# Patient Record
Sex: Male | Born: 1965
Health system: Southern US, Community
[De-identification: ages and names within clinical notes are randomized; demographics above are authoritative.]

## PROBLEM LIST (undated history)

## (undated) DIAGNOSIS — T1590XA Foreign body on external eye, part unspecified, unspecified eye, initial encounter: Secondary | ICD-10-CM

## (undated) DIAGNOSIS — J189 Pneumonia, unspecified organism: Secondary | ICD-10-CM

## (undated) DIAGNOSIS — N182 Chronic kidney disease, stage 2 (mild): Secondary | ICD-10-CM

## (undated) DIAGNOSIS — I10 Essential (primary) hypertension: Secondary | ICD-10-CM

## (undated) DIAGNOSIS — I5022 Chronic systolic (congestive) heart failure: Secondary | ICD-10-CM

---

## 2005-04-24 ENCOUNTER — Emergency Department (HOSPITAL_COMMUNITY): Admission: EM | Admit: 2005-04-24 | Discharge: 2005-04-24 | Payer: Self-pay | Admitting: Emergency Medicine

## 2007-09-19 DIAGNOSIS — T1590XA Foreign body on external eye, part unspecified, unspecified eye, initial encounter: Secondary | ICD-10-CM

## 2007-09-19 HISTORY — PX: EYE SURGERY: SHX253

## 2007-09-19 HISTORY — DX: Foreign body on external eye, part unspecified, unspecified eye, initial encounter: T15.90XA

## 2007-12-03 ENCOUNTER — Emergency Department (HOSPITAL_COMMUNITY): Admission: EM | Admit: 2007-12-03 | Discharge: 2007-12-03 | Payer: Self-pay | Admitting: Family Medicine

## 2007-12-24 ENCOUNTER — Ambulatory Visit: Payer: Self-pay | Admitting: Internal Medicine

## 2007-12-25 ENCOUNTER — Ambulatory Visit: Payer: Self-pay | Admitting: *Deleted

## 2008-01-16 ENCOUNTER — Ambulatory Visit: Payer: Self-pay | Admitting: Internal Medicine

## 2008-01-16 LAB — CONVERTED CEMR LAB
HDL: 41 mg/dL (ref >39)
Triglycerides: 70 mg/dL (ref <150)
VLDL: 14 mg/dL (ref 0–40)

## 2008-01-24 ENCOUNTER — Ambulatory Visit: Payer: Self-pay | Admitting: Internal Medicine

## 2008-02-21 ENCOUNTER — Ambulatory Visit: Payer: Self-pay | Admitting: Internal Medicine

## 2008-03-05 ENCOUNTER — Encounter: Payer: Self-pay | Admitting: Family Medicine

## 2008-03-05 ENCOUNTER — Ambulatory Visit: Payer: Self-pay | Admitting: Internal Medicine

## 2008-03-05 LAB — CONVERTED CEMR LAB
ALT: 13 units/L (ref 0–53)
AST: 15 units/L (ref 0–37)
Albumin: 4.2 g/dL (ref 3.5–5.2)
BUN: 13 mg/dL (ref 6–23)
Basophils Absolute: 0 10*3/uL (ref 0.0–0.1)
Basophils Relative: 0 % (ref 0–1)
Chloride: 98 meq/L (ref 96–112)
Creatinine, Ser: 1.18 mg/dL (ref 0.40–1.50)
Eosinophils Absolute: 0.1 10*3/uL (ref 0.0–0.7)
Glucose, Bld: 148 mg/dL — ABNORMAL HIGH (ref 70–99)
Hemoglobin: 13.4 g/dL (ref 13.0–17.0)
MCHC: 33.7 g/dL (ref 30.0–36.0)
MCV: 75.1 fL — ABNORMAL LOW (ref 78.0–100.0)
Monocytes Absolute: 0.3 10*3/uL (ref 0.1–1.0)
Monocytes Relative: 6 % (ref 3–12)
Neutro Abs: 2.6 10*3/uL (ref 1.7–7.7)
RDW: 14.6 % (ref 11.5–15.5)

## 2008-03-13 ENCOUNTER — Ambulatory Visit: Payer: Self-pay | Admitting: Internal Medicine

## 2008-07-27 ENCOUNTER — Ambulatory Visit: Payer: Self-pay | Admitting: Internal Medicine

## 2008-09-07 ENCOUNTER — Ambulatory Visit: Payer: Self-pay | Admitting: Internal Medicine

## 2008-11-16 ENCOUNTER — Ambulatory Visit: Payer: Self-pay | Admitting: Internal Medicine

## 2008-11-17 ENCOUNTER — Encounter: Payer: Self-pay | Admitting: Family Medicine

## 2008-11-17 ENCOUNTER — Ambulatory Visit: Payer: Self-pay | Admitting: Internal Medicine

## 2008-11-17 LAB — CONVERTED CEMR LAB
ALT: 20 units/L (ref 0–53)
AST: 18 units/L (ref 0–37)
Albumin: 4.6 g/dL (ref 3.5–5.2)
Calcium: 9.3 mg/dL (ref 8.4–10.5)
Chloride: 95 meq/L — ABNORMAL LOW (ref 96–112)
Creatinine, Ser: 1.13 mg/dL (ref 0.40–1.50)
Potassium: 3.9 meq/L (ref 3.5–5.3)

## 2008-11-25 ENCOUNTER — Ambulatory Visit: Payer: Self-pay | Admitting: Internal Medicine

## 2010-04-05 ENCOUNTER — Ambulatory Visit: Payer: Self-pay | Admitting: Family Medicine

## 2010-04-19 ENCOUNTER — Ambulatory Visit: Payer: Self-pay | Admitting: Internal Medicine

## 2010-04-19 LAB — CONVERTED CEMR LAB
Glucose, Bld: 117 mg/dL — ABNORMAL HIGH (ref 70–99)
Sodium: 134 meq/L — ABNORMAL LOW (ref 135–145)

## 2010-05-03 ENCOUNTER — Encounter (INDEPENDENT_AMBULATORY_CARE_PROVIDER_SITE_OTHER): Payer: Self-pay | Admitting: Internal Medicine

## 2010-05-03 ENCOUNTER — Ambulatory Visit: Payer: Self-pay | Admitting: Internal Medicine

## 2010-05-06 ENCOUNTER — Ambulatory Visit: Payer: Self-pay | Admitting: Internal Medicine

## 2010-05-06 LAB — CONVERTED CEMR LAB
ALT: 25 units/L (ref 0–53)
Albumin: 4.4 g/dL (ref 3.5–5.2)
Basophils Absolute: 0 10*3/uL (ref 0.0–0.1)
Calcium: 9.1 mg/dL (ref 8.4–10.5)
Creatinine, Ser: 1.16 mg/dL (ref 0.40–1.50)
Eosinophils Absolute: 0.1 10*3/uL (ref 0.0–0.7)
Eosinophils Relative: 1 % (ref 0–5)
HCT: 41.4 % (ref 39.0–52.0)
Hemoglobin: 14.7 g/dL (ref 13.0–17.0)
Lymphocytes Relative: 36 % (ref 12–46)
Lymphs Abs: 1.9 10*3/uL (ref 0.7–4.0)
MCHC: 35.5 g/dL (ref 30.0–36.0)
Monocytes Absolute: 0.3 10*3/uL (ref 0.1–1.0)
Monocytes Relative: 5 % (ref 3–12)
Neutro Abs: 3 10*3/uL (ref 1.7–7.7)
Neutrophils Relative %: 58 % (ref 43–77)
Potassium: 3.6 meq/L (ref 3.5–5.3)
Sodium: 133 meq/L — ABNORMAL LOW (ref 135–145)
TSH: 2.065 microintl units/mL (ref 0.350–4.500)
Total Protein: 7.2 g/dL (ref 6.0–8.3)
WBC: 5.2 10*3/uL (ref 4.0–10.5)

## 2012-09-08 ENCOUNTER — Other Ambulatory Visit: Payer: Self-pay | Admitting: *Deleted

## 2012-09-08 MED ORDER — LISINOPRIL-HYDROCHLOROTHIAZIDE 20-12.5 MG PO TABS: 2.0000 | ORAL_TABLET | Freq: Every day | ORAL | Status: DC

## 2012-09-17 ENCOUNTER — Encounter (HOSPITAL_COMMUNITY): Payer: Self-pay | Admitting: *Deleted

## 2012-09-17 ENCOUNTER — Emergency Department (HOSPITAL_COMMUNITY)
Admission: EM | Admit: 2012-09-17 | Discharge: 2012-09-18 | Disposition: A | Discharge status: Home / Self Care | Payer: No Typology Code available for payment source | Attending: Emergency Medicine | Admitting: Emergency Medicine

## 2012-09-17 DIAGNOSIS — I10 Essential (primary) hypertension: Secondary | ICD-10-CM

## 2012-09-17 DIAGNOSIS — Z9119 Patient's noncompliance with other medical treatment and regimen: Secondary | ICD-10-CM | POA: Insufficient documentation

## 2012-09-17 DIAGNOSIS — M25512 Pain in left shoulder: Secondary | ICD-10-CM

## 2012-09-17 DIAGNOSIS — Z91148 Patient's other noncompliance with medication regimen for other reason: Secondary | ICD-10-CM

## 2012-09-17 DIAGNOSIS — S46909A Unspecified injury of unspecified muscle, fascia and tendon at shoulder and upper arm level, unspecified arm, initial encounter: Secondary | ICD-10-CM | POA: Insufficient documentation

## 2012-09-17 DIAGNOSIS — M545 Low back pain, unspecified: Secondary | ICD-10-CM

## 2012-09-17 DIAGNOSIS — Z9114 Patient's other noncompliance with medication regimen: Secondary | ICD-10-CM

## 2012-09-17 DIAGNOSIS — S4980XA Other specified injuries of shoulder and upper arm, unspecified arm, initial encounter: Secondary | ICD-10-CM | POA: Insufficient documentation

## 2012-09-17 DIAGNOSIS — IMO0002 Reserved for concepts with insufficient information to code with codable children: Secondary | ICD-10-CM | POA: Insufficient documentation

## 2012-09-17 DIAGNOSIS — Z91199 Patient's noncompliance with other medical treatment and regimen due to unspecified reason: Secondary | ICD-10-CM | POA: Insufficient documentation

## 2012-09-17 DIAGNOSIS — Y92410 Unspecified street and highway as the place of occurrence of the external cause: Secondary | ICD-10-CM | POA: Insufficient documentation

## 2012-09-17 DIAGNOSIS — R000 Tachycardia, unspecified: Secondary | ICD-10-CM | POA: Insufficient documentation

## 2012-09-17 DIAGNOSIS — Y9389 Activity, other specified: Secondary | ICD-10-CM | POA: Insufficient documentation

## 2012-09-17 HISTORY — DX: Essential (primary) hypertension: I10

## 2012-09-17 MED ORDER — LISINOPRIL 10 MG PO TABS
10.0000 mg | ORAL_TABLET | ORAL | Status: AC
  Administered 2012-09-18: 10 mg via ORAL
  Filled 2012-09-17: qty 1

## 2012-09-17 MED ORDER — HYDROCHLOROTHIAZIDE 12.5 MG PO CAPS
12.5000 mg | ORAL_CAPSULE | ORAL | Status: AC
  Administered 2012-09-18: 12.5 mg via ORAL
  Filled 2012-09-17: qty 1

## 2012-09-17 MED ORDER — ACETAMINOPHEN 325 MG PO TABS
650.0000 mg | ORAL_TABLET | ORAL | Status: AC
  Administered 2012-09-18: 650 mg via ORAL
  Filled 2012-09-17: qty 2
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
## 2012-09-18 ENCOUNTER — Emergency Department (HOSPITAL_COMMUNITY): Payer: No Typology Code available for payment source

## 2012-09-18 MED ORDER — HYDROCHLOROTHIAZIDE 12.5 MG PO CAPS: 12.5000 mg | ORAL_CAPSULE | Freq: Two times a day (BID) | ORAL | Status: DC

## 2012-09-18 MED ORDER — LISINOPRIL 10 MG PO TABS: 10.0000 mg | ORAL_TABLET | Freq: Every day | ORAL | Status: DC

## 2012-09-18 NOTE — ED Notes (Signed)
C Collar and short back board removed by Dr. Norlene Campbell
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
## 2013-01-07 ENCOUNTER — Emergency Department (HOSPITAL_COMMUNITY)
Admission: EM | Admit: 2013-01-07 | Discharge: 2013-01-07 | Disposition: A | Discharge status: Home / Self Care | Payer: Self-pay | Source: Home / Self Care

## 2013-05-06 ENCOUNTER — Encounter (HOSPITAL_COMMUNITY): Payer: Self-pay | Admitting: *Deleted

## 2013-05-06 ENCOUNTER — Emergency Department (HOSPITAL_COMMUNITY)
Admission: EM | Admit: 2013-05-06 | Discharge: 2013-05-07 | Disposition: A | Discharge status: Home / Self Care | Payer: Self-pay | Attending: Emergency Medicine | Admitting: Emergency Medicine

## 2013-05-06 ENCOUNTER — Emergency Department (INDEPENDENT_AMBULATORY_CARE_PROVIDER_SITE_OTHER)
Admission: EM | Admit: 2013-05-06 | Discharge: 2013-05-06 | Disposition: A | Discharge status: Nursing Home (Includes ICF) | Payer: Self-pay | Source: Home / Self Care | Attending: Emergency Medicine | Admitting: Emergency Medicine

## 2013-05-06 DIAGNOSIS — I10 Essential (primary) hypertension: Secondary | ICD-10-CM | POA: Insufficient documentation

## 2013-05-06 DIAGNOSIS — E875 Hyperkalemia: Secondary | ICD-10-CM

## 2013-05-06 DIAGNOSIS — E876 Hypokalemia: Secondary | ICD-10-CM | POA: Insufficient documentation

## 2013-05-06 LAB — POCT I-STAT, CHEM 8
Calcium, Ion: 1.18 mmol/L (ref 1.12–1.23)
Hemoglobin: 16.7 g/dL (ref 13.0–17.0)
Sodium: 135 mEq/L (ref 135–145)
TCO2: 29 mmol/L (ref 0–100)

## 2013-05-06 MED ORDER — CLONIDINE HCL 0.1 MG PO TABS
ORAL_TABLET | ORAL | Status: AC
  Filled 2013-05-06: qty 2

## 2013-05-06 MED ORDER — CLONIDINE HCL 0.1 MG PO TABS: 0.3000 mg | ORAL_TABLET | Freq: Once | ORAL | Status: DC

## 2013-05-06 MED ORDER — POTASSIUM CHLORIDE CRYS ER 20 MEQ PO TBCR
40.0000 meq | EXTENDED_RELEASE_TABLET | Freq: Once | ORAL | Status: AC
  Administered 2013-05-07: 40 meq via ORAL
  Filled 2013-05-06: qty 2

## 2013-05-06 MED ORDER — CLONIDINE HCL 0.1 MG PO TABS
0.2000 mg | ORAL_TABLET | Freq: Once | ORAL | Status: AC
  Administered 2013-05-06: 0.2 mg via ORAL

## 2013-05-06 MED ORDER — FUROSEMIDE 40 MG PO TABS: 40.0000 mg | ORAL_TABLET | Freq: Once | ORAL | Status: DC

## 2013-05-06 NOTE — ED Provider Notes (Addendum)
Chief Complaint:   Chief Complaint  Patient presents with  . Medication Refill    History of Present Illness:   Austin Mccarty is a 47 year old male with 2 year history of high blood pressure. He had been on [facility]. He's not seen him since that clinic closed. The patient states he has not taken any medications at all for the past 3 months. He feels tired and rundown but otherwise has had no other symptoms. Specifically he denies headaches, dizziness, blurry vision, shortness of breath, chest pain, tightness, pressure, palpitations, or ankle edema. He has had no history of diabetes, hypercholesterolemia, kidney disease, stroke, or heart disease.  Review of Systems:  Other than noted above, the patient denies any of the following symptoms: Systemic:  No fever, chills, fatigue, weight loss or gain. Respiratory:  No coughing, wheezing, or shortness of breath. Cardiac:  No chest pain, tightness, pressure, palpitations, syncope, or edema. Neuro:  No headache, dizziness, blurred vision, weakness, paresthesias, or strokelike symptoms.  PMFSH:  Past medical history, family history, social history, meds, and allergies were reviewed.  Physical Exam:   Vital signs:  BP 212/102  Pulse 83  Temp(Src) 99 F (37.2 C) (Oral)  Resp 18  SpO2 97% General:  Alert, oriented, in no distress. Lungs:  Breath sounds clear and equal bilaterally.  No wheezes, rales, or rhonchi. Heart:  Regular rhythm, no gallops, murmers, clicks or rubs.  Abdomen:  Soft and flat.  Nontender, no organomegaly or mass.  No pulsatile midline abdominal mass or bruit. Ext:  No edema, pulses full. Neurological exam:  Alert and oriented.  Speech is clear.  No pronator drift.  CNs intact.  Labs:   Results for orders placed during the hospital encounter of 05/06/13  POCT I-STAT, CHEM 8      Result Value Range   Sodium 135  135 - 145 mEq/L   Potassium 2.9 (*) 3.5 - 5.1 mEq/L   Chloride  95 (*) 96 - 112 mEq/L   BUN 21  6 - 23 mg/dL   Creatinine, Ser 4.09 (*) 0.50 - 1.35 mg/dL   Glucose, Bld 811 (*) 70 - 99 mg/dL   Calcium, Ion 9.14  7.82 - 1.23 mmol/L   TCO2 29  0 - 100 mmol/L   Hemoglobin 16.7  13.0 - 17.0 g/dL   HCT 95.6  21.3 - 08.6 %     Course in Urgent Care Center:   He was given clonidine 0.2 mg by mouth.  Assessment:  The primary encounter diagnosis was Hypertension. A diagnosis of Hyperkalemia was also pertinent to this visit.  I'm concerned about his markedly elevated blood pressure and low potassium, especially since he is not on any medications that should lower his potassium. I wonder if he could have hyperaldosteronism or some type of kidney problem. His creatinine is increased to 1.40. I think he needs in-hospital repletion of his potassium and workup of his hypokalemia. He does not have any followup and no primary care physician.  Plan:   1.  The following meds were prescribed:   New Prescriptions   No medications on file   2.  The patient was transferred to our emergency department via shuttle in stable condition.  Medical Decision Making: Patient has a 2 year history of HT, had been on Benazaipril/HCTZ  Through [facility], but has not received any medical care since they closed.  He states he's been off of all medication for at least  3 months.  Tonight his blood pressure was very high at 212/106, K 2.9 and Cr 1.4.  I am concerned about the low potassium since he's not been on any meds that would lower his potassium.  May have hyperaldosteronism.  Needs K replacement.  I'm reluctant to just give him a K supplement since he has no follow up. Needs potassium repletion and blood pressure control in hospital setting and workup for low potassium.     Reuben Likes, MD 05/06/13 1949  Reuben Likes, MD 05/06/13 [phone]

## 2013-05-06 NOTE — ED Provider Notes (Signed)
CSN: [id number]     Arrival date & time 05/06/13  2011 History     First MD Initiated Contact with Patient 05/06/13 2305     Chief Complaint  Patient presents with  . hypokalemia    . Hypertension   (Consider location/radiation/quality/duration/timing/severity/associated sxs/prior Treatment) HPI Comments: 47 year old male who was sent to the ER from urgent care hypertension and hypokalemia. Patient reported to the urgent care earlier today in order to refill for his blood pressure medications. He denies any symptoms whatsoever, including chest pain, shortness of breath, abdominal pain, blurry vision, headaches.  Patient is a 47 y.o. male presenting with hypertension.  Hypertension This is a chronic problem. The problem occurs constantly. The problem has been gradually worsening. Pertinent negatives include no chest pain, no abdominal pain, no headaches and no shortness of breath. Nothing aggravates the symptoms. Nothing relieves the symptoms. Treatments tried: ran out of benazapril and HCTZ a few weeks ago.      Past Medical History  Diagnosis Date  . Hypertension    Past Surgical History  Procedure Laterality Date  . Eye surgery     No family history on file. History  Substance Use Topics  . Smoking status: Never Smoker   . Smokeless tobacco: Current User  . Alcohol Use: No    Review of Systems  Constitutional: Negative for fever.  HENT: Negative for congestion.   Respiratory: Negative for cough and shortness of breath.   Cardiovascular: Negative for chest pain.  Gastrointestinal: Negative for nausea, vomiting, abdominal pain and diarrhea.  Neurological: Negative for headaches.  All other systems reviewed and are negative.    Allergies  Ibuprofen  Home Medications  No current outpatient prescriptions on file. BP 182/111  Pulse 69  Temp(Src) 98.4 F (36.9 C) (Oral)  Resp 18  SpO2 100% Physical Exam  Nursing note and vitals reviewed. Constitutional: He is  oriented to person, place, and time. He appears well-developed and well-nourished. No distress.  HENT:  Head: Normocephalic and atraumatic.  Mouth/Throat: Oropharynx is clear and moist.  Eyes: Conjunctivae are normal. Pupils are equal, round, and reactive to light. No scleral icterus.  Neck: Neck supple.  Cardiovascular: Normal rate, regular rhythm, normal heart sounds and intact distal pulses.   No murmur heard. Pulmonary/Chest: Effort normal and breath sounds normal. No stridor. No respiratory distress. He has no wheezes. He has no rales.  Abdominal: Soft. He exhibits no distension. There is no tenderness.  Musculoskeletal: Normal range of motion. He exhibits no edema.  Neurological: He is alert and oriented to person, place, and time.  Dysconjugate gaze is old from a prior right eye surgery  Skin: Skin is warm and dry. No rash noted.  Psychiatric: He has a normal mood and affect. His behavior is normal.    ED Course   Procedures (including critical care time)  Labs Reviewed  BASIC METABOLIC PANEL - Abnormal; Notable for the following:    Sodium 133 (*)    Potassium 2.8 (*)    Chloride 95 (*)    Glucose, Bld 112 (*)    GFR calc non Af Amer 67 (*)    GFR calc Af Amer 77 (*)    All other components within normal limits   No results found.  EKG - sinus, rate 73, normal axis, normal intervals, LVH, ST changes consistent with repolarization abnormality, no priors available.    1. Hypokalemia   2. Hypertension     MDM  Presented from urgent care for HTN  and hypokalemia.  Pt has no symptoms, he just wanted to get a refill of his benazepril.  No signs of acute end organ damage.  Repeat BMP confirmed hypokalemia.  Replaced orally and recommended high potassium diet and close PCP follow up.  He declined further testing, IV K replacement, or inpatient management.    Candyce Churn, MD 05/08/13 [phone]

## 2013-05-06 NOTE — ED Notes (Signed)
Pt has  History  Of  htn  -  He  Is  A  Former  Scientist, water quality  Pt  Who  Has  Not  Been  Compliant       He  Reports  No bp  meds  For  3  Months   -  He  Verbalizes  No  Other  Symptoms

## 2013-05-06 NOTE — ED Notes (Signed)
Pt states that he went to she his doctor, and his labs show low potassium serum level of 2.9

## 2013-05-07 LAB — BASIC METABOLIC PANEL
Calcium: 8.6 mg/dL (ref 8.4–10.5)
GFR calc Af Amer: 77 mL/min — ABNORMAL LOW (ref >90)
Potassium: 2.8 mEq/L — ABNORMAL LOW (ref 3.5–5.1)

## 2013-05-07 MED ORDER — BENAZEPRIL HCL 20 MG PO TABS: 20.0000 mg | ORAL_TABLET | Freq: Every day | ORAL | Status: DC

## 2013-05-21 ENCOUNTER — Ambulatory Visit: Payer: Self-pay | Admitting: Cardiology

## 2014-10-14 ENCOUNTER — Encounter (HOSPITAL_COMMUNITY): Payer: Self-pay

## 2014-10-14 ENCOUNTER — Emergency Department (INDEPENDENT_AMBULATORY_CARE_PROVIDER_SITE_OTHER)
Admission: EM | Admit: 2014-10-14 | Discharge: 2014-10-14 | Disposition: A | Discharge status: Home / Self Care | Payer: Self-pay | Source: Home / Self Care | Attending: Emergency Medicine | Admitting: Emergency Medicine

## 2014-10-14 DIAGNOSIS — J40 Bronchitis, not specified as acute or chronic: Secondary | ICD-10-CM

## 2014-10-14 LAB — POCT RAPID STREP A: Streptococcus, Group A Screen (Direct): NEGATIVE

## 2014-10-14 MED ORDER — AZITHROMYCIN 250 MG PO TABS: ORAL_TABLET | ORAL | Status: DC

## 2014-10-14 NOTE — Discharge Instructions (Signed)
You have bronchitis. Take azithromycin as prescribed. Follow-up if you are not improved after finishing the medication.

## 2014-10-14 NOTE — ED Provider Notes (Signed)
CSN: [id number]     Arrival date & time 10/14/14  1533 History   None    Chief Complaint  Patient presents with  . Sore Throat   (Consider location/radiation/quality/duration/timing/severity/associated sxs/prior Treatment) HPI  He is a 49 year old man here for evaluation of sore throat. This started about one week ago. It is associated with cough. No nasal discharge or rhinorrhea. No fevers or chills. No nausea or vomiting. He states his son was sick with similar symptoms and improved after an antibiotic.   Past Medical History  Diagnosis Date  . Hypertension    Past Surgical History  Procedure Laterality Date  . Eye surgery     History reviewed. No pertinent family history. History  Substance Use Topics  . Smoking status: Never Smoker   . Smokeless tobacco: Current User  . Alcohol Use: No    Review of Systems As in history of present illness Allergies  Ibuprofen  Home Medications   Prior to Admission medications   Medication Sig Start Date End Date Taking? Authorizing Provider  azithromycin (ZITHROMAX Z-PAK) 250 MG tablet Take 2 pills today, then 1 pill daily until gone 10/14/14   Charm RingsErin J Demetre Monaco, MD  benazepril (LOTENSIN) 20 MG tablet Take 1 tablet (20 mg total) by mouth daily. 05/07/13   Candyce ChurnJohn David Wofford III, MD   BP 185/100 mmHg  Pulse 86  Temp(Src) 98.3 F (36.8 C) (Oral)  Resp 16  SpO2 98% Physical Exam  Constitutional: He is oriented to person, place, and time. He appears well-developed and well-nourished. No distress.  HENT:  Head: Normocephalic and atraumatic.  Right Ear: Tympanic membrane normal.  Left Ear: Tympanic membrane normal.  Nose: Nose normal.  Mouth/Throat: Posterior oropharyngeal erythema present. No oropharyngeal exudate.  Neck: Neck supple.  Cardiovascular: Normal rate and normal heart sounds.   No murmur heard. Pulmonary/Chest: Effort normal and breath sounds normal. No respiratory distress. He has no wheezes. He has no rales.   Lymphadenopathy:    He has no cervical adenopathy.  Neurological: He is alert and oriented to person, place, and time.    ED Course  Procedures (including critical care time) Labs Review Labs Reviewed  POCT RAPID STREP A ([facility] ONLY)    Imaging Review No results found.   MDM   1. Bronchitis    We'll treat with azithromycin. Follow-up as needed.    Charm RingsErin J Danielle Mink, MD 10/14/14 [phone]

## 2014-10-14 NOTE — ED Notes (Signed)
C/o sore throat x 1 week.

## 2014-10-16 LAB — CULTURE, GROUP A STREP

## 2015-01-07 ENCOUNTER — Inpatient Hospital Stay (HOSPITAL_BASED_OUTPATIENT_CLINIC_OR_DEPARTMENT_OTHER)
Admission: EM | Admit: 2015-01-07 | Discharge: 2015-01-09 | DRG: 871 | Disposition: A | Discharge status: Home / Self Care | Payer: Self-pay | Attending: Internal Medicine | Admitting: Internal Medicine

## 2015-01-07 ENCOUNTER — Emergency Department (HOSPITAL_BASED_OUTPATIENT_CLINIC_OR_DEPARTMENT_OTHER): Payer: Self-pay

## 2015-01-07 ENCOUNTER — Encounter (HOSPITAL_BASED_OUTPATIENT_CLINIC_OR_DEPARTMENT_OTHER): Payer: Self-pay | Admitting: *Deleted

## 2015-01-07 DIAGNOSIS — E876 Hypokalemia: Secondary | ICD-10-CM | POA: Diagnosis present

## 2015-01-07 DIAGNOSIS — N189 Chronic kidney disease, unspecified: Secondary | ICD-10-CM | POA: Diagnosis present

## 2015-01-07 DIAGNOSIS — S2239XA Fracture of one rib, unspecified side, initial encounter for closed fracture: Secondary | ICD-10-CM | POA: Diagnosis present

## 2015-01-07 DIAGNOSIS — E669 Obesity, unspecified: Secondary | ICD-10-CM | POA: Diagnosis present

## 2015-01-07 DIAGNOSIS — J189 Pneumonia, unspecified organism: Secondary | ICD-10-CM | POA: Diagnosis present

## 2015-01-07 DIAGNOSIS — R05 Cough: Secondary | ICD-10-CM

## 2015-01-07 DIAGNOSIS — E871 Hypo-osmolality and hyponatremia: Secondary | ICD-10-CM | POA: Diagnosis present

## 2015-01-07 DIAGNOSIS — E878 Other disorders of electrolyte and fluid balance, not elsewhere classified: Secondary | ICD-10-CM

## 2015-01-07 DIAGNOSIS — A419 Sepsis, unspecified organism: Principal | ICD-10-CM | POA: Diagnosis present

## 2015-01-07 DIAGNOSIS — I129 Hypertensive chronic kidney disease with stage 1 through stage 4 chronic kidney disease, or unspecified chronic kidney disease: Secondary | ICD-10-CM | POA: Diagnosis present

## 2015-01-07 DIAGNOSIS — R059 Cough, unspecified: Secondary | ICD-10-CM

## 2015-01-07 DIAGNOSIS — N182 Chronic kidney disease, stage 2 (mild): Secondary | ICD-10-CM | POA: Diagnosis present

## 2015-01-07 DIAGNOSIS — Z888 Allergy status to other drugs, medicaments and biological substances status: Secondary | ICD-10-CM

## 2015-01-07 DIAGNOSIS — Z6826 Body mass index (BMI) 26.0-26.9, adult: Secondary | ICD-10-CM

## 2015-01-07 DIAGNOSIS — N179 Acute kidney failure, unspecified: Secondary | ICD-10-CM | POA: Diagnosis present

## 2015-01-07 HISTORY — DX: Chronic kidney disease, stage 2 (mild): N18.2

## 2015-01-07 HISTORY — DX: Pneumonia, unspecified organism: J18.9

## 2015-01-07 HISTORY — DX: Foreign body on external eye, part unspecified, unspecified eye, initial encounter: T15.90XA

## 2015-01-07 LAB — CBC
HCT: 35.7 % — ABNORMAL LOW (ref 39.0–52.0)
HEMOGLOBIN: 12.4 g/dL — AB (ref 13.0–17.0)
MCH: 23.9 pg — AB (ref 26.0–34.0)
MCHC: 34.7 g/dL (ref 30.0–36.0)
MCV: 68.8 fL — AB (ref 78.0–100.0)
Platelets: 328 10*3/uL (ref 150–400)
RBC: 5.19 MIL/uL (ref 4.22–5.81)
RDW: 14.7 % (ref 11.5–15.5)
WBC: 12.5 10*3/uL — ABNORMAL HIGH (ref 4.0–10.5)

## 2015-01-07 LAB — BASIC METABOLIC PANEL
ANION GAP: 10 (ref 5–15)
BUN: 23 mg/dL (ref 6–23)
CHLORIDE: 89 mmol/L — AB (ref 96–112)
CO2: 29 mmol/L (ref 19–32)
CREATININE: 1.58 mg/dL — AB (ref 0.50–1.35)
Calcium: 8.1 mg/dL — ABNORMAL LOW (ref 8.4–10.5)
GFR calc Af Amer: 59 mL/min — ABNORMAL LOW (ref >90)
GFR calc non Af Amer: 51 mL/min — ABNORMAL LOW (ref >90)
GLUCOSE: 107 mg/dL — AB (ref 70–99)
POTASSIUM: 2.9 mmol/L — AB (ref 3.5–5.1)
Sodium: 128 mmol/L — ABNORMAL LOW (ref 135–145)

## 2015-01-07 LAB — BRAIN NATRIURETIC PEPTIDE: B Natriuretic Peptide: 551.8 pg/mL — ABNORMAL HIGH (ref 0.0–100.0)

## 2015-01-07 MED ORDER — CIPROFLOXACIN IN D5W 400 MG/200ML IV SOLN
400.0000 mg | Freq: Once | INTRAVENOUS | Status: AC
  Administered 2015-01-07: 400 mg via INTRAVENOUS
  Filled 2015-01-07: qty 200

## 2015-01-07 MED ORDER — HYDROCOD POLST-CPM POLST ER 10-8 MG/5ML PO SUER
ORAL | Status: AC
  Administered 2015-01-07: 5 mL
  Filled 2015-01-07: qty 5

## 2015-01-07 MED ORDER — SODIUM CHLORIDE 0.9 % IV BOLUS (SEPSIS)
1000.0000 mL | Freq: Once | INTRAVENOUS | Status: AC
  Administered 2015-01-07: 1000 mL via INTRAVENOUS

## 2015-01-07 MED ORDER — LEVOFLOXACIN IN D5W 750 MG/150ML IV SOLN
750.0000 mg | Freq: Once | INTRAVENOUS | Status: DC
  Administered 2015-01-07: 750 mg via INTRAVENOUS
  Filled 2015-01-07: qty 150

## 2015-01-07 NOTE — ED Notes (Signed)
Cough and fever for 2 months. He has been seen by his MD and given antibiotics for allergies with no improvement.

## 2015-01-07 NOTE — ED Provider Notes (Signed)
CSN: [id number]     Arrival date & time 01/07/15  1717 History  This chart was scribed for Elwin Mocha, MD by Gwenyth Ober, ED Scribe. This patient was seen in room MH03/MH03 and the patient's care was started at [facility].    Chief Complaint  Patient presents with  . Cough   Patient is a 49 y.o. male presenting with cough. The history is provided by the patient. No language interpreter was used.  Cough Cough characteristics:  Productive Sputum characteristics:  White Severity:  Moderate Onset quality:  Gradual Duration:  8 weeks Timing:  Intermittent Progression:  Unchanged Chronicity:  New Smoker: no   Relieved by:  Nothing Worsened by:  Nothing tried Ineffective treatments: Zithromax. Associated symptoms: fever and rhinorrhea   Associated symptoms: no chest pain     HPI Comments: Austin Mccarty is a 49 y.o. male with a history of HTN who presents to the Emergency Department complaining of intermittent, moderate productive cough with white sputum that started 2 months ago. Pt states fever and rhinorrhea as associated symptoms. He has tried a natural inhaler with no relief. Pt was evaluated by his PCP 2 months ago who prescribed Zithromax. He states symptoms improved briefly before worsening again. Pt does not take any regular medication. He denies a history of surgeries and tobacco use. Pt also denies CP, nausea and vomiting as associated symptoms.  Past Medical History  Diagnosis Date  . Hypertension    Past Surgical History  Procedure Laterality Date  . Eye surgery     No family history on file. History  Substance Use Topics  . Smoking status: Never Smoker   . Smokeless tobacco: Current User  . Alcohol Use: No    Review of Systems  Constitutional: Positive for fever.  HENT: Positive for rhinorrhea.   Respiratory: Positive for cough.   Cardiovascular: Negative for chest pain.  Gastrointestinal: Negative for nausea and vomiting.  All other systems reviewed and  are negative.  Allergies  Ibuprofen  Home Medications   Prior to Admission medications   Medication Sig Start Date End Date Taking? Authorizing Provider  azithromycin (ZITHROMAX Z-PAK) 250 MG tablet Take 2 pills today, then 1 pill daily until gone 10/14/14   Charm Rings, MD  benazepril (LOTENSIN) 20 MG tablet Take 1 tablet (20 mg total) by mouth daily. 05/07/13   Blake Divine, MD   BP 185/99 mmHg  Pulse 105  Temp(Src) 99.9 F (37.7 C) (Oral)  Resp 20  Ht  (1.854 m)  Wt 200 lb (90.719 kg)  BMI 26.39 kg/m2  SpO2 92% Physical Exam  Constitutional: He appears well-developed and well-nourished. No distress.  HENT:  Head: Normocephalic and atraumatic.  Eyes: Conjunctivae and EOM are normal.  Neck: Neck supple. No tracheal deviation present.  Cardiovascular: Normal rate.   Pulmonary/Chest: Effort normal. No respiratory distress.  Skin: Skin is warm and dry. No rash noted.  Psychiatric: He has a normal mood and affect. His behavior is normal.  Nursing note and vitals reviewed.   ED Course  Procedures   DIAGNOSTIC STUDIES: Oxygen Saturation is 92% on RA, low by my interpretation.    COORDINATION OF CARE: 6:47 PM Discussed treatment plan with pt which includes a chest x-ray. Pt agreed to plan.  7:46 PM Discussed x-ray results and admission with pt. Will order lab work. Pt agreed to plan.   Labs Review Labs Reviewed  CBC - Abnormal; Notable for the following:    WBC 12.5 (*)  Hemoglobin 12.4 (*)    HCT 35.7 (*)    MCV 68.8 (*)    MCH 23.9 (*)    All other components within normal limits  BASIC METABOLIC PANEL - Abnormal; Notable for the following:    Sodium 128 (*)    Potassium 2.9 (*)    Chloride 89 (*)    Glucose, Bld 107 (*)    Creatinine, Ser 1.58 (*)    Calcium 8.1 (*)    GFR calc non Af Amer 51 (*)    GFR calc Af Amer 59 (*)    All other components within normal limits  BRAIN NATRIURETIC PEPTIDE - Abnormal; Notable for the following:    B Natriuretic  Peptide 551.8 (*)    All other components within normal limits  CULTURE, BLOOD (ROUTINE X 2)  CULTURE, BLOOD (ROUTINE X 2)  LEGIONELLA ANTIGEN, URINE    Imaging Review Dg Chest 2 View  01/07/2015   CLINICAL DATA:  Intermittent moderate productive cough with fever.  EXAM: CHEST  2 VIEW  COMPARISON:  None.  FINDINGS: Borderline enlarged cardiac silhouette and mediastinal contours. There are extensive ill-defined heterogeneous airspace opacities about the bilateral pulmonary hila with relative sparing of the long periphery. No pleural effusion or pneumothorax. No acute osseus abnormalities.  IMPRESSION: Extensive perihilar predominant heterogeneous airspace opacities - differential considerations are broad and include perihilar alveolar pulmonary edema and inhalational injury, though multifocal infection, including atypical etiologies could have a similar appearance. A follow-up chest radiograph in 3 to 4 weeks after treatment is recommended to ensure resolution.   Electronically Signed   By: Simonne ComeJohn  Watts M.D.   On: 01/07/2015 19:18     EKG Interpretation None      MDM   Final diagnoses:  Cough  Community acquired pneumonia  Hyponatremia  Hypokalemia  Hypochloremia    [id number] year old male here with cough for 2 months. Was originally prescribed Zithromax but has not improved since then. Cough productive of white sputum. He is not a smoker and only has history of hypertension. He tried a natural inhaler that was over-the-counter but it did not improve his symptoms. No vomiting, chest pain, shortness of breath. Here lungs are clear. Will x-ray his chest. Xray shows multifocal pneumonia. Labs show hyponatremia, hypokalemia, hypochloremia. Plan for admission. Cipro given per Dr. Sharyon MedicusHijazi request in case this is Legionella.  I personally performed the services described in this documentation, which was scribed in my presence. The recorded information has been reviewed and is accurate.  \   Elwin MochaBlair  Katleen Carraway, MD 01/08/15 [phone]

## 2015-01-08 ENCOUNTER — Inpatient Hospital Stay (HOSPITAL_COMMUNITY): Payer: Self-pay

## 2015-01-08 ENCOUNTER — Encounter (HOSPITAL_COMMUNITY): Payer: Self-pay | Admitting: Internal Medicine

## 2015-01-08 DIAGNOSIS — E876 Hypokalemia: Secondary | ICD-10-CM | POA: Diagnosis present

## 2015-01-08 DIAGNOSIS — I10 Essential (primary) hypertension: Secondary | ICD-10-CM

## 2015-01-08 DIAGNOSIS — N179 Acute kidney failure, unspecified: Secondary | ICD-10-CM

## 2015-01-08 DIAGNOSIS — J189 Pneumonia, unspecified organism: Secondary | ICD-10-CM

## 2015-01-08 DIAGNOSIS — N189 Chronic kidney disease, unspecified: Secondary | ICD-10-CM

## 2015-01-08 DIAGNOSIS — A419 Sepsis, unspecified organism: Principal | ICD-10-CM

## 2015-01-08 HISTORY — DX: Pneumonia, unspecified organism: J18.9

## 2015-01-08 LAB — INFLUENZA PANEL BY PCR (TYPE A & B)
H1N1 flu by pcr: NOT DETECTED
INFLBPCR: NEGATIVE
Influenza A By PCR: NEGATIVE

## 2015-01-08 LAB — LACTIC ACID, PLASMA
LACTIC ACID, VENOUS: 0.8 mmol/L (ref 0.5–2.0)
Lactic Acid, Venous: 0.8 mmol/L (ref 0.5–2.0)

## 2015-01-08 LAB — URINALYSIS, ROUTINE W REFLEX MICROSCOPIC
Bilirubin Urine: NEGATIVE
GLUCOSE, UA: NEGATIVE mg/dL
KETONES UR: NEGATIVE mg/dL
Leukocytes, UA: NEGATIVE
NITRITE: NEGATIVE
Protein, ur: 30 mg/dL — AB
Specific Gravity, Urine: 1.01 (ref 1.005–1.030)
Urobilinogen, UA: 0.2 mg/dL (ref 0.0–1.0)
pH: 7 (ref 5.0–8.0)

## 2015-01-08 LAB — D-DIMER, QUANTITATIVE: D-Dimer, Quant: 0.47 ug/mL-FEU (ref 0.00–0.48)

## 2015-01-08 LAB — BASIC METABOLIC PANEL
ANION GAP: 10 (ref 5–15)
BUN: 17 mg/dL (ref 6–23)
CHLORIDE: 91 mmol/L — AB (ref 96–112)
CO2: 28 mmol/L (ref 19–32)
Calcium: 7.9 mg/dL — ABNORMAL LOW (ref 8.4–10.5)
Creatinine, Ser: 1.32 mg/dL (ref 0.50–1.35)
GFR calc Af Amer: 73 mL/min — ABNORMAL LOW (ref >90)
GFR calc non Af Amer: 63 mL/min — ABNORMAL LOW (ref >90)
GLUCOSE: 126 mg/dL — AB (ref 70–99)
Potassium: 2.8 mmol/L — ABNORMAL LOW (ref 3.5–5.1)
SODIUM: 129 mmol/L — AB (ref 135–145)

## 2015-01-08 LAB — MAGNESIUM: Magnesium: 1.7 mg/dL (ref 1.5–2.5)

## 2015-01-08 LAB — PROTIME-INR
INR: 1.36 (ref 0.00–1.49)
INR: 1.4 (ref 0.00–1.49)
Prothrombin Time: 16.9 seconds — ABNORMAL HIGH (ref 11.6–15.2)
Prothrombin Time: 17.3 seconds — ABNORMAL HIGH (ref 11.6–15.2)

## 2015-01-08 LAB — APTT: APTT: 41 s — AB (ref 24–37)

## 2015-01-08 LAB — CBC
HCT: 33.1 % — ABNORMAL LOW (ref 39.0–52.0)
Hemoglobin: 11.3 g/dL — ABNORMAL LOW (ref 13.0–17.0)
MCH: 23.6 pg — AB (ref 26.0–34.0)
MCHC: 34.1 g/dL (ref 30.0–36.0)
MCV: 69.1 fL — ABNORMAL LOW (ref 78.0–100.0)
PLATELETS: 269 10*3/uL (ref 150–400)
RBC: 4.79 MIL/uL (ref 4.22–5.81)
RDW: 14.5 % (ref 11.5–15.5)
WBC: 9.5 10*3/uL (ref 4.0–10.5)

## 2015-01-08 LAB — RAPID URINE DRUG SCREEN, HOSP PERFORMED
AMPHETAMINES: NOT DETECTED
Barbiturates: NOT DETECTED
Benzodiazepines: NOT DETECTED
Cocaine: NOT DETECTED
OPIATES: POSITIVE — AB
Tetrahydrocannabinol: NOT DETECTED

## 2015-01-08 LAB — HIV ANTIBODY (ROUTINE TESTING W REFLEX): HIV Screen 4th Generation wRfx: NONREACTIVE

## 2015-01-08 LAB — URINE MICROSCOPIC-ADD ON

## 2015-01-08 LAB — PROCALCITONIN: Procalcitonin: 0.27 ng/mL

## 2015-01-08 LAB — STREP PNEUMONIAE URINARY ANTIGEN: Strep Pneumo Urinary Antigen: NEGATIVE

## 2015-01-08 LAB — SODIUM, URINE, RANDOM: Sodium, Ur: 53 mmol/L

## 2015-01-08 LAB — CREATININE, URINE, RANDOM: Creatinine, Urine: 56.07 mg/dL

## 2015-01-08 MED ORDER — HYDRALAZINE HCL 20 MG/ML IJ SOLN
5.0000 mg | INTRAMUSCULAR | Status: DC | PRN
  Filled 2015-01-08: qty 1

## 2015-01-08 MED ORDER — HYDRALAZINE HCL 20 MG/ML IJ SOLN
10.0000 mg | Freq: Four times a day (QID) | INTRAMUSCULAR | Status: DC | PRN
  Administered 2015-01-08: 10 mg via INTRAVENOUS
  Filled 2015-01-08: qty 1

## 2015-01-08 MED ORDER — SODIUM CHLORIDE 0.9 % IV SOLN
INTRAVENOUS | Status: DC
  Administered 2015-01-08: 03:00:00 via INTRAVENOUS

## 2015-01-08 MED ORDER — DEXTROSE 5 % IV SOLN
500.0000 mg | INTRAVENOUS | Status: DC
  Administered 2015-01-08: 500 mg via INTRAVENOUS
  Filled 2015-01-08 (×2): qty 500

## 2015-01-08 MED ORDER — AMLODIPINE BESYLATE 10 MG PO TABS
10.0000 mg | ORAL_TABLET | Freq: Every day | ORAL | Status: DC
  Administered 2015-01-09: 10 mg via ORAL
  Filled 2015-01-08: qty 1

## 2015-01-08 MED ORDER — ACETAMINOPHEN 325 MG PO TABS: 650.0000 mg | ORAL_TABLET | Freq: Four times a day (QID) | ORAL | Status: DC | PRN

## 2015-01-08 MED ORDER — SODIUM CHLORIDE 0.9 % IV SOLN: INTRAVENOUS | Status: DC

## 2015-01-08 MED ORDER — POTASSIUM CHLORIDE CRYS ER 20 MEQ PO TBCR
40.0000 meq | EXTENDED_RELEASE_TABLET | Freq: Once | ORAL | Status: AC
  Administered 2015-01-08: 40 meq via ORAL
  Filled 2015-01-08: qty 2

## 2015-01-08 MED ORDER — ACETAMINOPHEN 500 MG PO TABS
1000.0000 mg | ORAL_TABLET | Freq: Once | ORAL | Status: AC
  Administered 2015-01-08: 1000 mg via ORAL

## 2015-01-08 MED ORDER — AMLODIPINE BESYLATE 5 MG PO TABS
5.0000 mg | ORAL_TABLET | Freq: Every day | ORAL | Status: DC
  Administered 2015-01-08: 5 mg via ORAL
  Filled 2015-01-08: qty 1

## 2015-01-08 MED ORDER — HEPARIN SODIUM (PORCINE) 5000 UNIT/ML IJ SOLN
5000.0000 [IU] | Freq: Three times a day (TID) | INTRAMUSCULAR | Status: DC
  Administered 2015-01-08 – 2015-01-09 (×5): 5000 [IU] via SUBCUTANEOUS
  Filled 2015-01-08 (×4): qty 1

## 2015-01-08 MED ORDER — POTASSIUM CHLORIDE CRYS ER 20 MEQ PO TBCR
40.0000 meq | EXTENDED_RELEASE_TABLET | Freq: Four times a day (QID) | ORAL | Status: AC
  Administered 2015-01-08 (×2): 40 meq via ORAL
  Filled 2015-01-08 (×2): qty 2

## 2015-01-08 MED ORDER — ACETAMINOPHEN 500 MG PO TABS
ORAL_TABLET | ORAL | Status: AC
  Filled 2015-01-08: qty 2

## 2015-01-08 MED ORDER — CEFTRIAXONE SODIUM IN DEXTROSE 20 MG/ML IV SOLN
1.0000 g | INTRAVENOUS | Status: DC
  Administered 2015-01-08 – 2015-01-09 (×2): 1 g via INTRAVENOUS
  Filled 2015-01-08 (×3): qty 50

## 2015-01-08 NOTE — Progress Notes (Signed)
[facility] PROGRESS NOTE  Austin Mccarty ZOX:[id number] DOB: Dec 15, 1965 DOA: 01/07/2015 PCP: PROVIDER NOT IN SYSTEM  Brief narrative 49 year old obese male with hypertension, (not taking medications),? Chronic kidney disease stage II who presented with cough for almost 2 months and treated with azithromycin at the urgent care. Patient presented with sepsis with fever of 101.40F, tachycardia and mild leukocytosis with finding of extensive perihilar predominant heterogeneous airspace opacities. Patient admitted for sepsis due to pneumonia. Patient works as a Naval architect with long distance travel. He has not been out of country since august 2015 ( went to [place]). denies weight loss, hemoptysis or calf swelling. Denies sick contact,unprotected sex, IVDU or weight loss..   Assessment/Plan: Sepsis due to pneumonia Patient on empiric Rocephin and azithromycin. Sepsis protocol initiated in the ED. Vitals currently stable. Lactic acid normal. Follow blood culture, sputum culture, urine strep antigen and Legionella. HIVab. Flu PCR negative. Given his extensive bilateral opacities and possible alveolar edema on chest x-ray will obtain CT scan without contrast to further evaluate. Check d dimer.  Acute vs acute on chr kidney injury Avoid nephrotoxins. Will hold lisinopril. Check UA and urine drug screen. Renal ultrasound suggests medical renal disease.    Uncontrolled hypertension Patient not taking blood pressure medications for past 2 months. Placed on amlodipine and when necessary hydralazine. Counseled on diet to the adherence and medication compliance  ? Volume overload Mildly elevated BNP with trace leg edmea. Check 2 d echo    Hyponatremia Possibly related to pneumonia. Monitor in a.m. lab   hypokalemia Replenish with KCl Follow-up in a.m. labs.    DVT Prophylaxis: Subcutaneous heparin  Diet: Low sodium   Code Status: Full code Family Communication: None at bedside   Disposition Plan: Currently inpatient.   Consultants:  NONE  Procedures:  2 D ECHO  CT CHEST  Antibiotics:  IV Rocephin and azithromycin since 4/22  HPI/Subjective: Seen and xamned. Admission H&P reviewed. C/o congestion and productive cough . No hemoptysis  Objective: Filed Vitals:   01/08/15 0619  BP: 189/105  Pulse:   Temp: 97.8 F (36.6 C)  Resp:     Intake/Output Summary (Last 24 hours) at 01/08/15 1022 Last data filed at 01/08/15 0945  Gross per 24 hour  Intake    120 ml  Output      0 ml  Net    120 ml   Filed Weights   01/07/15 1722 01/08/15 0224  Weight: 90.719 kg (200 lb) 119.4 kg (263 lb 3.7 oz)    Exam:   General:  Middle aged obese male in no acute distress  HEENT: No pallor, nasal congestion, moist mucosa, supple neck, no cervical lymphadenopathy  Chest: Coarse breath sounds bilaterally, no rhonchi or wheeze  CVS: Normal S1 and S2, no murmurs rub or gallop  GI: Soft, nondistended, nontender, bowel sounds present  Musculoskeletal: Warm, trace edema  CNS: Alert and oriented   Data Reviewed: Basic Metabolic Panel:  Recent Labs Lab 01/07/15 2010  NA 128*  K 2.9*  CL 89*  CO2 29  GLUCOSE 107*  BUN 23  CREATININE 1.58*  CALCIUM 8.1*   Liver Function Tests: No results for input(s): AST, ALT, ALKPHOS, BILITOT, PROT, ALBUMIN in the last 168 hours. No results for input(s): LIPASE, AMYLASE in the last 168 hours. No results for input(s): AMMONIA in the last 168 hours. CBC:  Recent Labs Lab 01/07/15 2010  WBC 12.5*  HGB 12.4*  HCT 35.7*  MCV 68.8*  PLT 328  Cardiac Enzymes: No results for input(s): CKTOTAL, CKMB, CKMBINDEX, TROPONINI in the last 168 hours. BNP (last 3 results)  Recent Labs  01/07/15 2010  BNP 551.8*    ProBNP (last 3 results) No results for input(s): PROBNP in the last 8760 hours.  CBG: No results for input(s): GLUCAP in the last 168 hours.  Recent Results (from the past 240 hour(s))   Culture, blood (routine x 2)     Status: None (Preliminary result)   Collection Time: 01/07/15  8:10 PM  Result Value Ref Range Status   Specimen Description BLOOD RIGHT AC  Final   Special Requests BOTTLES DRAWN AEROBIC AND ANAEROBIC 5CC EACH  Final   Culture   Final           BLOOD CULTURE RECEIVED NO GROWTH TO DATE CULTURE WILL BE HELD FOR 5 DAYS BEFORE ISSUING A FINAL NEGATIVE REPORT [facility]    Report Status PENDING  Incomplete  Culture, blood (routine x 2)     Status: None (Preliminary result)   Collection Time: 01/07/15  8:15 PM  Result Value Ref Range Status   Specimen Description BLOOD RIGHT HAND  Final   Special Requests BOTTLES DRAWN AEROBIC AND ANAEROBIC 5CC EACH  Final   Culture   Final           BLOOD CULTURE RECEIVED NO GROWTH TO DATE CULTURE WILL BE HELD FOR 5 DAYS BEFORE ISSUING A FINAL NEGATIVE REPORT Performed at [facility]    Report Status PENDING  Incomplete     Studies: Dg Chest 2 View  01/07/2015   CLINICAL DATA:  Intermittent moderate productive cough with fever.  EXAM: CHEST  2 VIEW  COMPARISON:  None.  FINDINGS: Borderline enlarged cardiac silhouette and mediastinal contours. There are extensive ill-defined heterogeneous airspace opacities about the bilateral pulmonary hila with relative sparing of the long periphery. No pleural effusion or pneumothorax. No acute osseus abnormalities.  IMPRESSION: Extensive perihilar predominant heterogeneous airspace opacities - differential considerations are broad and include perihilar alveolar pulmonary edema and inhalational injury, though multifocal infection, including atypical etiologies could have a similar appearance. A follow-up chest radiograph in 3 to 4 weeks after treatment is recommended to ensure resolution.   Electronically Signed   By: Simonne ComeJohn  Watts M.D.   On: 01/07/2015 19:18   [place] Renal  01/08/2015   CLINICAL DATA:  Acute renal insufficiency.  Hypertension.  EXAM: RENAL/URINARY  TRACT ULTRASOUND COMPLETE  COMPARISON:  None.  FINDINGS: Right Kidney:  Length: 12.1 cm. Moderate increased renal cortical echogenicity. No mass or hydronephrosis visualized.  Left Kidney:  Length: 12.5 cm. Mild increased renal cortical echogenicity. No mass or hydronephrosis visualized.  Bladder:  Appears normal for degree of bladder distention.  IMPRESSION: Normal size kidneys with increased renal cortical echogenicity compatible with medical renal disease. No hydronephrosis.   Electronically Signed   By: Elberta Fortisaniel  Boyle M.D.   On: 01/08/2015 10:13    Scheduled Meds: . [START ON 01/09/2015] amLODipine  10 mg Oral Daily  . azithromycin  500 mg Intravenous Q24H  . cefTRIAXone (ROCEPHIN)  IV  1 g Intravenous Q24H  . heparin  5,000 Units Subcutaneous 3 times per day   Continuous Infusions:     Time spent: 25 minutes    Jamire Shabazz  [facility] Pager [phone]. If 7PM-7AM, please contact night-coverage at [email or web address], password [email or web address] 01/08/2015, 10:22 AM  LOS: 1 day

## 2015-01-08 NOTE — Progress Notes (Signed)
Utilization review completed.  

## 2015-01-08 NOTE — Progress Notes (Signed)
[facility]; in no apparent distress.  Able to ambulate to side of bed.  Made aware of use of call system and placed within reach.  Able to make needs known.  Will continue to monitor.

## 2015-01-08 NOTE — H&P (Signed)
[facility] History and Physical  Austin DiegoMohialdin H. Atwood QVZ:[id number] DOB: 02/23/1966 DOA: 01/07/2015  Referring physician: ED physician PCP: PROVIDER NOT IN SYSTEM  Specialists:   Chief Complaint: Cough  HPI: Austin Mccarty is a 49 y.o. male with past medical history of hypertension, chronic kidney disease (possibly stage II), who presents with cough.  Patient reports that he has been having coughing for nearly 2 months. At the beginning of his cough, he was evaluated in the urgent care. He was treated with a course of azithromycin. He reports that he felt better after completed antibiotic treatment, but his cough came back again one week after he completed the treatment. It has been intermittently worsening and recovering. He took over-the-counter Tylenol, ibuprofen and allergy medications, with some relief. In the past several days, his cough has been progressively getting worse. He coughs up clear mucus. He does not have significant shortness of breath. No chest pain. He does not have subjective fever, but his temperature is 101.3 in the emergency room. Patient has mild sore throat and post nasal drip. He is a Naval architecttruck driver. He does not have any tenderness over calf areas bilaterally.   ROS: currently patient denies chills, fatigue, ear pain, headaches, abdominal pain, diarrhea, constipation, dysuria, urgency, frequency, hematuria, skin rashes, joint pain or leg swelling. No unilateral weakness, numbness or tingling sensations. No vision change or hearing loss.  In ED, patient was found to have temperature 101.3, WBC 12.5, potassium 2.9, AoCKD-II, BNP 551.8, tachycardia, oxygen saturation 91% on room air. CXR showed extensive perihilar predominant heterogeneous airspace opacities. Patient is admitted to inpatient for further evaluation and treatment.  Review of Systems: As presented in the history of presenting illness, rest negative.  Where does patient live?  At home Can patient  participate in ADLs? Yes  Allergy:  Allergies  Allergen Reactions  . Ibuprofen Itching and Swelling    Past Medical History  Diagnosis Date  . Hypertension   . CKD (chronic kidney disease) stage 2, GFR 60-89 ml/min     Past Surgical History  Procedure Laterality Date  . Eye surgery      Social History:  reports that he has never smoked. He uses smokeless tobacco. He reports that he does not drink alcohol or use illicit drugs.  Family History:  Family History  Problem Relation Age of Onset  . Hypertension Father      Prior to Admission medications   Medication Sig Start Date End Date Taking? Authorizing Provider  azithromycin (ZITHROMAX Z-PAK) 250 MG tablet Take 2 pills today, then 1 pill daily until gone 10/14/14   Charm RingsErin J Honig, MD  benazepril (LOTENSIN) 20 MG tablet Take 1 tablet (20 mg total) by mouth daily. 05/07/13   Blake DivineJohn Wofford, MD    Physical Exam: Filed Vitals:   01/07/15 2250 01/08/15 0051 01/08/15 0221 01/08/15 0224  BP: 185/102 168/105 173/97   Pulse: 96 96    Temp: 101 F (38.3 C) 101.3 F (38.5 C) 98.6 F (37 C)   TempSrc: Oral Oral Oral   Resp: 20 20 18    Height:    6\' 1"  (1.854 m)  Weight:    119.4 kg (263 lb 3.7 oz)  SpO2: 94% 91% 99%    General: Not in acute distress HEENT:       Eyes: PERRL, EOMI, no scleral icterus       ENT: No discharge from the ears and nose, no pharynx injection, no tonsillar enlargement.  Neck: No JVD, no bruit, no mass felt. Cardiac: S1/S2, RRR, No murmurs, No gallops or rubs Pulm: has fine crackles bilaterally. No wheezing, rhonchi or rubs. Abd: Soft, nondistended, nontender, no rebound pain, no organomegaly, BS present Ext: trace leg edema bilaterally. 2+DP/PT pulse bilaterally Musculoskeletal: No joint deformities, erythema, or stiffness, ROM full Skin: No rashes.  Neuro: Alert and oriented X3, cranial nerves II-XII grossly intact, muscle strength 5/5 in all extremeties, sensation to light touch intact.   Psych: Patient is not psychotic, no suicidal or hemocidal ideation.  Labs on Admission:  Basic Metabolic Panel:  Recent Labs Lab 01/07/15 2010  NA 128*  K 2.9*  CL 89*  CO2 29  GLUCOSE 107*  BUN 23  CREATININE 1.58*  CALCIUM 8.1*   Liver Function Tests: No results for input(s): AST, ALT, ALKPHOS, BILITOT, PROT, ALBUMIN in the last 168 hours. No results for input(s): LIPASE, AMYLASE in the last 168 hours. No results for input(s): AMMONIA in the last 168 hours. CBC:  Recent Labs Lab 01/07/15 2010  WBC 12.5*  HGB 12.4*  HCT 35.7*  MCV 68.8*  PLT 328   Cardiac Enzymes: No results for input(s): CKTOTAL, CKMB, CKMBINDEX, TROPONINI in the last 168 hours.  BNP (last 3 results)  Recent Labs  01/07/15 2010  BNP 551.8*    ProBNP (last 3 results) No results for input(s): PROBNP in the last 8760 hours.  CBG: No results for input(s): GLUCAP in the last 168 hours.  Radiological Exams on Admission: Dg Chest 2 View  01/07/2015   CLINICAL DATA:  Intermittent moderate productive cough with fever.  EXAM: CHEST  2 VIEW  COMPARISON:  None.  FINDINGS: Borderline enlarged cardiac silhouette and mediastinal contours. There are extensive ill-defined heterogeneous airspace opacities about the bilateral pulmonary hila with relative sparing of the long periphery. No pleural effusion or pneumothorax. No acute osseus abnormalities.  IMPRESSION: Extensive perihilar predominant heterogeneous airspace opacities - differential considerations are broad and include perihilar alveolar pulmonary edema and inhalational injury, though multifocal infection, including atypical etiologies could have a similar appearance. A follow-up chest radiograph in 3 to 4 weeks after treatment is recommended to ensure resolution.   Electronically Signed   By: Simonne Come M.D.   On: 01/07/2015 19:18    EKG: will get one  Assessment/Plan Principal Problem:   CAP (community acquired pneumonia) Active Problems:    Acute-on-chronic kidney injury   Hypertension   Sepsis   Hypokalemia  CAP and sepsis: Patient's cough, leukocytosis, fever plus x-ray findings are consistent with comminuting quited pneumonia. He may have atypical infection given heterogeneous airspace opacities on CXR and protracted course of cough. Currently patient is mildly septic with leukocytosis, fever and tachycardia. He is hemodynamically stable. - Will admit to Telemetry Bed - IV Rocephin and azithromycin (patient received 1 dose of Cipro in emergency room). - Urine legionella and S. pneumococcal antigen - Follow up blood culture x2, sputum culture and respiratory virus panel, plus Flu pcr -will get Procalcitonin and trend lactic acid level per sepsis protocol -IVF: received 1L of NS bolus in ED, followed by 75 mL per hour of normal saline (pt may have undiagnosed CHF, which limits aggressive IVF, see below) -check UDS and HIV ab  HTN:  -Switch lisinopril to amlodipine due to AoCKD -IV when necessary hydralazine -get 2d echo: pt has BNP 551.8, trace leg edema and cardiac enlargement on chest x-ray, indicating possible undiagnosed congestive heart failure. Though elevated BNP may be partially from worsening renal function,  but still not ruling out CHF.  Possible AoCKD-II: Patient had mildly elevated creatinine in the past. His creatinine was 1.28 on 05/07/13 with GFR 77. Today, his creatinine is 1.58, BUN 23. Not very sure how acutely these have been developed. -hold lisinopril -check FeNa and US-renal -IVF as above  Hypokalemia: Potassium 2.9 on admission -Repleted   DVT ppx: SQ Heparin       Code Status: Full code Family Communication: None at bed side.       Disposition Plan: Admit to inpatient   Date of Service 01/08/2015    Lorretta Harp [facility] Pager [phone]  If 7PM-7AM, please contact night-coverage [email or web address] Password [email or web address] 01/08/2015, 3:10 AM

## 2015-01-08 NOTE — Care Management Note (Unsigned)
    Page 1 of 1   01/08/2015     2:39:45 PM CARE MANAGEMENT NOTE 01/08/2015  Patient:  Austin Mccarty,Austin H.   Account Number:  [id number]  Date Initiated:  01/08/2015  Documentation initiated by:  Lawerance SabalSWIST,DEBBIE  Subjective/Objective Assessment:   pt admittd w resp distress and pnu.     Action/Plan:   will follow for home health needs   Anticipated DC Date:  01/11/2015   Anticipated DC Plan:  HOME/SELF CARE      DC Planning Services  CM consult  [facility]      Choice offered to / List presented to:             Status of service:  In process, will continue to follow Medicare Important Message given?   (If response is "NO", the following Medicare IM given date fields will be blank) Date Medicare IM given:   Medicare IM given by:   Date Additional Medicare IM given:   Additional Medicare IM given by:    Discharge Disposition:    Per UR Regulation:    If discussed at Long Length of Stay Meetings, dates discussed:    Comments:  [facility]08-15 [email or web address]  Will follow for any further needs Lawerance Sabalebbie Swist RN BSN CM

## 2015-01-09 DIAGNOSIS — S2231XA Fracture of one rib, right side, initial encounter for closed fracture: Secondary | ICD-10-CM

## 2015-01-09 DIAGNOSIS — R0600 Dyspnea, unspecified: Secondary | ICD-10-CM

## 2015-01-09 DIAGNOSIS — E871 Hypo-osmolality and hyponatremia: Secondary | ICD-10-CM | POA: Diagnosis present

## 2015-01-09 LAB — BASIC METABOLIC PANEL
ANION GAP: 10 (ref 5–15)
BUN: 18 mg/dL (ref 6–23)
CHLORIDE: 93 mmol/L — AB (ref 96–112)
CO2: 25 mmol/L (ref 19–32)
Calcium: 8.3 mg/dL — ABNORMAL LOW (ref 8.4–10.5)
Creatinine, Ser: 1.31 mg/dL (ref 0.50–1.35)
GFR calc non Af Amer: 64 mL/min — ABNORMAL LOW (ref >90)
GFR, EST AFRICAN AMERICAN: 74 mL/min — AB (ref >90)
GLUCOSE: 114 mg/dL — AB (ref 70–99)
Potassium: 3.3 mmol/L — ABNORMAL LOW (ref 3.5–5.1)
SODIUM: 128 mmol/L — AB (ref 135–145)

## 2015-01-09 MED ORDER — LEVOFLOXACIN 750 MG PO TABS: 750.0000 mg | ORAL_TABLET | Freq: Every day | ORAL | Status: AC

## 2015-01-09 MED ORDER — AMLODIPINE BESYLATE 10 MG PO TABS: 10.0000 mg | ORAL_TABLET | Freq: Every day | ORAL | Status: DC

## 2015-01-09 MED ORDER — POTASSIUM CHLORIDE CRYS ER 20 MEQ PO TBCR
40.0000 meq | EXTENDED_RELEASE_TABLET | Freq: Once | ORAL | Status: AC
  Administered 2015-01-09: 40 meq via ORAL
  Filled 2015-01-09: qty 2

## 2015-01-09 MED ORDER — GUAIFENESIN-DM 100-10 MG/5ML PO SYRP: 5.0000 mL | ORAL_SOLUTION | ORAL | Status: DC | PRN

## 2015-01-09 NOTE — Discharge Summary (Addendum)
Physician Discharge Summary  Austin Mccarty ZOX:[id number] DOB: 07/16/1966 DOA: 01/07/2015  PCP: PROVIDER NOT IN SYSTEM  Admit date: 01/07/2015 Discharge date: 01/09/2015  Time spent: 35 minutes  Recommendations for Outpatient Follow-up:  Patient being discharged home with outpatient follow-up at [facility] on 4/26 at 11:30 AM 1. Please follow 2-D echo results done on 01/09/2015 2. Please follow result of urine Legionella antigen. Please repeat BMET during outpatient follow-up 3. Patient will need follow-up CT of the chest in 4 weeks to ensure resolution of pneumonia and follow-up on mediastinal lymphadenopathy. 4. Patient will complete antibiotic on 4/28  Discharge Diagnoses:  Principal Problem:   Sepsis   Active Problems:   CAP (community acquired pneumonia)   Hypertension   Acute-on-chronic kidney injury   Hypokalemia   Hyponatremia   Discharge Condition: Fair  Diet recommendation: Low sodium  Filed Weights   01/07/15 1722 01/08/15 0224  Weight: 90.719 kg (200 lb) 119.4 kg (263 lb 3.7 oz)    History of present illness:  49 year old obese male with hypertension, (not taking medications),? Chronic kidney disease stage II who presented with cough for almost 2 months and treated with azithromycin at the urgent care. Patient presented with sepsis with fever of 101.63F, tachycardia and mild leukocytosis with finding of extensive perihilar predominant heterogeneous airspace opacities. Patient admitted for sepsis due to pneumonia. 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 He has not been out of country since august 2015 ( went to [place]). denies weight loss, hemoptysis or calf swelling. Denies sick contact,unprotected sex, IVDU or weight loss.Marland Kitchen.  Hospital Course:  Sepsis associated with community-acquired pneumonia Patient on empiric Rocephin and azithromycin. Sepsis protocol initiated in the ED. Vitals  stable. Lactic acid normal. Blood cultures  negative. D-dimer negative. HIV antibody and  strep antigen negative. Legionella antigen pending and should be followed up as outpatient. Flu PCR negative. Vision remains afebrile and symptoms much better. Significant findings on chest x-ray or CT scan of the chest was done which showed airspace opacities throughout both lungs consistent with multifocal pneumonia and minimal left pleural effusion. Also showed mildly enlarged mediastinal adenopathy. Recommend follow-up chest CT in 3-4 weeks to ensure resolution. Patient denies any constitutional symptoms suggestive of malignancy.   Right lower rib fracture  incidental finding on CT chest.. Patient asymptomatic. No hx of trauma  Acute vs acute on chr kidney injury Renal function improved with IV fluids. Ultrasound of the abdomen shows medical renal disease. UA shows mild proteinuria. Urine drug screen negative.   Uncontrolled hypertension Patient not taking blood pressure medications for past 2 months. Patient counseled on medication adherence. We'll prescribe amlodipine 10 mg daily. Please titrate blood pressure medications during outpatient follow-up. Patient counseled on diet and exercise to lose weight.  ? Volume overload Mildly elevated BNP with trace leg edmea. 2-D echo done and results should be followed as outpatient    Hyponatremia Possibly related to pneumonia versus dehydration. Sodium still low at 128. Needs follow-up during outpatient visit.  hypokalemia Replenished. Needs to follow up as outpatient   Patient instructed that he would benefit from another day of IV hydration and IV antibiotics but he insisted he needs to go home. It isn't medically stable at this time and since he has an outpatient visit for new patient in 2 days, he is stable to be discharged  home.    Code Status: Full code Family Communication: None at bedside  Disposition Plan: Home with [facility]   Consultants:  NONE  Procedures:  2 D ECHO  CT CHEST  Antibiotics:  IV Rocephin and azithromycin since 4/22--  Complete Levaquin on 4/28  Discharge Exam: Filed Vitals:   01/09/15 0515  BP: 164/77  Pulse:   Temp: 98.5 F (36.9 C)  Resp: 18    General: Middle aged obese male in no acute distress  HEENT: No pallor, , moist mucosa, supple neck, no cervical lymphadenopathy  Chest: Coarse breath sounds bilaterally, no rhonchi or wheeze  CVS: Normal S1 and S2, no murmurs rub or gallop  GI: Soft, nondistended, nontender, bowel sounds present  Musculoskeletal: Warm, trace edema  CNS: Alert and oriented  Discharge Instructions    Current Discharge Medication List    START taking these medications   Details  amLODipine (NORVASC) 10 MG tablet Take 1 tablet (10 mg total) by mouth daily. Qty: 30 tablet, Refills: 2    guaiFENesin-dextromethorphan (ROBITUSSIN DM) 100-10 MG/5ML syrup Take 5 mLs by mouth every 4 (four) hours as needed for cough. Qty: 118 mL, Refills: 0    levofloxacin (LEVAQUIN) 750 MG tablet Take 1 tablet (750 mg total) by mouth daily. Qty: 5 tablet, Refills: 0      STOP taking these medications     azithromycin (ZITHROMAX Z-PAK) 250 MG tablet      benazepril (LOTENSIN) 20 MG tablet        Allergies  Allergen Reactions  . Ibuprofen Itching and Swelling   Follow-up Information    Follow up with [facility] On 01/12/2015.   Why:  appointment for 11:30 am.    Contact information:   [address] [facility] [id number] [phone]       The results of significant diagnostics from this hospitalization (including imaging, microbiology, ancillary and laboratory) are listed below for reference.    Significant Diagnostic Studies: Dg Chest 2 View  01/07/2015   CLINICAL DATA:  Intermittent moderate productive cough with fever.  EXAM: CHEST  2 VIEW  COMPARISON:  None.  FINDINGS:  Borderline enlarged cardiac silhouette and mediastinal contours. There are extensive ill-defined heterogeneous airspace opacities about the bilateral pulmonary hila with relative sparing of the long periphery. No pleural effusion or pneumothorax. No acute osseus abnormalities.  IMPRESSION: Extensive perihilar predominant heterogeneous airspace opacities - differential considerations are broad and include perihilar alveolar pulmonary edema and inhalational injury, though multifocal infection, including atypical etiologies could have a similar appearance. A follow-up chest radiograph in 3 to 4 weeks after treatment is recommended to ensure resolution.   Electronically Signed   By: Simonne Come M.D.   On: 01/07/2015 19:18   Ct Chest Wo Contrast  01/08/2015   CLINICAL DATA:  Bilateral pneumonia with edema ; cough.  EXAM: CT CHEST WITHOUT CONTRAST  TECHNIQUE: Multidetector CT imaging of the chest was performed following the standard protocol without IV contrast.  COMPARISON:  Radiograph of January 07, 2015.  FINDINGS: No pneumothorax is noted. Minimal left pleural effusion is noted. Multifocal airspace opacities are noted through the upper and lower lobes bilaterally most consistent with multifocal pneumonia or possibly edema. Visualized portions of upper abdomen appear normal. Mildly enlarged adenopathy is noted in the right peritracheal, precarinal, aortopulmonary window in sub carinal regions. Subcarinal adenopathy is the largest measuring 4.1 x 2.4 cm. This most likely is inflammatory in origin, but lymphoma or metastatic disease cannot be excluded. Mildly displaced fracture is seen involving posterior and lateral portion of right lower rib.  IMPRESSION:  Mildly displaced acute appearing fracture involving the posterior and lateral portion of right lower rib.  Airspace opacities are noted throughout both lungs most consistent with multifocal pneumonia, although edema may also be considered.  Minimal left pleural  effusion is noted as well.  Mildly enlarged mediastinal adenopathy is noted which most likely is infectious or inflammatory in origin, but lymphoma or metastatic disease cannot be excluded. Followup CT imaging in 3-4 weeks is recommended to ensure resolution.   Electronically Signed   By: Lupita Raider, M.D.   On: 01/08/2015 12:18   US Renal  01/08/2015   CLINICAL DATA:  Acute renal insufficiency.  Hypertension.  EXAM: RENAL/URINARY TRACT ULTRASOUND COMPLETE  COMPARISON:  None.  FINDINGS: Right Kidney:  Length: 12.1 cm. Moderate increased renal cortical echogenicity. No mass or hydronephrosis visualized.  Left Kidney:  Length: 12.5 cm. Mild increased renal cortical echogenicity. No mass or hydronephrosis visualized.  Bladder:  Appears normal for degree of bladder distention.  IMPRESSION: Normal size kidneys with increased renal cortical echogenicity compatible with medical renal disease. No hydronephrosis.   Electronically Signed   By: Elberta Fortis M.D.   On: 01/08/2015 10:13    Microbiology: Recent Results (from the past 240 hour(s))  Culture, blood (routine x 2)     Status: None (Preliminary result)   Collection Time: 01/07/15  8:10 PM  Result Value Ref Range Status   Specimen Description BLOOD RIGHT AC  Final   Special Requests BOTTLES DRAWN AEROBIC AND ANAEROBIC 5CC EACH  Final   Culture   Final           BLOOD CULTURE RECEIVED NO GROWTH TO DATE CULTURE WILL BE HELD FOR 5 DAYS BEFORE ISSUING A FINAL NEGATIVE REPORT Performed at [facility]    Report Status PENDING  Incomplete  Culture, blood (routine x 2)     Status: None (Preliminary result)   Collection Time: 01/07/15  8:15 PM  Result Value Ref Range Status   Specimen Description BLOOD RIGHT HAND  Final   Special Requests BOTTLES DRAWN AEROBIC AND ANAEROBIC 5CC EACH  Final   Culture   Final           BLOOD CULTURE RECEIVED NO GROWTH TO DATE CULTURE WILL BE HELD FOR 5 DAYS BEFORE ISSUING A FINAL NEGATIVE REPORT Performed at  [facility]    Report Status PENDING  Incomplete     Labs: Basic Metabolic Panel:  Recent Labs Lab 01/07/15 2010 01/08/15 1105 01/08/15 1330 01/09/15 1119  NA 128* 129*  --  128*  K 2.9* 2.8*  --  3.3*  CL 89* 91*  --  93*  CO2 29 28  --  25  GLUCOSE 107* 126*  --  114*  BUN 23 17  --  18  CREATININE 1.58* 1.32  --  1.31  CALCIUM 8.1* 7.9*  --  8.3*  MG  --   --  1.7  --    Liver Function Tests: No results for input(s): AST, ALT, ALKPHOS, BILITOT, PROT, ALBUMIN in the last 168 hours. No results for input(s): LIPASE, AMYLASE in the last 168 hours. No results for input(s): AMMONIA in the last 168 hours. CBC:  Recent Labs Lab 01/07/15 2010 01/08/15 1105  WBC 12.5* 9.5  HGB 12.4* 11.3*  HCT 35.7* 33.1*  MCV 68.8* 69.1*  PLT 328 269   Cardiac Enzymes: No results for input(s): CKTOTAL, CKMB, CKMBINDEX, TROPONINI in the last 168 hours. BNP: BNP (last 3 results)  Recent  Labs  01/07/15 2010  BNP 551.8*    ProBNP (last 3 results) No results for input(s): PROBNP in the last 8760 hours.  CBG: No results for input(s): GLUCAP in the last 168 hours.     SignedEddie North  [facility] 01/09/2015, 1:34 PM

## 2015-01-09 NOTE — Progress Notes (Signed)
  Echocardiogram 2D Echocardiogram has been performed.  Wolf Boulay FRANCES 01/09/2015, 1:00 PM

## 2015-01-09 NOTE — Progress Notes (Signed)
01/09/15 patient being discharged home today. IV site removed and discharge instructions reviewed with patient.

## 2015-01-09 NOTE — Discharge Instructions (Signed)

## 2015-01-11 LAB — LEGIONELLA ANTIGEN, URINE

## 2015-01-12 ENCOUNTER — Encounter: Payer: Self-pay | Admitting: Family Medicine

## 2015-01-12 ENCOUNTER — Ambulatory Visit: Payer: Self-pay | Attending: Family Medicine | Admitting: Family Medicine

## 2015-01-12 VITALS — BP 181/114 | HR 93 | Temp 98.2°F | Wt 255.6 lb

## 2015-01-12 DIAGNOSIS — R590 Localized enlarged lymph nodes: Secondary | ICD-10-CM

## 2015-01-12 DIAGNOSIS — I5042 Chronic combined systolic (congestive) and diastolic (congestive) heart failure: Secondary | ICD-10-CM | POA: Insufficient documentation

## 2015-01-12 DIAGNOSIS — I5021 Acute systolic (congestive) heart failure: Secondary | ICD-10-CM

## 2015-01-12 DIAGNOSIS — E876 Hypokalemia: Secondary | ICD-10-CM

## 2015-01-12 DIAGNOSIS — E871 Hypo-osmolality and hyponatremia: Secondary | ICD-10-CM

## 2015-01-12 DIAGNOSIS — J189 Pneumonia, unspecified organism: Secondary | ICD-10-CM

## 2015-01-12 DIAGNOSIS — I10 Essential (primary) hypertension: Secondary | ICD-10-CM

## 2015-01-12 DIAGNOSIS — R599 Enlarged lymph nodes, unspecified: Secondary | ICD-10-CM

## 2015-01-12 LAB — RESPIRATORY VIRUS PANEL
Adenovirus: NEGATIVE
Influenza A: NEGATIVE
Influenza B: NEGATIVE
METAPNEUMOVIRUS: NEGATIVE
PARAINFLUENZA 1 A: NEGATIVE
Parainfluenza 2: NEGATIVE
Parainfluenza 3: NEGATIVE
RESPIRATORY SYNCYTIAL VIRUS A: NEGATIVE
Respiratory Syncytial Virus B: NEGATIVE
Rhinovirus: NEGATIVE

## 2015-01-12 MED ORDER — CARVEDILOL 3.125 MG PO TABS: 3.1250 mg | ORAL_TABLET | Freq: Two times a day (BID) | ORAL | Status: DC

## 2015-01-12 MED ORDER — AMLODIPINE BESYLATE 10 MG PO TABS: 10.0000 mg | ORAL_TABLET | Freq: Every day | ORAL | Status: DC

## 2015-01-12 MED ORDER — LISINOPRIL 10 MG PO TABS: 10.0000 mg | ORAL_TABLET | Freq: Every day | ORAL | Status: DC

## 2015-01-12 NOTE — Progress Notes (Signed)
Subjective:    Patient ID: Austin Mccarty, male    DOB: 1966-01-25, 49 y.o.   MRN: [id number]  HPI  Admit Date: 01/07/15 Discharge date: 01/09/15  Catherine Oak had presented to [facility] ED with cough for almost 2 months treated with azithromycin at the urgent care and at presentation had a  fever of 101.1F, tachycardia and mild leukocytosis with finding of extensive perihilar predominant heterogeneous airspace opacities on CXR;  he met the sepsis criteria and was diagnosed with Pneumonia. His blood pressure had also been elevated and he had been out of his antihypertensives.  He was placed on IV fluids, Rocephin and azithromycin; blood cultures came back negative as well as influenza and H1N1. He had a CT chest which revealed airspace opacities consistent with multifocal pneumonia, minimal left pleural effusion, mildly enlarged mediastinal adenopathy with a follow-up CT imaging in 3-4 weeks recommended.   Interval History: He reports doing well and denies any shortness of breath. His blood pressure is severely elevated today which she attributes to not taking his antihypertensives yet as he was waiting to have his lunch prior to taking it. He had a 2-D echo on the day of discharge and I have reviewed the results which indicate left ventricular ejection fraction of 35-40%, mild mitral and tricuspid valve regurgitation, mildly dilated left and right atrium and diffuse hypokinesis.  Past Medical History  Diagnosis Date  . CKD (chronic kidney disease) stage 2, GFR 60-89 ml/min   . CAP (community acquired pneumonia) 01/08/2015  . Foreign body, eye 2009  . Hypertension     hx/pt 01/08/2015    Past Surgical History  Procedure Laterality Date  . Eye surgery Right 2009    "removed glass"    Family History  Problem Relation Age of Onset  . Hypertension Father     History   Social History  . Marital Status: Married    Spouse Name: N/A  . Number of Children: N/A  . Years of  Education: N/A   Occupational History  . Not on file.   Social History Main Topics  . Smoking status: Former Smoker -- 1.00 packs/day for 8 years    Types: Cigarettes  . Smokeless tobacco: Never Used     Comment: "quit smoking in ~ 2009  . Alcohol Use: No  . Drug Use: No  . Sexual Activity: Yes   Other Topics Concern  . Not on file   Social History Narrative   Allergies  Allergen Reactions  . Ibuprofen Itching and Swelling    Current Outpatient Prescriptions on File Prior to Visit  Medication Sig Dispense Refill  . guaiFENesin-dextromethorphan (ROBITUSSIN DM) 100-10 MG/5ML syrup Take 5 mLs by mouth every 4 (four) hours as needed for cough. 118 mL 0  . levofloxacin (LEVAQUIN) 750 MG tablet Take 1 tablet (750 mg total) by mouth daily. 5 tablet 0   No current facility-administered medications on file prior to visit.     Review of Systems General: negative for fever, weight loss, appetite change Eyes: no visual symptoms. ENT: no ear symptoms, no sinus tenderness, no nasal congestion or sore throat. Neck: no pain  Respiratory: no wheezing, shortness of breath, cough Cardiovascular: no chest pain, no dyspnea on exertion, no pedal edema, no orthopnea. Gastrointestinal: no abdominal pain, no diarrhea, no constipation Genito-Urinary: no urinary frequency, no dysuria, no polyuria. Hematologic: no bruising Endocrine: no cold or heat intolerance Neurological: no headaches, no seizures, no tremors Musculoskeletal: no joint pains, no joint swelling  Skin: no pruritus, no rash. Psychological: no depression, no anxiety,   Objective: Filed Vitals:   01/12/15 1222  BP: 181/114  Pulse:   Temp:     Filed Vitals:   01/12/15 1136  BP: 182/100  Pulse: 93  Temp: 98.2 F (36.8 C)      Physical Exam  Constitutional: normal appearing,  Eyes: PERRLA HEENT: Head is atraumatic, normal sinuses, normal oropharynx, normal appearing tonsils and palate, tympanic membrane is normal  bilaterally. Neck: normal range of motion, no thyromegaly, no JVD Cardiovascular: normal rate and rhythm, normal heart sounds, no murmurs, rub or gallop, no pedal edema Respiratory: clear to auscultation bilaterally, no wheezes, no rales, no rhonchi Abdomen: soft, not tender to palpation, normal bowel sounds, no enlarged organs Extremities: Full ROM, no tenderness in joints Skin: warm and dry, no lesions. Neurological: alert, oriented x3, cranial nerves I-XII grossly intact , normal motor strength, normal sensation. Psychological: normal mood.  Significant Diagnostic studies: *[facility]*            [place], [place] [address]              [phone]  ------------------------------------------------------------------- Transthoracic Echocardiography  Patient:  Austin, Mccarty MR #:    [id number] Study Date: 01/09/2015 Gender:   M Age:    28 Height:   185.4 cm Weight:   119.3 kg BSA:    2.52 m^2 Pt. Status: Room:    [place], [facility] ADMITTING  Ivor Costa [id number]   FXT, KWIOX [id number] Alvie Heidelberg [id number] ATTENDING  Dhungel, Nishant [phone] PERFORMING  [facility], Inpatient  cc:  ------------------------------------------------------------------- LV EF: 35% -  40%  ------------------------------------------------------------------- Indications:   Dyspnea 786.09.  ------------------------------------------------------------------- History:  Risk factors: Former tobacco use. Hypertension.  ------------------------------------------------------------------- Study Conclusions  - Left ventricle: The cavity size was mildly dilated. Wall thickness was increased in a pattern of moderate LVH. Systolic function was moderately reduced. The estimated ejection fraction was in the range  of 35% to 40%. Diffuse hypokinesis. The study is not technically sufficient to allow evaluation of LV diastolic function. - Mitral valve: There was moderate regurgitation. - Left atrium: The atrium was mildly dilated. - Right atrium: The atrium was mildly dilated. - Tricuspid valve: There was moderate regurgitation. - Pulmonary arteries: PA peak pressure: 59 mm Hg (S).  Impressions:  - Global L. Strain=-12.9.  Transthoracic echocardiography. M-mode, complete 2D, spectral Doppler, and color Doppler. Birthdate: Patient birthdate: 05-May-1966. Age: Patient is 49 yr old. Sex: Gender: male. BMI: 34.7 kg/m^2. Blood pressure:   164/77 Patient status: Inpatient. Study date: Study date: 01/09/2015. Study time: 03:13 PM. Location: Bedside.  -------------------------------------------------------------------  ------------------------------------------------------------------- Left ventricle: The cavity size was mildly dilated. Wall thickness was increased in a pattern of moderate LVH. Systolic function was moderately reduced. The estimated ejection fraction was in the range of 35% to 40%. Diffuse hypokinesis. The study is not technically sufficient to allow evaluation of LV diastolic function.  ------------------------------------------------------------------- Aortic valve:  Structurally normal valve.  Cusp separation was normal. Doppler: Transvalvular velocity was within the normal range. There was no stenosis. There was no regurgitation.  ------------------------------------------------------------------- Aorta: Aortic root: The aortic root was normal in size. Ascending aorta: The ascending aorta was normal in size.  ------------------------------------------------------------------- Mitral valve:  Structurally normal valve.  Leaflet separation was normal. Doppler: Transvalvular velocity  was within the normal range. There was no evidence for stenosis.  There was moderate regurgitation.  ------------------------------------------------------------------- Left atrium: The atrium was mildly dilated.  ------------------------------------------------------------------- Right ventricle: The cavity size was normal. Wall thickness was normal. Systolic function was normal.  ------------------------------------------------------------------- Pulmonic valve:  Structurally normal valve.  Cusp separation was normal. Doppler: Transvalvular velocity was within the normal range. There was trivial regurgitation.  ------------------------------------------------------------------- Tricuspid valve:  Normal thickness leaflets. Doppler: There was moderate regurgitation.  ------------------------------------------------------------------- Right atrium: The atrium was mildly dilated.  ------------------------------------------------------------------- Pericardium: There was no pericardial effusion.  BMP Latest Ref Rng 01/09/2015 01/08/2015 01/07/2015  Glucose 70 - 99 mg/dL 114(H) 126(H) 107(H)  BUN 6 - 23 mg/dL _0 Creatinine 0.50 - 1.35 mg/dL 1.31 1.32 1.58(H)  Sodium 135 - 145 mmol/L 128(L) 129(L) 128(L)  Potassium 3.5 - 5.1 mmol/L 3.3(L) 2.8(L) 2.9(L)  Chloride 96 - 112 mmol/L 93(L) 91(L) 89(L)  CO2 19 - 32 mmol/L _1 Calcium 8.4 - 10.5 mg/dL 8.3(L) 7.9(L) 8.1(L)        Assessment & Plan:  49 year old male patient with long-standing uncontrolled hypertension recently hospitalized for community-acquired pneumonia, currently on antibiotics and is yet to take antihypertensive this morning; also with new finding of systolic heart failure on 2-D echo.  Accelerated hypertension: Uncontrolled blood pressure secondary to missing morning dose of antihypertensive. Clonidine 0.1 mg given in clinic in blood pressure will be reassessed. Will need reevaluation of blood pressure next office visit. Advised to keep a home BP log and  notify the clinic in the event that he feels lightheaded as he is now on three antihypertensives; I would consider discontinuing Amlodipine if he is hypotensive.  Acute systolic heart failure: 2-D echo findings of LVEF of 35-40%. He is asymptomatic at this time and has no evidence of fluid overload and so I am holding off on Lasix. ACE inhibitor added to aid with cardiac remodeling, beta blocker added as well and he has been scheduled to see cardiology in house.  Community-acquired pneumonia: Asymptomatic; currently on day 3 of Levaquin which was prescribed for 5 day regimen.  Hypokalemia: Basic metabolic panel today. Since I am adding an ACE inhibitor I will hold off on placing him on potassium supplements and he will need a basic metabolic panel at his next visit to determine the need for the latter.  Hyponatremia: This could be secondary to heart failure; we will repeat basic metabolic panel today.  Mediastinal lymphadenopathy: Could be infectious versus inflammatory Follow-up CT ordered against 02/11/15.

## 2015-01-12 NOTE — Patient Instructions (Signed)

## 2015-01-12 NOTE — Progress Notes (Signed)
Patient here following up from hospital visit.  He had pneumonia.  He is feeling better at this time.  He still has a couple days of his antibiotic left.  Patient did not take blood pressure medication yet today.  He says he usually takes it at noon.

## 2015-01-13 LAB — CULTURE, BLOOD (ROUTINE X 2)
Culture: NO GROWTH
Culture: NO GROWTH

## 2015-01-13 LAB — PRO B NATRIURETIC PEPTIDE: PRO B NATRI PEPTIDE: 2840 pg/mL — AB (ref <126)

## 2015-01-13 LAB — BASIC METABOLIC PANEL
BUN: 20 mg/dL (ref 6–23)
CO2: 29 mEq/L (ref 19–32)
Calcium: 8.7 mg/dL (ref 8.4–10.5)
Chloride: 93 mEq/L — ABNORMAL LOW (ref 96–112)
Creat: 1.3 mg/dL (ref 0.50–1.35)
Glucose, Bld: 126 mg/dL — ABNORMAL HIGH (ref 70–99)
POTASSIUM: 3.8 meq/L (ref 3.5–5.3)
SODIUM: 133 meq/L — AB (ref 135–145)

## 2015-01-14 ENCOUNTER — Other Ambulatory Visit: Payer: Self-pay | Admitting: Family Medicine

## 2015-01-14 MED ORDER — POTASSIUM CHLORIDE CRYS ER 20 MEQ PO TBCR: 20.0000 meq | EXTENDED_RELEASE_TABLET | Freq: Every day | ORAL | Status: DC

## 2015-01-14 MED ORDER — FUROSEMIDE 20 MG PO TABS: 20.0000 mg | ORAL_TABLET | Freq: Every day | ORAL | Status: DC

## 2015-01-14 NOTE — Progress Notes (Signed)
Elevated BNP and so i have placed him on low dose Lasix (as he is not overly symptomatic) and potassium.

## 2015-01-15 ENCOUNTER — Telehealth: Payer: Self-pay

## 2015-01-15 NOTE — Telephone Encounter (Signed)
Nurse called patient, patient verified date of birth. Patient aware of need for lasix and potassium due to acute heart failure. Patient voices understanding and agrees to pick up his prescriptions today at listed pharmacy, [facility].

## 2015-01-15 NOTE — Telephone Encounter (Signed)
-----   Message from Jaclyn ShaggyEnobong Amao, MD sent at 01/14/2015  8:30 AM EDT ----- Labs reveal he has acute heart failure and could have fluid overload for which i have sent a script for Lasix and Potassium to his pharmacy. Thanks.

## 2015-01-20 ENCOUNTER — Ambulatory Visit: Payer: Self-pay | Attending: Internal Medicine

## 2015-01-27 ENCOUNTER — Encounter: Payer: Self-pay | Admitting: Cardiology

## 2015-01-27 ENCOUNTER — Ambulatory Visit: Payer: Self-pay | Attending: Cardiology | Admitting: Cardiology

## 2015-01-27 VITALS — BP 184/102 | HR 97 | Temp 98.4°F | Resp 18 | Ht 74.0 in | Wt 255.0 lb

## 2015-01-27 DIAGNOSIS — I5042 Chronic combined systolic (congestive) and diastolic (congestive) heart failure: Secondary | ICD-10-CM

## 2015-01-27 DIAGNOSIS — Z9114 Patient's other noncompliance with medication regimen: Secondary | ICD-10-CM | POA: Insufficient documentation

## 2015-01-27 DIAGNOSIS — I130 Hypertensive heart and chronic kidney disease with heart failure and stage 1 through stage 4 chronic kidney disease, or unspecified chronic kidney disease: Secondary | ICD-10-CM | POA: Insufficient documentation

## 2015-01-27 DIAGNOSIS — I43 Cardiomyopathy in diseases classified elsewhere: Secondary | ICD-10-CM | POA: Insufficient documentation

## 2015-01-27 DIAGNOSIS — I5041 Acute combined systolic (congestive) and diastolic (congestive) heart failure: Secondary | ICD-10-CM | POA: Insufficient documentation

## 2015-01-27 DIAGNOSIS — I10 Essential (primary) hypertension: Secondary | ICD-10-CM

## 2015-01-27 DIAGNOSIS — Z87891 Personal history of nicotine dependence: Secondary | ICD-10-CM | POA: Insufficient documentation

## 2015-01-27 DIAGNOSIS — N182 Chronic kidney disease, stage 2 (mild): Secondary | ICD-10-CM | POA: Insufficient documentation

## 2015-01-27 DIAGNOSIS — I119 Hypertensive heart disease without heart failure: Secondary | ICD-10-CM | POA: Insufficient documentation

## 2015-01-27 DIAGNOSIS — I110 Hypertensive heart disease with heart failure: Secondary | ICD-10-CM

## 2015-01-27 DIAGNOSIS — I340 Nonrheumatic mitral (valve) insufficiency: Secondary | ICD-10-CM | POA: Insufficient documentation

## 2015-01-27 DIAGNOSIS — I509 Heart failure, unspecified: Secondary | ICD-10-CM

## 2015-01-27 NOTE — Progress Notes (Signed)
Pt comes in to f/u with Dr. Daleen SquibbWall for Acute Heart failure- BMP 2840 Pt was instructed to start Lasix 20 mg tab and Potassium 20meq Pt states , he was told by PCP to stop taking Amlodipine,Lisinopril  States he is only taking Carvedilol and Potassium Denies chest pain,sob,swelling or headache BP- 184/102 97

## 2015-01-27 NOTE — Assessment & Plan Note (Signed)
His congestive heart failure secondary to hypertensive cardiomyopathy. He states his blood pressure typically is over 200. He is still hypertensive today but noncompliant. I have restarted all his medications and will have him come back for close follow-up in 2-3 weeks with most likely further adjustments. I've also advised him to lose 25 pounds and to avoid salt. He seems to want to be compliant.

## 2015-01-27 NOTE — Progress Notes (Signed)
HPI [name] comes in today at the request of Dr.Amao for the evaluation and management of his systolic congestive heart failure which is most likely secondary to hypertensive heart disease.  He was recently in the hospital with fever, cough, and congestion. Chest x-ray and CT scan were consistent with multi lobar pneumonia. He has finished his antibiotics and is better. During his hospitalization, he was noted to have edema as well. Echocardiogram showed an ejection fraction of 35-40%, mild left ventricular dilatation, and moderate left ventricular hypertrophy. He has moderate mitral regurgitation with left and right atrial enlargement. Right ventricular systolic pressure was59.   He has not been compliant with his medications. He does not smoke. Does not drink alcohol. 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 He has children. Past Medical History  Diagnosis Date  . CKD (chronic kidney disease) stage 2, GFR 60-89 ml/min   . CAP (community acquired pneumonia) 01/08/2015  . Foreign body, eye 2009  . Hypertension     hx/pt 01/08/2015    Current Outpatient Prescriptions  Medication Sig Dispense Refill  . carvedilol (COREG) 3.125 MG tablet Take 1 tablet (3.125 mg total) by mouth 2 (two) times daily with a meal. 60 tablet 3  . potassium chloride SA (K-DUR,KLOR-CON) 20 MEQ tablet Take 1 tablet (20 mEq total) by mouth daily. 30 tablet 3  . amLODipine (NORVASC) 10 MG tablet Take 1 tablet (10 mg total) by mouth daily. (Patient not taking: Reported on 01/27/2015) 30 tablet 2  . furosemide (LASIX) 20 MG tablet Take 1 tablet (20 mg total) by mouth daily. (Patient not taking: Reported on 01/27/2015) 30 tablet 3  . guaiFENesin-dextromethorphan (ROBITUSSIN DM) 100-10 MG/5ML syrup Take 5 mLs by mouth every 4 (four) hours as needed for cough. (Patient not taking: Reported on 01/27/2015) 118 mL 0  . lisinopril (PRINIVIL) 10 MG tablet Take 1 tablet (10 mg total) by mouth daily. (Patient not taking: Reported on 01/27/2015) 30  tablet 2   No current facility-administered medications for this visit.    Allergies  Allergen Reactions  . Ibuprofen Itching and Swelling    Family History  Problem Relation Age of Onset  . Hypertension Father     History   Social History  . Marital Status: Married    Spouse Name: N/A  . Number of Children: N/A  . Years of Education: N/A   Occupational History  . Not on file.   Social History Main Topics  . Smoking status: Former Smoker -- 1.00 packs/day for 8 years    Types: Cigarettes  . Smokeless tobacco: Never Used     Comment: "quit smoking in ~ 2009  . Alcohol Use: No  . Drug Use: No  . Sexual Activity: Yes   Other Topics Concern  . Not on file   Social History Narrative    ROS ALL NEGATIVE EXCEPT THOSE NOTED IN HPI  PE  General Appearance: well developed, well nourished in no acute distress, large body frame, obese HEENT: symmetrical face, PERRLA, good dentition  Neck: no JVD, thyromegaly, or adenopathy, trachea midline Chest: symmetric without deformity Cardiac: PMI displaced inferolaterally RRR, normal S1, S2, intermittent gallop Lung: clear to ausculation  Vascular: all pulses full without bruits  Abdominal: nondistended, nontender, good bowel sounds, no HSM, no bruits Extremities: no cyanosis, clubbing or edema, no sign of DVT, no varicosities  Skin: normal color, no rashes Neuro: alert and oriented x 3, non-focal Pysch: normal affect  EKG  BMET    Component Value Date/Time  NA 133* 01/12/2015 1213   K 3.8 01/12/2015 1213   CL 93* 01/12/2015 1213   CO2 29 01/12/2015 1213   GLUCOSE 126* 01/12/2015 1213   BUN 20 01/12/2015 1213   CREATININE 1.30 01/12/2015 1213   CREATININE 1.31 01/09/2015 1119   CALCIUM 8.7 01/12/2015 1213   GFRNONAA 64* 01/09/2015 1119   GFRAA 74* 01/09/2015 1119    Lipid Panel     Component Value Date/Time   CHOL 134 01/16/2008 2038   TRIG 70 01/16/2008 2038   HDL 41 01/16/2008 2038   CHOLHDL 3.3 Ratio  01/16/2008 16102038   VLDL 14 01/16/2008 2038   LDLCALC 79 01/16/2008 2038    CBC    Component Value Date/Time   WBC 9.5 01/08/2015 1105   RBC 4.79 01/08/2015 1105   HGB 11.3* 01/08/2015 1105   HCT 33.1* 01/08/2015 1105   PLT 269 01/08/2015 1105   MCV 69.1* 01/08/2015 1105   MCH 23.6* 01/08/2015 1105   MCHC 34.1 01/08/2015 1105   RDW 14.5 01/08/2015 1105   LYMPHSABS 1.9 05/06/2010 2127   MONOABS 0.3 05/06/2010 2127   EOSABS 0.1 05/06/2010 2127   BASOSABS 0.0 05/06/2010 2127

## 2015-01-27 NOTE — Patient Instructions (Addendum)
Thank you for coming in today It was Dr. Daleen SquibbWall pleasure meeting you!  Please take all prescribed medication as ordered Dr. Daleen SquibbWall would like to see you in 3 [facility] Eating Plan DASH stands for "Dietary Approaches to Stop Hypertension." The DASH eating plan is a healthy eating plan that has been shown to reduce high blood pressure (hypertension). Additional health benefits may include reducing the risk of type 2 diabetes mellitus, heart disease, and stroke. The DASH eating plan may also help with weight loss. WHAT DO I NEED TO KNOW ABOUT THE DASH EATING PLAN? For the DASH eating plan, you will follow these general guidelines:  Choose foods with a percent daily value for sodium of less than 5% (as listed on the food label).  Use salt-free seasonings or herbs instead of table salt or sea salt.  Check with your health care provider or pharmacist before using salt substitutes.  Eat lower-sodium products, often labeled as "lower sodium" or "no salt added."  Eat fresh foods.  Eat more vegetables, fruits, and low-fat dairy products.  Choose whole grains. Look for the word "whole" as the first word in the ingredient list.  Choose fish and skinless chicken or [place]key more often than red meat. Limit fish, poultry, and meat to 6 oz (170 g) each day.  Limit sweets, desserts, sugars, and sugary drinks.  Choose heart-healthy fats.  Limit cheese to 1 oz (28 g) per day.  Eat more home-cooked food and less restaurant, buffet, and fast food.  Limit fried foods.  Cook foods using methods other than frying.  Limit canned vegetables. If you do use them, rinse them well to decrease the sodium.  When eating at a restaurant, ask that your food be prepared with less salt, or no salt if possible. WHAT FOODS CAN I EAT? Seek help from a dietitian for individual calorie needs. Grains Whole grain or whole wheat bread. Brown rice. Whole grain or whole wheat pasta. Quinoa, bulgur, and whole grain cereals.  Low-sodium cereals. Corn or whole wheat flour tortillas. Whole grain cornbread. Whole grain crackers. Low-sodium crackers. Vegetables Fresh or frozen vegetables (raw, steamed, roasted, or grilled). Low-sodium or reduced-sodium tomato and vegetable juices. Low-sodium or reduced-sodium tomato sauce and paste. Low-sodium or reduced-sodium canned vegetables.  Fruits All fresh, canned (in natural juice), or frozen fruits. Meat and Other Protein Products Ground beef (85% or leaner), grass-fed beef, or beef trimmed of fat. Skinless chicken or [place]key. Ground chicken or [place]key. Pork trimmed of fat. All fish and seafood. Eggs. Dried beans, peas, or lentils. Unsalted nuts and seeds. Unsalted canned beans. Dairy Low-fat dairy products, such as skim or 1% milk, 2% or reduced-fat cheeses, low-fat ricotta or cottage cheese, or plain low-fat yogurt. Low-sodium or reduced-sodium cheeses. Fats and Oils Tub margarines without trans fats. Light or reduced-fat mayonnaise and salad dressings (reduced sodium). Avocado. Safflower, olive, or canola oils. Natural peanut or almond butter. Other Unsalted popcorn and pretzels. The items listed above may not be a complete list of recommended foods or beverages. Contact your dietitian for more options. WHAT FOODS ARE NOT RECOMMENDED? Grains White bread. White pasta. White rice. Refined cornbread. Bagels and croissants. Crackers that contain trans fat. Vegetables Creamed or fried vegetables. Vegetables in a cheese sauce. Regular canned vegetables. Regular canned tomato sauce and paste. Regular tomato and vegetable juices. Fruits Dried fruits. Canned fruit in light or heavy syrup. Fruit juice. Meat and Other Protein Products Fatty cuts of meat. Ribs, chicken wings, bacon, sausage, bologna, salami, chitterlings, fatback,  hot dogs, bratwurst, and packaged luncheon meats. Salted nuts and seeds. Canned beans with salt. Dairy Whole or 2% milk, cream, half-and-half, and cream  cheese. Whole-fat or sweetened yogurt. Full-fat cheeses or blue cheese. Nondairy creamers and whipped toppings. Processed cheese, cheese spreads, or cheese curds. Condiments Onion and garlic salt, seasoned salt, table salt, and sea salt. Canned and packaged gravies. Worcestershire sauce. Tartar sauce. Barbecue sauce. Teriyaki sauce. Soy sauce, including reduced sodium. Steak sauce. Fish sauce. Oyster sauce. Cocktail sauce. Horseradish. Ketchup and mustard. Meat flavorings and tenderizers. Bouillon cubes. Hot sauce. Tabasco sauce. Marinades. Taco seasonings. Relishes. Fats and Oils Butter, stick margarine, lard, shortening, ghee, and bacon fat. Coconut, palm kernel, or palm oils. Regular salad dressings. Other Pickles and olives. Salted popcorn and pretzels. The items listed above may not be a complete list of foods and beverages to avoid. Contact your dietitian for more information. WHERE CAN I FIND MORE INFORMATION? National Heart, Lung, and Blood Institute: CableProm[employer].itwww.nhlbi.nih.gov/health/health-topics/topics/dash/ Document Released: 08/24/2011 Document Revised: 01/19/2014 Document Reviewed: 07/09/2013 [facility] Patient Information 2015 [place] [place]. This information is not intended to replace advice given to you by your health care provider. Make sure you discuss any questions you have with your health care provider.

## 2015-01-27 NOTE — Assessment & Plan Note (Signed)
We have a lot of work to do." Follow-up in 2-3 weeks with further adjustment in medications.

## 2015-02-10 ENCOUNTER — Ambulatory Visit: Payer: Self-pay | Attending: Cardiology | Admitting: Cardiology

## 2015-02-10 ENCOUNTER — Encounter: Payer: Self-pay | Admitting: Cardiology

## 2015-02-10 VITALS — BP 165/90 | HR 82 | Temp 98.0°F | Resp 18 | Ht 74.0 in | Wt 253.4 lb

## 2015-02-10 DIAGNOSIS — I10 Essential (primary) hypertension: Secondary | ICD-10-CM

## 2015-02-10 DIAGNOSIS — I5042 Chronic combined systolic (congestive) and diastolic (congestive) heart failure: Secondary | ICD-10-CM

## 2015-02-10 MED ORDER — CARVEDILOL 6.25 MG PO TABS: 6.2500 mg | ORAL_TABLET | Freq: Two times a day (BID) | ORAL | Status: DC

## 2015-02-10 MED ORDER — LISINOPRIL 40 MG PO TABS: 40.0000 mg | ORAL_TABLET | Freq: Every day | ORAL | Status: DC

## 2015-02-10 NOTE — Assessment & Plan Note (Signed)
I've increased his carvedilol to 6.25 mg twice a day and increase lisinopril to 40 mg a day. We'll have him return for metabolic profile in a week. I will see him back for follow-up in 4-6 weeks.

## 2015-02-10 NOTE — Progress Notes (Signed)
[name] returns today for titration of his blood pressure and heart failure medicines. His blood pressure has improved to 165/90. He took his meds this morning. 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  His exam today is unchanged. Lungs are clear to auscultation there's no gallop extremity no edema.

## 2015-02-10 NOTE — Progress Notes (Signed)
Patient here for follow up BP. Patient reports feeling good. Patient denies pain at this time.

## 2015-02-10 NOTE — Assessment & Plan Note (Signed)
Blood pressures better today. Increase in carvedilol and lisinopril with follow-up.

## 2015-02-11 ENCOUNTER — Ambulatory Visit (HOSPITAL_COMMUNITY): Admission: RE | Admit: 2015-02-11 | Payer: Self-pay | Source: Ambulatory Visit

## 2015-02-12 ENCOUNTER — Encounter: Payer: Self-pay | Admitting: *Deleted

## 2015-02-12 NOTE — Progress Notes (Signed)
Patient ID: Austin Mccarty, male   DOB: 12/25/1965, 49 y.o.   MRN: [id number]   Patient did not show up for his CT chest with contrast.

## 2015-02-23 ENCOUNTER — Ambulatory Visit: Payer: Self-pay | Attending: Internal Medicine

## 2015-02-23 DIAGNOSIS — I5042 Chronic combined systolic (congestive) and diastolic (congestive) heart failure: Secondary | ICD-10-CM

## 2015-02-23 DIAGNOSIS — I10 Essential (primary) hypertension: Secondary | ICD-10-CM

## 2015-02-23 LAB — BASIC METABOLIC PANEL
BUN: 27 mg/dL — AB (ref 6–23)
CALCIUM: 9.6 mg/dL (ref 8.4–10.5)
CHLORIDE: 94 meq/L — AB (ref 96–112)
CO2: 30 mEq/L (ref 19–32)
Creat: 1.53 mg/dL — ABNORMAL HIGH (ref 0.50–1.35)
Glucose, Bld: 99 mg/dL (ref 70–99)
Potassium: 4.1 mEq/L (ref 3.5–5.3)
Sodium: 132 mEq/L — ABNORMAL LOW (ref 135–145)

## 2015-03-12 ENCOUNTER — Ambulatory Visit: Payer: Self-pay

## 2015-07-13 ENCOUNTER — Other Ambulatory Visit: Payer: Self-pay | Admitting: Family Medicine

## 2015-08-09 ENCOUNTER — Emergency Department (INDEPENDENT_AMBULATORY_CARE_PROVIDER_SITE_OTHER)
Admission: EM | Admit: 2015-08-09 | Discharge: 2015-08-09 | Disposition: A | Discharge status: Home / Self Care | Payer: Self-pay | Source: Home / Self Care | Attending: Family Medicine | Admitting: Family Medicine

## 2015-08-09 ENCOUNTER — Encounter (HOSPITAL_COMMUNITY): Payer: Self-pay | Admitting: *Deleted

## 2015-08-09 DIAGNOSIS — I159 Secondary hypertension, unspecified: Secondary | ICD-10-CM

## 2015-08-09 LAB — POCT I-STAT, CHEM 8
BUN: 27 mg/dL — ABNORMAL HIGH (ref 6–20)
CALCIUM ION: 1.12 mmol/L (ref 1.12–1.23)
CHLORIDE: 92 mmol/L — AB (ref 101–111)
Creatinine, Ser: 2 mg/dL — ABNORMAL HIGH (ref 0.61–1.24)
GLUCOSE: 123 mg/dL — AB (ref 65–99)
HCT: 50 % (ref 39.0–52.0)
Hemoglobin: 17 g/dL (ref 13.0–17.0)
Potassium: 3 mmol/L — ABNORMAL LOW (ref 3.5–5.1)
SODIUM: 134 mmol/L — AB (ref 135–145)
TCO2: 28 mmol/L (ref 0–100)

## 2015-08-09 MED ORDER — CYCLOBENZAPRINE HCL 5 MG PO TABS: 5.0000 mg | ORAL_TABLET | Freq: Three times a day (TID) | ORAL | Status: DC | PRN

## 2015-08-09 NOTE — ED Notes (Signed)
Pt  Reports    l  Leg  Pain    denys  Any   Injury         Pain  Radiates   Down l  Leg       pt  Blood  Pressure  Is  Elevated  Pt  States  He  Forgot to take  His  bp meds  today

## 2015-08-09 NOTE — Discharge Instructions (Signed)
Take your medicine as prescribed and see dr wall on wed as scheduled.

## 2015-08-09 NOTE — ED Provider Notes (Signed)
CSN: [id number]     Arrival date & time 08/09/15  1606 History   First MD Initiated Contact with Patient 08/09/15 1758     Chief Complaint  Patient presents with  . Leg Pain   (Consider location/radiation/quality/duration/timing/severity/associated sxs/prior Treatment) Patient is a 49 y.o. male presenting with leg pain and hypertension. The history is provided by the patient.  Leg Pain Location:  Leg Injury: no   Leg location:  L upper leg Pain details:    Quality:  Sharp   Severity:  No pain   Progression:  Resolved Chronicity:  Recurrent Dislocation: no   Relieved by:  None tried Worsened by:  Bearing weight Ineffective treatments:  None tried Associated symptoms: no decreased ROM, no numbness and no swelling   Hypertension This is a chronic problem. Episode onset: pt not taking meds reg, unaware of current bp elevation.has not taken meds for 2-3 days. Pertinent negatives include no chest pain, no abdominal pain, no headaches and no shortness of breath.    Past Medical History  Diagnosis Date  . CKD (chronic kidney disease) stage 2, GFR 60-89 ml/min   . CAP (community acquired pneumonia) 01/08/2015  . Foreign body, eye 2009  . Hypertension     hx/pt 01/08/2015   Past Surgical History  Procedure Laterality Date  . Eye surgery Right 2009    "removed glass"   Family History  Problem Relation Age of Onset  . Hypertension Father    Social History  Substance Use Topics  . Smoking status: Former Smoker -- 1.00 packs/day for 8 years    Types: Cigarettes  . Smokeless tobacco: Never Used     Comment: "quit smoking in ~ 2009  . Alcohol Use: No    Review of Systems  Constitutional: Negative.   Respiratory: Negative for shortness of breath.   Cardiovascular: Negative for chest pain, palpitations and leg swelling.  Gastrointestinal: Negative for abdominal pain.  Neurological: Negative for headaches.  All other systems reviewed and are negative.   Allergies   Ibuprofen  Home Medications   Prior to Admission medications   Medication Sig Start Date End Date Taking? Authorizing Provider  amLODipine (NORVASC) 10 MG tablet Take 1 tablet (10 mg total) by mouth daily. 01/12/15   Jaclyn ShaggyEnobong Amao, MD  carvedilol (COREG) 6.25 MG tablet Take 1 tablet (6.25 mg total) by mouth 2 (two) times daily with a meal. 02/10/15   Gaylord Shihhomas C Wall, MD  cyclobenzaprine (FLEXERIL) 5 MG tablet Take 1 tablet (5 mg total) by mouth 3 (three) times daily as needed for muscle spasms. 08/09/15   Linna HoffJames D Renold Kozar, MD  furosemide (LASIX) 20 MG tablet Take 1 tablet (20 mg total) by mouth daily. 01/14/15   Jaclyn ShaggyEnobong Amao, MD  guaiFENesin-dextromethorphan (ROBITUSSIN DM) 100-10 MG/5ML syrup Take 5 mLs by mouth every 4 (four) hours as needed for cough. Patient not taking: Reported on 01/27/2015 01/09/15   Nishant Dhungel, MD  lisinopril (PRINIVIL,ZESTRIL) 40 MG tablet Take 1 tablet (40 mg total) by mouth daily. 02/10/15   Gaylord Shihhomas C Wall, MD  potassium chloride SA (K-DUR,KLOR-CON) 20 MEQ tablet Take 1 tablet (20 mEq total) by mouth daily. 01/14/15   Jaclyn ShaggyEnobong Amao, MD   Meds Ordered and Administered this Visit  Medications - No data to display  BP 220/118 mmHg  Pulse 86  Temp(Src) 97.9 F (36.6 C) (Oral)  Resp 18  SpO2 99% No data found.   Physical Exam  Constitutional: He is oriented to person, place, and time. He appears  well-developed and well-nourished.  Neck: Normal range of motion. Neck supple. No thyromegaly present.  Cardiovascular: Normal heart sounds.   Pulmonary/Chest: Effort normal and breath sounds normal.  Musculoskeletal: He exhibits no tenderness.  Lymphadenopathy:    He has no cervical adenopathy.  Neurological: He is alert and oriented to person, place, and time.  Skin: Skin is warm and dry.  Nursing note and vitals reviewed.   ED Course  Procedures (including critical care time)  Labs Review Labs Reviewed  POCT I-STAT, CHEM 8 - Abnormal; Notable for the following:     Sodium 134 (*)    Potassium 3.0 (*)    Chloride 92 (*)    BUN 27 (*)    Creatinine, Ser 2.00 (*)    Glucose, Bld 123 (*)    All other components within normal limits   i-stat as noted.  Imaging Review No results found.   Visual Acuity Review  Right Eye Distance:   Left Eye Distance:   Bilateral Distance:    Right Eye Near:   Left Eye Near:    Bilateral Near:         MDM   1. Accelerated secondary hypertension    Pt states that he has f/up appt with dr wall on wed, will go home and take his med now.    Linna Hoff, MD 08/09/15 [phone]

## 2015-09-22 ENCOUNTER — Other Ambulatory Visit: Payer: Self-pay | Admitting: Family Medicine

## 2015-09-22 MED FILL — LISINOPRIL 40 MG TABLET: 40 | 30 days supply | Qty: 30 | Fill #4

## 2015-09-22 MED FILL — ?AMLODIPINE BESYLATE 10 MG: 10 | 30 days supply | Qty: 30 | Fill #0

## 2015-09-22 MED FILL — CARVEDILOL 6.25 MG TABLET: 6.25 | 30 days supply | Qty: 60 | Fill #3

## 2015-09-29 ENCOUNTER — Emergency Department (HOSPITAL_COMMUNITY): Payer: Medicaid Other

## 2015-09-29 ENCOUNTER — Encounter (HOSPITAL_COMMUNITY): Payer: Self-pay | Admitting: *Deleted

## 2015-09-29 ENCOUNTER — Emergency Department (HOSPITAL_COMMUNITY)
Admission: EM | Admit: 2015-09-29 | Discharge: 2015-09-30 | Disposition: A | Discharge status: Home / Self Care | Payer: Medicaid Other | Attending: Emergency Medicine | Admitting: Emergency Medicine

## 2015-09-29 DIAGNOSIS — N182 Chronic kidney disease, stage 2 (mild): Secondary | ICD-10-CM | POA: Diagnosis not present

## 2015-09-29 DIAGNOSIS — Z79899 Other long term (current) drug therapy: Secondary | ICD-10-CM | POA: Insufficient documentation

## 2015-09-29 DIAGNOSIS — I509 Heart failure, unspecified: Secondary | ICD-10-CM | POA: Diagnosis not present

## 2015-09-29 DIAGNOSIS — R918 Other nonspecific abnormal finding of lung field: Secondary | ICD-10-CM

## 2015-09-29 DIAGNOSIS — Z87891 Personal history of nicotine dependence: Secondary | ICD-10-CM | POA: Insufficient documentation

## 2015-09-29 DIAGNOSIS — R000 Tachycardia, unspecified: Secondary | ICD-10-CM | POA: Insufficient documentation

## 2015-09-29 DIAGNOSIS — I110 Hypertensive heart disease with heart failure: Secondary | ICD-10-CM | POA: Insufficient documentation

## 2015-09-29 DIAGNOSIS — R630 Anorexia: Secondary | ICD-10-CM | POA: Insufficient documentation

## 2015-09-29 DIAGNOSIS — R05 Cough: Secondary | ICD-10-CM | POA: Diagnosis present

## 2015-09-29 DIAGNOSIS — Z87828 Personal history of other (healed) physical injury and trauma: Secondary | ICD-10-CM | POA: Diagnosis not present

## 2015-09-29 DIAGNOSIS — Z8709 Personal history of other diseases of the respiratory system: Secondary | ICD-10-CM | POA: Diagnosis not present

## 2015-09-29 LAB — CBC WITH DIFFERENTIAL/PLATELET
BASOS PCT: 0 %
Basophils Absolute: 0 10*3/uL (ref 0.0–0.1)
EOS ABS: 0.1 10*3/uL (ref 0.0–0.7)
Eosinophils Relative: 1 %
HCT: 34.6 % — ABNORMAL LOW (ref 39.0–52.0)
HEMOGLOBIN: 12.2 g/dL — AB (ref 13.0–17.0)
LYMPHS PCT: 7 %
Lymphs Abs: 1 10*3/uL (ref 0.7–4.0)
MCH: 25 pg — ABNORMAL LOW (ref 26.0–34.0)
MCHC: 35.3 g/dL (ref 30.0–36.0)
MCV: 70.9 fL — ABNORMAL LOW (ref 78.0–100.0)
MONO ABS: 1.3 10*3/uL — AB (ref 0.1–1.0)
Monocytes Relative: 9 %
NEUTROS PCT: 83 %
Neutro Abs: 12.5 10*3/uL — ABNORMAL HIGH (ref 1.7–7.7)
PLATELETS: ADEQUATE 10*3/uL (ref 150–400)
RBC: 4.88 MIL/uL (ref 4.22–5.81)
RDW: 13.9 % (ref 11.5–15.5)
WBC: 14.9 10*3/uL — AB (ref 4.0–10.5)

## 2015-09-29 LAB — BASIC METABOLIC PANEL
ANION GAP: 12 (ref 5–15)
BUN: 19 mg/dL (ref 6–20)
CHLORIDE: 88 mmol/L — AB (ref 101–111)
CO2: 26 mmol/L (ref 22–32)
Calcium: 8.7 mg/dL — ABNORMAL LOW (ref 8.9–10.3)
Creatinine, Ser: 1.56 mg/dL — ABNORMAL HIGH (ref 0.61–1.24)
GFR, EST AFRICAN AMERICAN: 59 mL/min — AB (ref >60)
GFR, EST NON AFRICAN AMERICAN: 51 mL/min — AB (ref >60)
Glucose, Bld: 121 mg/dL — ABNORMAL HIGH (ref 65–99)
POTASSIUM: 3.7 mmol/L (ref 3.5–5.1)
SODIUM: 126 mmol/L — AB (ref 135–145)

## 2015-09-29 LAB — BRAIN NATRIURETIC PEPTIDE: B NATRIURETIC PEPTIDE 5: 993.4 pg/mL — AB (ref 0.0–100.0)

## 2015-09-29 LAB — I-STAT CG4 LACTIC ACID, ED: LACTIC ACID, VENOUS: 1.31 mmol/L (ref 0.5–2.0)

## 2015-09-29 MED ORDER — LEVOFLOXACIN 750 MG PO TABS
750.0000 mg | ORAL_TABLET | Freq: Once | ORAL | Status: AC
  Administered 2015-09-29: 750 mg via ORAL
  Filled 2015-09-29: qty 1

## 2015-09-29 MED ORDER — FUROSEMIDE 20 MG PO TABS: 20.0000 mg | ORAL_TABLET | Freq: Every day | ORAL | Status: DC

## 2015-09-29 MED ORDER — LEVOFLOXACIN 500 MG PO TABS: 500.0000 mg | ORAL_TABLET | Freq: Every day | ORAL | Status: DC

## 2015-09-29 MED ORDER — FUROSEMIDE 20 MG PO TABS
20.0000 mg | ORAL_TABLET | Freq: Once | ORAL | Status: AC
  Administered 2015-09-29: 20 mg via ORAL
  Filled 2015-09-29: qty 1

## 2015-09-29 NOTE — ED Notes (Signed)
No answer for vital sign recheck. 

## 2015-09-29 NOTE — Discharge Instructions (Signed)
Please read and follow all provided instructions.  Your diagnoses today include:  1. Pulmonary infiltrates   2. Hypertensive heart disease with heart failure (HCC)     Tests performed today include:  Blood counts and electrolytes  Chest x-ray -- shows possible pneumonia or heart failure  Blood test for heart failure - shows worsening heart failure compared to last year  Vital signs. See below for your results today.   Medications prescribed:   Levaquin - antibiotic for pneumonia  Take any prescribed medications only as directed.   Lasix - fluid pill  Home care instructions:  Follow any educational materials contained in this packet.  Take the complete course of antibiotics that you were prescribed.   Follow-up instructions: Call your cardiologist tomorrow to schedule a follow-up appointment. This is very important.   Return instructions:   Please return to the Emergency Department if you experience worsening symptoms.   Return immediately with worsening breathing, worsening shortness of breath, or if you feel it is taking you more effort to breathe.   Please return if you have any other emergent concerns.  Additional Information:  Your vital signs today were: BP 177/97 mmHg   Pulse 108   Temp(Src) 98.9 F (37.2 C) (Oral)   Resp 26   Ht 6\' 5"  (1.956 m)   Wt 125.737 kg   BMI 32.86 kg/m2   SpO2 94% If your blood pressure (BP) was elevated above 135/85 this visit, please have this repeated by your doctor within one month. --------------

## 2015-09-29 NOTE — ED Notes (Addendum)
Pt reports several days of productive cough, fever, decreased appetite. No resp distress noted at triage.

## 2015-09-29 NOTE — ED Notes (Signed)
Pt with 02 sat if 90% while ambulating in the hallway.

## 2015-09-29 NOTE — ED Provider Notes (Signed)
Complains of nonproductive cough onset 4 days ago. He denies orthopnea denies peripheral edema. Denies fever. No other associated symptoms. Slept on one pillow. On exam no distress, coughing occasionally. HEENT exam normal cephalic atraumatic neck supple no JVD lungs mildly tachypneic, occasional cough, clear to auscultation. Extremities edema skin warm dry. Patient with elevated BNP over several months ago, may have occult congestive heart failure however he is not orthopneic he also symptoms also consistent with bronchitis  Doug SouSam Orien Mayhall, MD 09/30/15 0045

## 2015-09-29 NOTE — ED Provider Notes (Signed)
CSN: [id number]     Arrival date & time 09/29/15  1527 History   First MD Initiated Contact with Patient 09/29/15 1936     Chief Complaint  Patient presents with  . Cough  . Fever     (Consider location/radiation/quality/duration/timing/severity/associated sxs/prior Treatment) HPI Comments: Patient with history of heart failure (EF 35-40% last year), noncompliant with Lasix treatment, malignant hypertension -- presents with complaint of cough for the past several days. Patient states that he has felt warm but has not had documented fevers. No nausea, vomiting, or diarrhea. He states that he has had associated nasal congestion and runny nose. No sore throat. He denies lower extremity swelling, orthopnea. No significant shortness of breath but states that he does get mildly short of breath when he carries around his son who weighs approximately 45 pounds. He states that symptoms are similar to when he was admitted and 12/2014 and diagnosed with pneumonia and congestive heart failure. Patient denies risk factors for pulmonary embolism including: unilateral leg swelling, history of DVT/PE/other blood clots, use of exogenous hormones, recent immobilizations, recent surgery, recent travel (>4hr segment), malignancy, hemoptysis. The onset of this condition was acute. The course is constant. Aggravating factors: none. Alleviating factors: none.    The history is provided by the patient and medical records.    Past Medical History  Diagnosis Date  . CKD (chronic kidney disease) stage 2, GFR 60-89 ml/min   . CAP (community acquired pneumonia) 01/08/2015  . Foreign body, eye 2009  . Hypertension     hx/pt 01/08/2015   Past Surgical History  Procedure Laterality Date  . Eye surgery Right 2009    "removed glass"   Family History  Problem Relation Age of Onset  . Hypertension Father    Social History  Substance Use Topics  . Smoking status: Former Smoker -- 1.00 packs/day for 8 years    Types:  Cigarettes  . Smokeless tobacco: Never Used     Comment: "quit smoking in ~ 2009  . Alcohol Use: No    Review of Systems  Constitutional: Positive for appetite change. Negative for fever and chills.  HENT: Negative for rhinorrhea and sore throat.   Eyes: Negative for redness.  Respiratory: Positive for cough.   Cardiovascular: Negative for chest pain.  Gastrointestinal: Negative for nausea, vomiting, abdominal pain and diarrhea.  Genitourinary: Negative for dysuria.  Musculoskeletal: Negative for myalgias.  Skin: Negative for rash.  Neurological: Negative for headaches.      Allergies  Ibuprofen  Home Medications   Prior to Admission medications   Medication Sig Start Date End Date Taking? Authorizing Provider  carvedilol (COREG) 6.25 MG tablet Take 1 tablet (6.25 mg total) by mouth 2 (two) times daily with a meal. 02/10/15  Yes Gaylord Shihhomas C Wall, MD  lisinopril (PRINIVIL,ZESTRIL) 40 MG tablet Take 1 tablet (40 mg total) by mouth daily. 02/10/15  Yes Gaylord Shihhomas C Wall, MD  potassium chloride SA (K-DUR,KLOR-CON) 20 MEQ tablet Take 1 tablet (20 mEq total) by mouth daily. 01/14/15  Yes Jaclyn ShaggyEnobong Amao, MD  amLODipine (NORVASC) 10 MG tablet Take 1 tablet (10 mg total) by mouth daily. Needs office visit for more refills Patient not taking: Reported on 09/29/2015 09/22/15   Quentin Angstlugbemiga E Jegede, MD  cyclobenzaprine (FLEXERIL) 5 MG tablet Take 1 tablet (5 mg total) by mouth 3 (three) times daily as needed for muscle spasms. 08/09/15   Linna HoffJames D Kindl, MD  furosemide (LASIX) 20 MG tablet Take 1 tablet (20 mg total) by mouth  daily. Patient not taking: Reported on 09/29/2015 01/14/15   [name], MD  guaiFENesin-dextromethorphan (ROBITUSSIN DM) 100-10 MG/5ML syrup Take 5 mLs by mouth every 4 (four) hours as needed for cough. Patient not taking: Reported on 01/27/2015 01/09/15   Nishant Dhungel, MD   BP 159/114 mmHg  Pulse 104  Temp(Src) 98.9 F (37.2 C) (Oral)  Resp 22  Ht 6\' 5"  (1.956 m)  Wt 125.737  kg  BMI 32.86 kg/m2  SpO2 96%   Physical Exam  Constitutional: He appears well-developed and well-nourished.  HENT:  Head: Normocephalic and atraumatic.  Mouth/Throat: Mucous membranes are normal. Mucous membranes are not dry.  Eyes: Conjunctivae are normal. Right eye exhibits no discharge. Left eye exhibits no discharge.  Neck: Trachea normal and normal range of motion. Neck supple. Normal carotid pulses and no JVD present. No muscular tenderness present. Carotid bruit is not present. No tracheal deviation present.  Cardiovascular: Regular rhythm, S1 normal, S2 normal, normal heart sounds and intact distal pulses.  Tachycardia present.  Exam reveals no distant heart sounds and no decreased pulses.   No murmur heard. Pulmonary/Chest: Effort normal and breath sounds normal. No respiratory distress. He has no wheezes. He exhibits no tenderness.  Speaks in full sentences without distress.  Abdominal: Soft. Normal aorta and bowel sounds are normal. There is no tenderness. There is no rebound and no guarding.  Musculoskeletal: He exhibits no edema.  Neurological: He is alert.  Skin: Skin is warm and dry. He is not diaphoretic. No cyanosis. No pallor.  Psychiatric: He has a normal mood and affect.  Nursing note and vitals reviewed.   ED Course  Procedures (including critical care time) Labs Review Labs Reviewed  CBC WITH DIFFERENTIAL/PLATELET - Abnormal; Notable for the following:    WBC 14.9 (*)    Hemoglobin 12.2 (*)    HCT 34.6 (*)    MCV 70.9 (*)    MCH 25.0 (*)    Neutro Abs 12.5 (*)    Monocytes Absolute 1.3 (*)    All other components within normal limits  BASIC METABOLIC PANEL - Abnormal; Notable for the following:    Sodium 126 (*)    Chloride 88 (*)    Glucose, Bld 121 (*)    Creatinine, Ser 1.56 (*)    Calcium 8.7 (*)    GFR calc non Af Amer 51 (*)    GFR calc Af Amer 59 (*)    All other components within normal limits  BRAIN NATRIURETIC PEPTIDE - Abnormal; Notable  for the following:    B Natriuretic Peptide 993.4 (*)    All other components within normal limits  I-STAT CG4 LACTIC ACID, ED    Imaging Review Dg Chest 2 View  09/29/2015  CLINICAL DATA:  Cough and fever or 1 week EXAM: CHEST - 2 VIEW COMPARISON:  01/08/2015 FINDINGS: Cardiac shadow is mildly prominent but stable in appearance from the prior exam. There again noted bilateral multifocal infiltrative densities right slightly worse than left. They have is increased slightly in the interval from the prior exam. IMPRESSION: Recurrent bilateral infiltrates right greater than left. Although pulmonary edema deserves consideration these likely represent a multifocal infectious etiology. Electronically Signed   By: Alcide Clever M.D.   On: 09/29/2015 16:24   I have personally reviewed and evaluated these images and lab results as part of my medical decision-making.   EKG Interpretation   Date/Time:  Wednesday September 29 2015 20:18:35 EST Ventricular Rate:  109 PR Interval:  162  QRS Duration: 107 QT Interval:  335 QTC Calculation: 451 R Axis:   44 Text Interpretation:  Sinus tachycardia Probable left atrial enlargement  LVH with secondary repolarization abnormality Anterior ST elevation,  probably due to LVH No significant change since last tracing Confirmed by  Ethelda Chick  MD, SAM [phone]) on 09/29/2015 8:31:00 PM       8:14 PM Patient seen and examined. Work-up initiated. Medications ordered.   Vital signs reviewed and are as follows: BP 159/114 mmHg  Pulse 104  Temp(Src) 98.9 F (37.2 C) (Oral)  Resp 22  Ht 6\' 5"  (1.956 m)  Wt 125.737 kg  BMI 32.86 kg/m2  SpO2 96%  9:35 PM Pulse ox 92-93% on RA, drops to 90% with ambulation.   Patient discussed with and seen by Dr. Ethelda Chick. After discussion, our recommendation is for the patient to be admitted to the hospital for delineation of pneumonia versus heart failure exacerbation.  Patient offered admission to hospital but he adamantly  declines. He would like to be treated with antibiotics and Lasix and follow-up with his cardiologist group. He states that he is going to call tomorrow for an appointment. He also states that he is going to come back if symptoms worsen including worsening shortness of breath, fever, new symptoms.  We spent a lot of time discussing concerning findings today. This includes his elevated heart rate, drop in oxygen saturation when he ambulates, previous chest imaging similar to today in the past which would make pneumonia less likely. I told patient that his true diagnosis is unclear at this point. These factors make him higher risk that he could go home and get worse. He verbalizes that he understands this risk. He again reassures me that he will come back if symptoms become worse.   D/c to home. First dose Levaquin and Lasix prior to discharge.    MDM   Final diagnoses:  Pulmonary infiltrates  Hypertensive heart disease with heart failure (HCC)   Patient with cough. Chest x-ray with bilateral infiltrates of unclear origin. This may represent pneumonia or may represent pulmonary edema. Patient has a history of both. Elevated white blood cell count today and tachycardia which may indicate infection however he has not had a fever and has had increased brain natruretic peptide. He is not compliant with his medications for congestive heart failure, although does not have increased leg swelling. He gets short of breath with extreme exertion. No lower extremity swelling or symptoms of DVT/PE at this point. Patient ambulates comfortably here but oxygen saturation does drop to 90%. He has not had a fever. (despite nursing note he has denied this to both me and Dr. Rennis Chris). Patient offered and recommended admission however he declines at this point. He appears capable of making medical decisions. He appears to understand the risks and is agreeable to return if symptoms worsen. Patient discharged treatment as  above. Encouraged very close follow-up with his cardiologist for further evaluation.    Renne Crigler, PA-C 09/30/15 0034  Doug Sou, MD 09/30/15 [phone]

## 2015-09-29 NOTE — ED Notes (Signed)
Pt left with all his belongings and ambulated out of the treatment area.  

## 2015-09-30 MED FILL — levoFLOXacin 500 MG TABS: 500 | 7 days supply | Qty: 7 | Fill #0

## 2015-09-30 MED FILL — ?FUROSEMIDE 20 MG TABLET: 20 | 14 days supply | Qty: 14 | Fill #0

## 2015-10-03 ENCOUNTER — Inpatient Hospital Stay (HOSPITAL_COMMUNITY)
Admission: EM | Admit: 2015-10-03 | Discharge: 2015-10-05 | DRG: 871 | Disposition: A | Discharge status: Home / Self Care | Payer: Medicaid Other | Attending: Internal Medicine | Admitting: Internal Medicine

## 2015-10-03 ENCOUNTER — Emergency Department (HOSPITAL_COMMUNITY): Payer: Medicaid Other

## 2015-10-03 ENCOUNTER — Encounter (HOSPITAL_COMMUNITY): Payer: Self-pay | Admitting: Emergency Medicine

## 2015-10-03 DIAGNOSIS — Z87891 Personal history of nicotine dependence: Secondary | ICD-10-CM | POA: Diagnosis not present

## 2015-10-03 DIAGNOSIS — I509 Heart failure, unspecified: Secondary | ICD-10-CM

## 2015-10-03 DIAGNOSIS — D509 Iron deficiency anemia, unspecified: Secondary | ICD-10-CM | POA: Diagnosis present

## 2015-10-03 DIAGNOSIS — E871 Hypo-osmolality and hyponatremia: Secondary | ICD-10-CM | POA: Diagnosis present

## 2015-10-03 DIAGNOSIS — I5023 Acute on chronic systolic (congestive) heart failure: Secondary | ICD-10-CM | POA: Diagnosis present

## 2015-10-03 DIAGNOSIS — R0602 Shortness of breath: Secondary | ICD-10-CM | POA: Diagnosis present

## 2015-10-03 DIAGNOSIS — Z79899 Other long term (current) drug therapy: Secondary | ICD-10-CM

## 2015-10-03 DIAGNOSIS — Z886 Allergy status to analgesic agent status: Secondary | ICD-10-CM

## 2015-10-03 DIAGNOSIS — I130 Hypertensive heart and chronic kidney disease with heart failure and stage 1 through stage 4 chronic kidney disease, or unspecified chronic kidney disease: Secondary | ICD-10-CM | POA: Diagnosis present

## 2015-10-03 DIAGNOSIS — J189 Pneumonia, unspecified organism: Secondary | ICD-10-CM | POA: Diagnosis present

## 2015-10-03 DIAGNOSIS — J9601 Acute respiratory failure with hypoxia: Secondary | ICD-10-CM | POA: Diagnosis present

## 2015-10-03 DIAGNOSIS — N182 Chronic kidney disease, stage 2 (mild): Secondary | ICD-10-CM | POA: Diagnosis present

## 2015-10-03 DIAGNOSIS — I10 Essential (primary) hypertension: Secondary | ICD-10-CM | POA: Diagnosis present

## 2015-10-03 DIAGNOSIS — J209 Acute bronchitis, unspecified: Secondary | ICD-10-CM | POA: Diagnosis present

## 2015-10-03 DIAGNOSIS — A419 Sepsis, unspecified organism: Secondary | ICD-10-CM | POA: Diagnosis not present

## 2015-10-03 DIAGNOSIS — I5043 Acute on chronic combined systolic (congestive) and diastolic (congestive) heart failure: Secondary | ICD-10-CM

## 2015-10-03 LAB — BASIC METABOLIC PANEL
ANION GAP: 12 (ref 5–15)
BUN: 21 mg/dL — AB (ref 6–20)
CHLORIDE: 89 mmol/L — AB (ref 101–111)
CO2: 27 mmol/L (ref 22–32)
Calcium: 8.3 mg/dL — ABNORMAL LOW (ref 8.9–10.3)
Creatinine, Ser: 1.46 mg/dL — ABNORMAL HIGH (ref 0.61–1.24)
GFR calc Af Amer: >60 mL/min (ref >60)
GFR, EST NON AFRICAN AMERICAN: 56 mL/min — AB (ref >60)
GLUCOSE: 138 mg/dL — AB (ref 65–99)
POTASSIUM: 3.7 mmol/L (ref 3.5–5.1)
Sodium: 128 mmol/L — ABNORMAL LOW (ref 135–145)

## 2015-10-03 LAB — BRAIN NATRIURETIC PEPTIDE: B NATRIURETIC PEPTIDE 5: 1176.2 pg/mL — AB (ref 0.0–100.0)

## 2015-10-03 LAB — CBC
HCT: 30.7 % — ABNORMAL LOW (ref 39.0–52.0)
HEMOGLOBIN: 10.7 g/dL — AB (ref 13.0–17.0)
MCH: 24.4 pg — AB (ref 26.0–34.0)
MCHC: 34.9 g/dL (ref 30.0–36.0)
MCV: 70.1 fL — AB (ref 78.0–100.0)
PLATELETS: 387 10*3/uL (ref 150–400)
RBC: 4.38 MIL/uL (ref 4.22–5.81)
RDW: 14.4 % (ref 11.5–15.5)
WBC: 13.4 10*3/uL — AB (ref 4.0–10.5)

## 2015-10-03 LAB — I-STAT TROPONIN, ED: Troponin i, poc: 0.03 ng/mL (ref 0.00–0.08)

## 2015-10-03 MED ORDER — NITROGLYCERIN 2 % TD OINT
1.0000 [in_us] | TOPICAL_OINTMENT | Freq: Once | TRANSDERMAL | Status: AC
  Administered 2015-10-03: 1 [in_us] via TOPICAL
  Filled 2015-10-03: qty 1

## 2015-10-03 MED ORDER — FUROSEMIDE 10 MG/ML IJ SOLN
40.0000 mg | Freq: Once | INTRAMUSCULAR | Status: AC
  Administered 2015-10-03: 40 mg via INTRAVENOUS
  Filled 2015-10-03: qty 4

## 2015-10-03 NOTE — ED Notes (Addendum)
C/o bilateral lower extremity edema, sob with exertion, and lower back pain since yesterday.  States he was seen here on 1/11 for pneumonia.  Taking antibiotics. Pt states diagnosed with ? CHF by Dr. Daleen SquibbWall last year.  States Dr. Daleen SquibbWall stopped his Lasix in September.

## 2015-10-03 NOTE — ED Provider Notes (Signed)
CSN: [id number]     Arrival date & time 10/03/15  1850 History   First MD Initiated Contact with Patient 10/03/15 2024     Chief Complaint  Patient presents with  . Leg Swelling  . Shortness of Breath     (Consider location/radiation/quality/duration/timing/severity/associated sxs/prior Treatment) HPI Patient complains of worsening bilateral leg edema since leaving the ED 09/28/2014. He was seen here on that day, complaint of cough shortness of breath. He was felt to be in congestive heart failure. He states that he has not been on Lasix in several months. He denies cough. Admits to two-pillow orthopnea. No other associated symptoms. He left here 09/28/2014 after treatment with Lasix, prescription for Levaquin and Lasix written. He reports that he no longer coughing. Past Medical History  Diagnosis Date  . CKD (chronic kidney disease) stage 2, GFR 60-89 ml/min   . CAP (community acquired pneumonia) 01/08/2015  . Foreign body, eye 2009  . Hypertension     hx/pt 01/08/2015   Past Surgical History  Procedure Laterality Date  . Eye surgery Right 2009    "removed glass"   Family History  Problem Relation Age of Onset  . Hypertension Father    Social History  Substance Use Topics  . Smoking status: Former Smoker -- 1.00 packs/day for 8 years    Types: Cigarettes  . Smokeless tobacco: Never Used     Comment: "quit smoking in ~ 2009  . Alcohol Use: No    Review of Systems  Respiratory: Positive for shortness of breath.   Cardiovascular: Positive for leg swelling.  All other systems reviewed and are negative.     Allergies  Ibuprofen  Home Medications   Prior to Admission medications   Medication Sig Start Date End Date Taking? Authorizing Provider  carvedilol (COREG) 6.25 MG tablet Take 1 tablet (6.25 mg total) by mouth 2 (two) times daily with a meal. 02/10/15   Gaylord Shih, MD  cyclobenzaprine (FLEXERIL) 5 MG tablet Take 1 tablet (5 mg total) by mouth 3 (three) times  daily as needed for muscle spasms. 08/09/15   Linna Hoff, MD  furosemide (LASIX) 20 MG tablet Take 1 tablet (20 mg total) by mouth daily. 09/29/15   Renne Crigler, PA-C  levofloxacin (LEVAQUIN) 500 MG tablet Take 1 tablet (500 mg total) by mouth daily. 09/29/15   Renne Crigler, PA-C  lisinopril (PRINIVIL,ZESTRIL) 40 MG tablet Take 1 tablet (40 mg total) by mouth daily. 02/10/15   Gaylord Shih, MD  potassium chloride SA (K-DUR,KLOR-CON) 20 MEQ tablet Take 1 tablet (20 mEq total) by mouth daily. 01/14/15   [name], MD   BP 161/103 mmHg  Pulse 96  Temp(Src) 99.5 F (37.5 C) (Oral)  Resp 26  Ht 6\' 4"  (1.93 m)  Wt 278 lb (126.1 kg)  BMI 33.85 kg/m2  SpO2 93% Physical Exam  Constitutional: He appears well-developed and well-nourished. No distress.  HENT:  Head: Normocephalic and atraumatic.  Eyes: Conjunctivae are normal. Pupils are equal, round, and reactive to light.  Neck: Neck supple. JVD present. No tracheal deviation present. No thyromegaly present.  Cardiovascular: Normal rate and regular rhythm.   No murmur heard. Pulmonary/Chest: Effort normal.  Rales right base  Abdominal: Soft. Bowel sounds are normal. He exhibits no distension. There is no tenderness.  Musculoskeletal: Normal range of motion. He exhibits edema. He exhibits no tenderness.  1+ pretibial pitting edema bilaterally  Neurological: He is alert. Coordination normal.  Skin: Skin is warm and dry.  No rash noted.  Psychiatric: He has a normal mood and affect.  Nursing note and vitals reviewed.   ED Course  Procedures (including critical care time) Labs Review Labs Reviewed  BASIC METABOLIC PANEL - Abnormal; Notable for the following:    Sodium 128 (*)    Chloride 89 (*)    Glucose, Bld 138 (*)    BUN 21 (*)    Creatinine, Ser 1.46 (*)    Calcium 8.3 (*)    GFR calc non Af Amer 56 (*)    All other components within normal limits  CBC - Abnormal; Notable for the following:    WBC 13.4 (*)    Hemoglobin  10.7 (*)    HCT 30.7 (*)    MCV 70.1 (*)    MCH 24.4 (*)    All other components within normal limits  BRAIN NATRIURETIC PEPTIDE  I-STAT TROPOININ, ED    Imaging Review Dg Chest 2 View  10/03/2015  CLINICAL DATA:  Shortness of breath and lower extremity edema. Chronic renal failure EXAM: CHEST  2 VIEW COMPARISON:  September 29, 2015 FINDINGS: There is widespread interstitial and alveolar opacity throughout the lungs, stable. There is cardiomegaly with pulmonary venous hypertension. No adenopathy. No appreciable effusions. No demonstrable bone lesions. IMPRESSION: Widespread interstitial and alveolar opacity bilaterally with cardiomegaly and pulmonary venous hypertension. Differential considerations for this extensive abnormality include congestive heart failure. Widespread pneumonia, Goodpasture's disease with hemorrhage, hypersensitivity pneumonitis, and ARDS. More than 1 of these entities may well exist concurrently. The appearance is stable compared to recent prior study. Electronically Signed   By: Bretta BangWilliam  Woodruff III M.D.   On: 10/03/2015 20:39   I have personally reviewed and evaluated these images and lab results as part of my medical decision-making.   EKG Interpretation   Date/Time:  Sunday October 03 2015 19:08:06 EST Ventricular Rate:  101 PR Interval:  158 QRS Duration: 102 QT Interval:  364 QTC Calculation: 471 R Axis:   18 Text Interpretation:  Sinus tachycardia Possible Left atrial enlargement  Left ventricular hypertrophy with repolarization abnormality Abnormal ECG  No significant change since last tracing Confirmed by Ivonne Freeburg  MD, Kenadee Gates  ([id number]) on 10/03/2015 8:42:09 PM     11 :35 PM patient feels improved after treatment with intravenous Lasix and topical nitrates. Chest x-ray viewed by me Results for orders placed or performed during the hospital encounter of 10/03/15  Basic metabolic panel  Result Value Ref Range   Sodium 128 (L) 135 - 145 mmol/L   Potassium  3.7 3.5 - 5.1 mmol/L   Chloride 89 (L) 101 - 111 mmol/L   CO2 27 22 - 32 mmol/L   Glucose, Bld 138 (H) 65 - 99 mg/dL   BUN 21 (H) 6 - 20 mg/dL   Creatinine, Ser 0.451.46 (H) 0.61 - 1.24 mg/dL   Calcium 8.3 (L) 8.9 - 10.3 mg/dL   GFR calc non Af Amer 56 (L) >60 mL/min   GFR calc Af Amer >60 >60 mL/min   Anion gap 12 5 - 15  CBC  Result Value Ref Range   WBC 13.4 (H) 4.0 - 10.5 K/uL   RBC 4.38 4.22 - 5.81 MIL/uL   Hemoglobin 10.7 (L) 13.0 - 17.0 g/dL   HCT 40.930.7 (L) 81.139.0 - 91.452.0 %   MCV 70.1 (L) 78.0 - 100.0 fL   MCH 24.4 (L) 26.0 - 34.0 pg   MCHC 34.9 30.0 - 36.0 g/dL   RDW 78.214.4 95.611.5 - 21.315.5 %  Platelets 387 150 - 400 K/uL  Brain natriuretic peptide  Result Value Ref Range   B Natriuretic Peptide 1176.2 (H) 0.0 - 100.0 pg/mL  I-stat troponin, ED (not at [facility])  Result Value Ref Range   Troponin i, poc 0.03 0.00 - 0.08 ng/mL   Comment 3           Dg Chest 2 View  10/03/2015  CLINICAL DATA:  Shortness of breath and lower extremity edema. Chronic renal failure EXAM: CHEST  2 VIEW COMPARISON:  September 29, 2015 FINDINGS: There is widespread interstitial and alveolar opacity throughout the lungs, stable. There is cardiomegaly with pulmonary venous hypertension. No adenopathy. No appreciable effusions. No demonstrable bone lesions. IMPRESSION: Widespread interstitial and alveolar opacity bilaterally with cardiomegaly and pulmonary venous hypertension. Differential considerations for this extensive abnormality include congestive heart failure. Widespread pneumonia, Goodpasture's disease with hemorrhage, hypersensitivity pneumonitis, and ARDS. More than 1 of these entities may well exist concurrently. The appearance is stable compared to recent prior study. Electronically Signed   By: Bretta Bang III M.D.   On: 10/03/2015 20:39   Dg Chest 2 View  09/29/2015  CLINICAL DATA:  Cough and fever or 1 week EXAM: CHEST - 2 VIEW COMPARISON:  01/08/2015 FINDINGS: Cardiac shadow is mildly prominent  but stable in appearance from the prior exam. There again noted bilateral multifocal infiltrative densities right slightly worse than left. They have is increased slightly in the interval from the prior exam. IMPRESSION: Recurrent bilateral infiltrates right greater than left. Although pulmonary edema deserves consideration these likely represent a multifocal infectious etiology. Electronically Signed   By: Alcide Clever M.D.   On: 09/29/2015 16:24    MDM  Clinically patient in congestive heart failure.  Anemia, renal insufficiency and hyponatremia are chronic.With elevated BNP, leg edema, vein distention and dyspnea and rales on lung exam He may have resolving pneumonia. Cough has improved  I spoke with Dr.Kakrakandy plan admit to telemetry Final diagnoses:  None   Dx #1 CHF #2 anemia #3 renal insufficiency #4 hyponatremia     Doug Sou, MD 10/03/15 2345

## 2015-10-04 ENCOUNTER — Inpatient Hospital Stay (HOSPITAL_COMMUNITY): Payer: Medicaid Other

## 2015-10-04 ENCOUNTER — Encounter (HOSPITAL_COMMUNITY): Payer: Self-pay | Admitting: Internal Medicine

## 2015-10-04 DIAGNOSIS — I5023 Acute on chronic systolic (congestive) heart failure: Secondary | ICD-10-CM | POA: Diagnosis present

## 2015-10-04 DIAGNOSIS — I10 Essential (primary) hypertension: Secondary | ICD-10-CM | POA: Diagnosis present

## 2015-10-04 DIAGNOSIS — J9601 Acute respiratory failure with hypoxia: Secondary | ICD-10-CM

## 2015-10-04 DIAGNOSIS — J209 Acute bronchitis, unspecified: Secondary | ICD-10-CM | POA: Diagnosis present

## 2015-10-04 DIAGNOSIS — I509 Heart failure, unspecified: Secondary | ICD-10-CM | POA: Insufficient documentation

## 2015-10-04 DIAGNOSIS — I5043 Acute on chronic combined systolic (congestive) and diastolic (congestive) heart failure: Secondary | ICD-10-CM

## 2015-10-04 LAB — CBC WITH DIFFERENTIAL/PLATELET
BASOS ABS: 0 10*3/uL (ref 0.0–0.1)
BASOS PCT: 0 %
Eosinophils Absolute: 0.2 10*3/uL (ref 0.0–0.7)
Eosinophils Relative: 1 %
HEMATOCRIT: 30.5 % — AB (ref 39.0–52.0)
HEMOGLOBIN: 10.4 g/dL — AB (ref 13.0–17.0)
Lymphocytes Relative: 7 %
Lymphs Abs: 0.9 10*3/uL (ref 0.7–4.0)
MCH: 23.9 pg — ABNORMAL LOW (ref 26.0–34.0)
MCHC: 34.1 g/dL (ref 30.0–36.0)
MCV: 70 fL — ABNORMAL LOW (ref 78.0–100.0)
MONOS PCT: 9 %
Monocytes Absolute: 1.1 10*3/uL — ABNORMAL HIGH (ref 0.1–1.0)
NEUTROS ABS: 10.4 10*3/uL — AB (ref 1.7–7.7)
NEUTROS PCT: 83 %
Platelets: 364 10*3/uL (ref 150–400)
RBC: 4.36 MIL/uL (ref 4.22–5.81)
RDW: 14.3 % (ref 11.5–15.5)
WBC: 12.6 10*3/uL — AB (ref 4.0–10.5)

## 2015-10-04 LAB — COMPREHENSIVE METABOLIC PANEL
ALK PHOS: 93 U/L (ref 38–126)
ALT: 43 U/L (ref 17–63)
ANION GAP: 10 (ref 5–15)
AST: 35 U/L (ref 15–41)
Albumin: 1.9 g/dL — ABNORMAL LOW (ref 3.5–5.0)
BUN: 19 mg/dL (ref 6–20)
CALCIUM: 8.1 mg/dL — AB (ref 8.9–10.3)
CHLORIDE: 94 mmol/L — AB (ref 101–111)
CO2: 26 mmol/L (ref 22–32)
Creatinine, Ser: 1.46 mg/dL — ABNORMAL HIGH (ref 0.61–1.24)
GFR calc non Af Amer: 56 mL/min — ABNORMAL LOW (ref >60)
Glucose, Bld: 150 mg/dL — ABNORMAL HIGH (ref 65–99)
Potassium: 3.6 mmol/L (ref 3.5–5.1)
SODIUM: 130 mmol/L — AB (ref 135–145)
Total Bilirubin: 0.8 mg/dL (ref 0.3–1.2)
Total Protein: 6 g/dL — ABNORMAL LOW (ref 6.5–8.1)

## 2015-10-04 LAB — GLUCOSE, CAPILLARY: Glucose-Capillary: 213 mg/dL — ABNORMAL HIGH (ref 65–99)

## 2015-10-04 LAB — RETICULOCYTES
RBC.: 4.45 MIL/uL (ref 4.22–5.81)
RETIC COUNT ABSOLUTE: 124.6 10*3/uL (ref 19.0–186.0)
Retic Ct Pct: 2.8 % (ref 0.4–3.1)

## 2015-10-04 LAB — FOLATE: FOLATE: 10.5 ng/mL (ref >5.9)

## 2015-10-04 LAB — VITAMIN B12: Vitamin B-12: 168 pg/mL — ABNORMAL LOW (ref 180–914)

## 2015-10-04 LAB — IRON AND TIBC
Iron: 21 ug/dL — ABNORMAL LOW (ref 45–182)
SATURATION RATIOS: 11 % — AB (ref 17.9–39.5)
TIBC: 196 ug/dL — ABNORMAL LOW (ref 250–450)
UIBC: 175 ug/dL

## 2015-10-04 LAB — TROPONIN I
TROPONIN I: 0.03 ng/mL (ref <0.031)
TROPONIN I: 0.03 ng/mL (ref <0.031)
Troponin I: 0.03 ng/mL (ref <0.031)

## 2015-10-04 LAB — HIV ANTIBODY (ROUTINE TESTING W REFLEX): HIV SCREEN 4TH GENERATION: NONREACTIVE

## 2015-10-04 LAB — INFLUENZA PANEL BY PCR (TYPE A & B)
H1N1 flu by pcr: NOT DETECTED
INFLAPCR: NEGATIVE
Influenza B By PCR: NEGATIVE

## 2015-10-04 LAB — FERRITIN: FERRITIN: 361 ng/mL — AB (ref 24–336)

## 2015-10-04 LAB — STREP PNEUMONIAE URINARY ANTIGEN: STREP PNEUMO URINARY ANTIGEN: NEGATIVE

## 2015-10-04 MED ORDER — ACETAMINOPHEN 650 MG RE SUPP: 650.0000 mg | Freq: Four times a day (QID) | RECTAL | Status: DC | PRN

## 2015-10-04 MED ORDER — AMLODIPINE BESYLATE 10 MG PO TABS
10.0000 mg | ORAL_TABLET | Freq: Every day | ORAL | Status: DC
  Administered 2015-10-04: 10 mg via ORAL
  Filled 2015-10-04 (×2): qty 1

## 2015-10-04 MED ORDER — CARVEDILOL 6.25 MG PO TABS
6.2500 mg | ORAL_TABLET | Freq: Two times a day (BID) | ORAL | Status: DC
  Administered 2015-10-04 – 2015-10-05 (×3): 6.25 mg via ORAL
  Filled 2015-10-04 (×3): qty 1

## 2015-10-04 MED ORDER — DOXYCYCLINE HYCLATE 100 MG IV SOLR
100.0000 mg | Freq: Two times a day (BID) | INTRAVENOUS | Status: DC
  Administered 2015-10-04: 100 mg via INTRAVENOUS
  Filled 2015-10-04 (×2): qty 100

## 2015-10-04 MED ORDER — DEXTROSE 5 % IV SOLN
500.0000 mg | Freq: Every day | INTRAVENOUS | Status: DC
  Administered 2015-10-04 – 2015-10-05 (×2): 500 mg via INTRAVENOUS
  Filled 2015-10-04 (×2): qty 500

## 2015-10-04 MED ORDER — DEXTROSE 5 % IV SOLN
1.0000 g | Freq: Every day | INTRAVENOUS | Status: DC
  Administered 2015-10-04 – 2015-10-05 (×2): 1 g via INTRAVENOUS
  Filled 2015-10-04 (×2): qty 10

## 2015-10-04 MED ORDER — LISINOPRIL 40 MG PO TABS
40.0000 mg | ORAL_TABLET | Freq: Every day | ORAL | Status: DC
  Administered 2015-10-04 – 2015-10-05 (×2): 40 mg via ORAL
  Filled 2015-10-04 (×2): qty 1

## 2015-10-04 MED ORDER — ONDANSETRON HCL 4 MG/2ML IJ SOLN: 4.0000 mg | Freq: Four times a day (QID) | INTRAMUSCULAR | Status: DC | PRN

## 2015-10-04 MED ORDER — ACETAMINOPHEN 325 MG PO TABS: 650.0000 mg | ORAL_TABLET | Freq: Four times a day (QID) | ORAL | Status: DC | PRN

## 2015-10-04 MED ORDER — FUROSEMIDE 10 MG/ML IJ SOLN: 40.0000 mg | Freq: Two times a day (BID) | INTRAMUSCULAR | Status: DC

## 2015-10-04 MED ORDER — SODIUM CHLORIDE 0.9 % IJ SOLN
3.0000 mL | Freq: Two times a day (BID) | INTRAMUSCULAR | Status: DC
  Administered 2015-10-04 – 2015-10-05 (×3): 3 mL via INTRAVENOUS

## 2015-10-04 MED ORDER — ONDANSETRON HCL 4 MG PO TABS: 4.0000 mg | ORAL_TABLET | Freq: Four times a day (QID) | ORAL | Status: DC | PRN

## 2015-10-04 MED ORDER — FUROSEMIDE 10 MG/ML IJ SOLN
40.0000 mg | Freq: Every day | INTRAMUSCULAR | Status: DC
  Administered 2015-10-04 – 2015-10-05 (×2): 40 mg via INTRAVENOUS
  Filled 2015-10-04 (×2): qty 4

## 2015-10-04 MED ORDER — ENOXAPARIN SODIUM 60 MG/0.6ML ~~LOC~~ SOLN
60.0000 mg | SUBCUTANEOUS | Status: DC
  Administered 2015-10-04: 60 mg via SUBCUTANEOUS
  Filled 2015-10-04: qty 0.6

## 2015-10-04 MED ORDER — IPRATROPIUM-ALBUTEROL 0.5-2.5 (3) MG/3ML IN SOLN
RESPIRATORY_TRACT | Status: AC
  Filled 2015-10-04: qty 3

## 2015-10-04 NOTE — H&P (Signed)
[facility] History and Physical  Raja Liska ZOX:[id number] DOB: 06/28/1966 DOA: 10/03/2015  Referring physician: Dr. Rennis Chris. PCP: PROVIDER NOT IN SYSTEM used to follow-up with Dr. Daleen Squibb cardiologist. Specialists: Dr. Daleen Squibb. Cardiologist.  Chief Complaint: Shortness of breath.  HPI: Austin Mccarty is a 50 y.o. male with history of systolic heart failure, chronic kidney disease and hypertension presents to the ER because of worsening shortness of breath. Patient had come to the ER with complaints of shortness of breath last week and was discharged home on Levaquin which patient states he has taken as advised. Despite taking which patient is still having shortness of breath with productive cough. Patient also states he has noticed increasing swelling in the lower extremity. Patient is mildly febrile with leukocytosis and on exam patient is still having productive cough. Patient will be admitted for acute respiratory failure probably from a combination of CHF and pneumonia. Denies any nausea vomiting or diarrhea. Patient was admitted last year for pneumonia.   Review of Systems: As presented in the history of presenting illness, rest negative.  Past Medical History  Diagnosis Date  . CKD (chronic kidney disease) stage 2, GFR 60-89 ml/min   . CAP (community acquired pneumonia) 01/08/2015  . Foreign body, eye 2009  . Hypertension     hx/pt 01/08/2015   Past Surgical History  Procedure Laterality Date  . Eye surgery Right 2009    "removed glass"   Social History:  reports that he has quit smoking. His smoking use included Cigarettes. He has a 8 pack-year smoking history. He has never used smokeless tobacco. He reports that he does not drink alcohol or use illicit drugs. Where does patient live home. Can patient participate in ADLs? Yes.  Allergies  Allergen Reactions  . Ibuprofen Itching and Swelling    Family History:  Family History  Problem Relation Age of Onset   . Hypertension Father       Prior to Admission medications   Medication Sig Start Date End Date Taking? Authorizing Provider  carvedilol (COREG) 6.25 MG tablet Take 1 tablet (6.25 mg total) by mouth 2 (two) times daily with a meal. 02/10/15  Yes Gaylord Shih, MD  furosemide (LASIX) 20 MG tablet Take 1 tablet (20 mg total) by mouth daily. 09/29/15  Yes Renne Crigler, PA-C  levofloxacin (LEVAQUIN) 500 MG tablet Take 1 tablet (500 mg total) by mouth daily. 09/29/15  Yes Renne Crigler, PA-C  lisinopril (PRINIVIL,ZESTRIL) 40 MG tablet Take 1 tablet (40 mg total) by mouth daily. 02/10/15  Yes Gaylord Shih, MD  amLODipine (NORVASC) 10 MG tablet Take 10 mg by mouth daily.    Historical Provider, MD  cyclobenzaprine (FLEXERIL) 5 MG tablet Take 1 tablet (5 mg total) by mouth 3 (three) times daily as needed for muscle spasms. 08/09/15   Linna Hoff, MD  potassium chloride SA (K-DUR,KLOR-CON) 20 MEQ tablet Take 1 tablet (20 mEq total) by mouth daily. 01/14/15   [name], MD    Physical Exam: Filed Vitals:   10/03/15 2230 10/03/15 2300 10/04/15 0015 10/04/15 0050  BP: 157/98 143/81 144/79 156/90  Pulse: 103 100 109 107  Temp:    99 F (37.2 C)  TempSrc:    Oral  Resp: 38 21 16 20   Height:    6\' 1"  (1.854 m)  Weight:    123.514 kg (272 lb 4.8 oz)  SpO2: 94% 93% 94% 90%     General:  Moderately built and nourished.  Eyes: Anicteric  no pallor.  ENT: No discharge from the ears eyes nose or mouth.  Neck: No mass felt. JVD mildly elevated.  Cardiovascular: S1 and S2 heard.  Respiratory: No rhonchi or crepitations.  Abdomen: Soft nontender bowel sounds present. No guarding or rigidity.  Skin: No rash.  Musculoskeletal: Mild bilateral lower extremity edema.  Psychiatric: Appears normal.  Neurologic: Alert awake oriented to time place and person. Moves all extremities.  Labs on Admission:  Basic Metabolic Panel:  Recent Labs Lab 09/29/15 2013 10/03/15 1940  NA 126* 128*  K  3.7 3.7  CL 88* 89*  CO2 26 27  GLUCOSE 121* 138*  BUN 19 21*  CREATININE 1.56* 1.46*  CALCIUM 8.7* 8.3*   Liver Function Tests: No results for input(s): AST, ALT, ALKPHOS, BILITOT, PROT, ALBUMIN in the last 168 hours. No results for input(s): LIPASE, AMYLASE in the last 168 hours. No results for input(s): AMMONIA in the last 168 hours. CBC:  Recent Labs Lab 09/29/15 2013 10/03/15 1940  WBC 14.9* 13.4*  NEUTROABS 12.5*  --   HGB 12.2* 10.7*  HCT 34.6* 30.7*  MCV 70.9* 70.1*  PLT PLATELET CLUMPS NOTED ON SMEAR, COUNT APPEARS ADEQUATE 387   Cardiac Enzymes: No results for input(s): CKTOTAL, CKMB, CKMBINDEX, TROPONINI in the last 168 hours.  BNP (last 3 results)  Recent Labs  01/07/15 2010 09/29/15 2013 10/03/15 2212  BNP 551.8* 993.4* 1176.2*    ProBNP (last 3 results)  Recent Labs  01/12/15 1213  PROBNP 2840.00*    CBG: No results for input(s): GLUCAP in the last 168 hours.  Radiological Exams on Admission: Dg Chest 2 View  10/03/2015  CLINICAL DATA:  Shortness of breath and lower extremity edema. Chronic renal failure EXAM: CHEST  2 VIEW COMPARISON:  September 29, 2015 FINDINGS: There is widespread interstitial and alveolar opacity throughout the lungs, stable. There is cardiomegaly with pulmonary venous hypertension. No adenopathy. No appreciable effusions. No demonstrable bone lesions. IMPRESSION: Widespread interstitial and alveolar opacity bilaterally with cardiomegaly and pulmonary venous hypertension. Differential considerations for this extensive abnormality include congestive heart failure. Widespread pneumonia, Goodpasture's disease with hemorrhage, hypersensitivity pneumonitis, and ARDS. More than 1 of these entities may well exist concurrently. The appearance is stable compared to recent prior study. Electronically Signed   By: Bretta BangWilliam  Woodruff III M.D.   On: 10/03/2015 20:39    EKG: Independently reviewed. Sinus tachycardia with  LVH.  Assessment/Plan Principal Problem:   Acute respiratory failure with hypoxia (HCC) Active Problems:   CHF (congestive heart failure) (HCC)   Systolic CHF, acute on chronic (HCC)   Acute bronchitis   Hypertension, uncontrolled   CHF (congestive heart failure), NYHA class I (HCC)   1. Acute respiratory failure with hypoxia probably from a combination of pneumonia and CHF - since patient has persistent cough and x-rays show widespread interstitial and alveolar opacity I have ordered CT chest without contrast. Patient states that after he has multiple cough sputum is mild blood-tinged but no frank hemoptysis. Denies any weight loss. Patient has gained weight actually. At this time we will keep patient on ceftriaxone and Zithromax for possible pneumonia and check respiratory virus panel, strep antigen and urine Legionella antigen and HIV status. For possible CHF patient has been placed on Lasix 40 mg IV daily. Closely follow intake output and metabolic panel and daily weights. 2. Hypertension uncontrolled - patient has had previously known uncontrolled hypertension. For now we will continue present medication and adjust medications while inpatient if still uncontrolled. 3. Chronic  kidney disease stage II - creatinine appears to be at baseline. Closely follow metabolic panel. Patient is on IV Lasix.  4. Chronic systolic heart failure last year measured was 35-40% - not on ACE inhibitor due to renal failure. See #1. 5. Chronic anemia - follow CBC. Patient will need further anemia workup as outpatient.   DVT Prophylaxis Lovenox.  Code Status: Full code.  Family Communication: Discussed with patient's brother at the bedside.  Disposition Plan: Admit to inpatient.    Denvil Canning N. [facility] Pager [phone].  If 7PM-7AM, please contact night-coverage [email or web address] Password [email or web address] 10/04/2015, 1:49 AM

## 2015-10-04 NOTE — Progress Notes (Signed)
Utilization review completed. Helayne Metsker, RN, BSN. 

## 2015-10-04 NOTE — Progress Notes (Signed)
Pt arrived to floor in NAD, O2 90% on 2Lnc, pt increased to 3Lnc, pt A/Ox4, steady on feet, refused bed alarm.

## 2015-10-04 NOTE — Progress Notes (Signed)
   [facility]                                                                              Patient Demographics  Austin Mccarty, is a 50 y.o. male, DOB - 04/22/1966, ONG:[id number]  Admit date - 10/03/2015   Admitting Physician Eduard ClosArshad N Kakrakandy, MD  Outpatient Primary MD for the patient is PROVIDER NOT IN SYSTEM  LOS - 1   Chief Complaint  Patient presents with  . Leg Swelling  . Shortness of Breath        Assessment & Plan   Patient admitted earlier today by Dr. Midge MiniumArshad Kakrakandy.  Full H&P pending.    Acute respiratory failure with hypoxia/multifocal pneumonia -Continue azithromycin and ceftriaxone -CT chest showed bilateral airspace opacities most compatible with multifocal pneumonia -Continue supplemental oxygen to maintain saturation above 92% -Influenza negative, pending respiratory viral panel -Strep pneumonia urine antigen negative  Sepsis secondary to pneumonia -Upon admission, patient did have leukocytosis with tachycardia and low-grade fever -Continue treatment plan as above -Blood Cultures pending  Acute on chronic systolic CHF -BNP 1176 -Echocardiogram: 01/09/2015 showed EF of 35-40% -Monitor intake and output, daily weights -Continue Lasix, lisinopril, Coreg  Hypertension -Continue Coreg, amlodipine, Lasix, lisinopril  Chronic kidney disease stage II -Creatinine appears to be close to baseline, currently 1.46 -Continue to monitor BMP  Chronic microcytic anemia -Hemoglobin currently 10.4 -Will obtain anemia panel and continue to monitor CBC  Code Status:Full  Family Communication: None at bedside  Disposition Plan: Admitted  Time Spent in minutes   30 minutes  Procedures  None  Consults   None  DVT Prophylaxis  Lovenox   Drelyn Pistilli D.O. on 10/04/2015 at 2:05 PM  Between 7am to 7pm - Pager - [phone]  After 7pm go to [email or web address] - password [email or web address]  And look for the night coverage person covering for me after  hours  [facility] Office  [phone]

## 2015-10-05 DIAGNOSIS — J209 Acute bronchitis, unspecified: Secondary | ICD-10-CM

## 2015-10-05 DIAGNOSIS — I10 Essential (primary) hypertension: Secondary | ICD-10-CM

## 2015-10-05 DIAGNOSIS — J189 Pneumonia, unspecified organism: Secondary | ICD-10-CM

## 2015-10-05 DIAGNOSIS — I5023 Acute on chronic systolic (congestive) heart failure: Secondary | ICD-10-CM

## 2015-10-05 LAB — BASIC METABOLIC PANEL
ANION GAP: 9 (ref 5–15)
BUN: 19 mg/dL (ref 6–20)
CALCIUM: 8.1 mg/dL — AB (ref 8.9–10.3)
CO2: 30 mmol/L (ref 22–32)
CREATININE: 1.48 mg/dL — AB (ref 0.61–1.24)
Chloride: 89 mmol/L — ABNORMAL LOW (ref 101–111)
GFR, EST NON AFRICAN AMERICAN: 55 mL/min — AB (ref >60)
Glucose, Bld: 121 mg/dL — ABNORMAL HIGH (ref 65–99)
POTASSIUM: 3.5 mmol/L (ref 3.5–5.1)
Sodium: 128 mmol/L — ABNORMAL LOW (ref 135–145)

## 2015-10-05 LAB — CBC
HCT: 30.9 % — ABNORMAL LOW (ref 39.0–52.0)
HEMOGLOBIN: 10.5 g/dL — AB (ref 13.0–17.0)
MCH: 24 pg — ABNORMAL LOW (ref 26.0–34.0)
MCHC: 34 g/dL (ref 30.0–36.0)
MCV: 70.7 fL — ABNORMAL LOW (ref 78.0–100.0)
PLATELETS: 362 10*3/uL (ref 150–400)
RBC: 4.37 MIL/uL (ref 4.22–5.81)
RDW: 14.6 % (ref 11.5–15.5)
WBC: 11 10*3/uL — ABNORMAL HIGH (ref 4.0–10.5)

## 2015-10-05 LAB — RESPIRATORY VIRUS PANEL
Adenovirus: NEGATIVE
INFLUENZA A: NEGATIVE
INFLUENZA B 1: NEGATIVE
METAPNEUMOVIRUS: NEGATIVE
PARAINFLUENZA 1 A: NEGATIVE
Parainfluenza 2: NEGATIVE
Parainfluenza 3: NEGATIVE
RESPIRATORY SYNCYTIAL VIRUS A: NEGATIVE
RESPIRATORY SYNCYTIAL VIRUS B: NEGATIVE
Rhinovirus: NEGATIVE

## 2015-10-05 MED ORDER — CEFUROXIME AXETIL 500 MG PO TABS: 500.0000 mg | ORAL_TABLET | Freq: Two times a day (BID) | ORAL | Status: DC

## 2015-10-05 MED FILL — CEFUROXIME AXETIL 500 MG TA: 500 | 5 days supply | Qty: 10 | Fill #0

## 2015-10-05 NOTE — Discharge Summary (Signed)
Physician Discharge Summary  Austin Mccarty WUJ:[id number] DOB: 07-20-1966 DOA: 10/03/2015  PCP: PROVIDER NOT IN SYSTEM  Admit date: 10/03/2015 Discharge date: 10/05/2015  Time spent: 45 minutes  Recommendations for Outpatient Follow-up:  Patient will be discharged to home.  Patient will need to follow up with primary care provider within one week of discharge.  Patient should continue medications as prescribed.  Patient should follow a heart healthy diet.   Discharge Diagnoses:  Acute respiratory failure with hypoxia/multifocal pneumonia Sepsis secondary to pneumonia Acute on chronic systolic CHF  hypertension Chronic kidney disease, stage II Chronic microcytic anemia  Discharge Condition: Stable  Diet recommendation: Heart healthy  Filed Weights   10/03/15 1904 10/04/15 0050 10/05/15 0433  Weight: 126.1 kg (278 lb) 123.514 kg (272 lb 4.8 oz) 123.514 kg (272 lb 4.8 oz)    History of present illness:  On 10/04/2015 by Dr. Midge Minium Austin Mccarty is a 50 y.o. male with history of systolic heart failure, chronic kidney disease and hypertension presents to the ER because of worsening shortness of breath. Patient had come to the ER with complaints of shortness of breath last week and was discharged home on Levaquin which patient states he has taken as advised. Despite taking which patient is still having shortness of breath with productive cough. Patient also states he has noticed increasing swelling in the lower extremity. Patient is mildly febrile with leukocytosis and on exam patient is still having productive cough. Patient will be admitted for acute respiratory failure probably from a combination of CHF and pneumonia. Denies any nausea vomiting or diarrhea. Patient was admitted last year for pneumonia.   Hospital Course:  Acute respiratory failure with hypoxia/multifocal pneumonia -initially placed on azithromycin and ceftriaxone -CT chest showed bilateral airspace  opacities most compatible with multifocal pneumonia -Continue supplemental oxygen to maintain saturation above 92% -Influenza negative, pending respiratory viral panel -Strep pneumonia urine antigen negative -Will discharge patient with ceftin  Sepsis secondary to pneumonia -Upon admission, patient did have leukocytosis with tachycardia and low-grade fever -Continue treatment plan as above -Blood Cultures show no growth to date  Acute on chronic systolic CHF -BNP 1176 -Echocardiogram: 01/09/2015 showed EF of 35-40% -Monitor intake and output, daily weights -Continue Lasix, lisinopril, Coreg  Hypertension -Continue Coreg, amlodipine, Lasix, lisinopril  Chronic kidney disease stage II -Creatinine appears to be close to baseline, currently 1.48  Chronic microcytic anemia -Hemoglobin currently 10.5 -Iron 21, ferritin 361 -Continue outpatient workup and monitoring  Procedures  None  Consults  None  Discharge Exam: Filed Vitals:   10/05/15 0802 10/05/15 1031  BP: 133/78 146/82  Pulse: 85   Temp: 98.5 F (36.9 C)   Resp: 18      General: Well developed, well nourished, NAD, appears stated age  HEENT: NCAT,mucous membranes moist.  Cardiovascular: S1 S2 auscultated, no rubs, murmurs or gallops. Regular rate and rhythm.  Respiratory: Clear to auscultation  Abdomen: Soft, nontender, nondistended, + bowel sounds  Extremities: warm dry without cyanosis clubbing or edema  Neuro: AAOx3, nonfocal  Psych: Normal affect and demeanor with intact judgement and insight  Discharge Instructions      Discharge Instructions    Discharge instructions    Complete by:  As directed   Patient will be discharged to home.  Patient will need to follow up with primary care provider within one week of discharge.  Patient should continue medications as prescribed.  Patient should follow a heart healthy diet.  Medication List    STOP taking these medications          levofloxacin 500 MG tablet  Commonly known as:  LEVAQUIN      TAKE these medications        amLODipine 10 MG tablet  Commonly known as:  NORVASC  Take 10 mg by mouth daily.     carvedilol 6.25 MG tablet  Commonly known as:  COREG  Take 1 tablet (6.25 mg total) by mouth 2 (two) times daily with a meal.     cefUROXime 500 MG tablet  Commonly known as:  CEFTIN  Take 1 tablet (500 mg total) by mouth 2 (two) times daily with a meal.     cyclobenzaprine 5 MG tablet  Commonly known as:  FLEXERIL  Take 1 tablet (5 mg total) by mouth 3 (three) times daily as needed for muscle spasms.     furosemide 20 MG tablet  Commonly known as:  LASIX  Take 1 tablet (20 mg total) by mouth daily.     lisinopril 40 MG tablet  Commonly known as:  PRINIVIL,ZESTRIL  Take 1 tablet (40 mg total) by mouth daily.     potassium chloride SA 20 MEQ tablet  Commonly known as:  K-DUR,KLOR-CON  Take 1 tablet (20 mEq total) by mouth daily.       Allergies  Allergen Reactions  . Ibuprofen Itching and Swelling   Follow-up Information    Follow up with [name], MD. Go on 10/08/2015.   Specialty:  Family Medicine   Why:  ;30pm   Contact information:   [address] [place] [address] [phone]        The results of significant diagnostics from this hospitalization (including imaging, microbiology, ancillary and laboratory) are listed below for reference.    Significant Diagnostic Studies: Dg Chest 2 View  10/03/2015  CLINICAL DATA:  Shortness of breath and lower extremity edema. Chronic renal failure EXAM: CHEST  2 VIEW COMPARISON:  September 29, 2015 FINDINGS: There is widespread interstitial and alveolar opacity throughout the lungs, stable. There is cardiomegaly with pulmonary venous hypertension. No adenopathy. No appreciable effusions. No demonstrable bone lesions. IMPRESSION: Widespread interstitial and alveolar opacity bilaterally with cardiomegaly and pulmonary venous  hypertension. Differential considerations for this extensive abnormality include congestive heart failure. Widespread pneumonia, Goodpasture's disease with hemorrhage, hypersensitivity pneumonitis, and ARDS. More than 1 of these entities may well exist concurrently. The appearance is stable compared to recent prior study. Electronically Signed   By: Bretta Bang III M.D.   On: 10/03/2015 20:39   Dg Chest 2 View  09/29/2015  CLINICAL DATA:  Cough and fever or 1 week EXAM: CHEST - 2 VIEW COMPARISON:  01/08/2015 FINDINGS: Cardiac shadow is mildly prominent but stable in appearance from the prior exam. There again noted bilateral multifocal infiltrative densities right slightly worse than left. They have is increased slightly in the interval from the prior exam. IMPRESSION: Recurrent bilateral infiltrates right greater than left. Although pulmonary edema deserves consideration these likely represent a multifocal infectious etiology. Electronically Signed   By: Alcide Clever M.D.   On: 09/29/2015 16:24   Ct Chest Wo Contrast  10/04/2015  CLINICAL DATA:  50 year old male with shortness of breath EXAM: CT CHEST WITHOUT CONTRAST TECHNIQUE: Multidetector CT imaging of the chest was performed following the standard protocol without IV contrast. COMPARISON:  Chest radiograph dated 10/03/2015 and CT dated 01/08/2015 FINDINGS: Evaluation of this exam is limited in the absence of intravenous  contrast. There is multi focal airspace opacities involving upper and lower lobes bilaterally with areas of air bronchogram most compatible with multifocal pneumonia. Pulmonary edema is not excluded. Clinical correlation is recommended. There is no pleural effusion or pneumothorax. The central airways are patent. The thoracic aorta and pulmonary arteries are grossly unremarkable on this noncontrast study. There is no cardiomegaly or pericardial effusion. There is mild approximation of the cardiac blood pool suggestive of a degree of  anemia. Mildly enlarged paratracheal and mediastinal adenopathy. The esophagus is grossly unremarkable. No thyroid nodule identified. The chest wall soft tissues appear unremarkable. Mild degenerative changes of the spine. Old right seventh rib fracture. No acute fracture. The visualized upper abdomen appears unremarkable. IMPRESSION: Bilateral airspace opacity most compatible with multifocal pneumonia. Underlying edema is not excluded. Clinical correlation and follow-up recommended. Electronically Signed   By: Elgie Collard M.D.   On: 10/04/2015 02:43    Microbiology: Recent Results (from the past 240 hour(s))  Culture, blood (routine x 2) Call MD if unable to obtain prior to antibiotics being given     Status: None (Preliminary result)   Collection Time: 10/04/15  4:03 AM  Result Value Ref Range Status   Specimen Description BLOOD RIGHT HAND  Final   Special Requests BOTTLES DRAWN AEROBIC ONLY 5CC  Final   Culture NO GROWTH 1 DAY  Final   Report Status PENDING  Incomplete  Culture, blood (routine x 2) Call MD if unable to obtain prior to antibiotics being given     Status: None (Preliminary result)   Collection Time: 10/04/15  4:08 AM  Result Value Ref Range Status   Specimen Description BLOOD RIGHT HAND  Final   Special Requests BOTTLES DRAWN AEROBIC ONLY 5CC  Final   Culture NO GROWTH 1 DAY  Final   Report Status PENDING  Incomplete     Labs: Basic Metabolic Panel:  Recent Labs Lab 09/29/15 2013 10/03/15 1940 10/04/15 0408 10/05/15 0553  NA 126* 128* 130* 128*  K 3.7 3.7 3.6 3.5  CL 88* 89* 94* 89*  CO2 GLUCOSE 121* 138* 150* 121*  BUN 19 21* 19 19  CREATININE 1.56* 1.46* 1.46* 1.48*  CALCIUM 8.7* 8.3* 8.1* 8.1*   Liver Function Tests:  Recent Labs Lab 10/04/15 0408  AST 35  ALT 43  ALKPHOS 93  BILITOT 0.8  PROT 6.0*  ALBUMIN 1.9*   No results for input(s): LIPASE, AMYLASE in the last 168 hours. No results for input(s): AMMONIA in the last 168  hours. CBC:  Recent Labs Lab 09/29/15 2013 10/03/15 1940 10/04/15 0408 10/05/15 0553  WBC 14.9* 13.4* 12.6* 11.0*  NEUTROABS 12.5*  --  10.4*  --   HGB 12.2* 10.7* 10.4* 10.5*  HCT 34.6* 30.7* 30.5* 30.9*  MCV 70.9* 70.1* 70.0* 70.7*  PLT PLATELET CLUMPS NOTED ON SMEAR, COUNT APPEARS ADEQUATE 387 364 362   Cardiac Enzymes:  Recent Labs Lab 10/04/15 0408 10/04/15 0817 10/04/15 1415  TROPONINI 0.03 0.03 0.03   BNP: BNP (last 3 results)  Recent Labs  01/07/15 2010 09/29/15 2013 10/03/15 2212  BNP 551.8* 993.4* 1176.2*    ProBNP (last 3 results)  Recent Labs  01/12/15 1213  PROBNP 2840.00*    CBG:  Recent Labs Lab 10/04/15 2219  GLUCAP 213*       Signed:  Mika Griffitts  [facility] 10/05/2015, 11:31 AM

## 2015-10-05 NOTE — Discharge Instructions (Signed)
Heart Failure °Heart failure is a condition in which the heart has trouble pumping blood. This means your heart does not pump blood efficiently for your body to work well. In some cases of heart failure, fluid may back up into your lungs or you may have swelling (edema) in your lower legs. Heart failure is usually a long-term (chronic) condition. It is important for you to take good care of yourself and follow your health care provider's treatment plan. °CAUSES  °Some health conditions can cause heart failure. Those health conditions include: °· High blood pressure (hypertension). Hypertension causes the heart muscle to work harder than normal. When pressure in the blood vessels is high, the heart needs to pump (contract) with more force in order to circulate blood throughout the body. High blood pressure eventually causes the heart to become stiff and weak. °· Coronary artery disease (CAD). CAD is the buildup of cholesterol and fat (plaque) in the arteries of the heart. The blockage in the arteries deprives the heart muscle of oxygen and blood. This can cause chest pain and may lead to a heart attack. High blood pressure can also contribute to CAD. °· Heart attack (myocardial infarction). A heart attack occurs when one or more arteries in the heart become blocked. The loss of oxygen damages the muscle tissue of the heart. When this happens, part of the heart muscle dies. The injured tissue does not contract as well and weakens the heart's ability to pump blood. °· Abnormal heart valves. When the heart valves do not open and close properly, it can cause heart failure. This makes the heart muscle pump harder to keep the blood flowing. °· Heart muscle disease (cardiomyopathy or myocarditis). Heart muscle disease is damage to the heart muscle from a variety of causes. These can include drug or alcohol abuse, infections, or unknown reasons. These can increase the risk of heart failure. °· Lung disease. Lung disease  makes the heart work harder because the lungs do not work properly. This can cause a strain on the heart, leading it to fail. °· Diabetes. Diabetes increases the risk of heart failure. High blood sugar contributes to high fat (lipid) levels in the blood. Diabetes can also cause slow damage to tiny blood vessels that carry important nutrients to the heart muscle. When the heart does not get enough oxygen and food, it can cause the heart to become weak and stiff. This leads to a heart that does not contract efficiently. °· Other conditions can contribute to heart failure. These include abnormal heart rhythms, thyroid problems, and low blood counts (anemia). °Certain unhealthy behaviors can increase the risk of heart failure, including: °· Being overweight. °· Smoking or chewing tobacco. °· Eating foods high in fat and cholesterol. °· Abusing illicit drugs or alcohol. °· Lacking physical activity. °SYMPTOMS  °Heart failure symptoms may vary and can be hard to detect. Symptoms may include: °· Shortness of breath with activity, such as climbing stairs. °· Persistent cough. °· Swelling of the feet, ankles, legs, or abdomen. °· Unexplained weight gain. °· Difficulty breathing when lying flat (orthopnea). °· Waking from sleep because of the need to sit up and get more air. °· Rapid heartbeat. °· Fatigue and loss of energy. °· Feeling light-headed, dizzy, or close to fainting. °· Loss of appetite. °· Nausea. °· Increased urination during the night (nocturia). °DIAGNOSIS  °A diagnosis of heart failure is based on your history, symptoms, physical examination, and diagnostic tests. Diagnostic tests for heart failure may include: °·   Echocardiography. °· Electrocardiography. °· Chest X-ray. °· Blood tests. °· Exercise stress test. °· Cardiac angiography. °· Radionuclide scans. °TREATMENT  °Treatment is aimed at managing the symptoms of heart failure. Medicines, behavioral changes, or surgical intervention may be necessary to  treat heart failure. °· Medicines to help treat heart failure may include: °· Angiotensin-converting enzyme (ACE) inhibitors. This type of medicine blocks the effects of a blood protein called angiotensin-converting enzyme. ACE inhibitors relax (dilate) the blood vessels and help lower blood pressure. °· Angiotensin receptor blockers (ARBs). This type of medicine blocks the actions of a blood protein called angiotensin. Angiotensin receptor blockers dilate the blood vessels and help lower blood pressure. °· Water pills (diuretics). Diuretics cause the kidneys to remove salt and water from the blood. The extra fluid is removed through urination. This loss of extra fluid lowers the volume of blood the heart pumps. °· Beta blockers. These prevent the heart from beating too fast and improve heart muscle strength. °· Digitalis. This increases the force of the heartbeat. °· Healthy behavior changes include: °· Obtaining and maintaining a healthy weight. °· Stopping smoking or chewing tobacco. °· Eating heart-healthy foods. °· Limiting or avoiding alcohol. °· Stopping illicit drug use. °· Physical activity as directed by your health care provider. °· Surgical treatment for heart failure may include: °· A procedure to open blocked arteries, repair damaged heart valves, or remove damaged heart muscle tissue. °· A pacemaker to improve heart muscle function and control certain abnormal heart rhythms. °· An internal cardioverter defibrillator to treat certain serious abnormal heart rhythms. °· A left ventricular assist device (LVAD) to assist the pumping ability of the heart. °HOME CARE INSTRUCTIONS  °· Take medicines only as directed by your health care provider. Medicines are important in reducing the workload of your heart, slowing the progression of heart failure, and improving your symptoms. °¨ Do not stop taking your medicine unless directed by your health care provider. °¨ Do not skip any dose of medicine. °¨ Refill your  prescriptions before you run out of medicine. Your medicines are needed every day. °· Engage in moderate physical activity if directed by your health care provider. Moderate physical activity can benefit some people. The elderly and people with severe heart failure should consult with a health care provider for physical activity recommendations. °· Eat heart-healthy foods. Food choices should be free of trans fat and low in saturated fat, cholesterol, and salt (sodium). Healthy choices include fresh or frozen fruits and vegetables, fish, lean meats, legumes, fat-free or low-fat dairy products, and whole grain or high fiber foods. Talk to a dietitian to learn more about heart-healthy foods. °· Limit sodium if directed by your health care provider. Sodium restriction may reduce symptoms of heart failure in some people. Talk to a dietitian to learn more about heart-healthy seasonings. °· Use healthy cooking methods. Healthy cooking methods include roasting, grilling, broiling, baking, poaching, steaming, or stir-frying. Talk to a dietitian to learn more about healthy cooking methods. °· Limit fluids if directed by your health care provider. Fluid restriction may reduce symptoms of heart failure in some people. °· Weigh yourself every day. Daily weights are important in the early recognition of excess fluid. You should weigh yourself every morning after you urinate and before you eat breakfast. Wear the same amount of clothing each time you weigh yourself. Record your daily weight. Provide your health care provider with your weight record. °· Monitor and record your blood pressure if directed by your health care   provider. °· Check your pulse if directed by your health care provider. °· Lose weight if directed by your health care provider. Weight loss may reduce symptoms of heart failure in some people. °· Stop smoking or chewing tobacco. Nicotine makes your heart work harder by causing your blood vessels to constrict.  Do not use nicotine gum or patches before talking to your health care provider. °· Keep all follow-up visits as directed by your health care provider. This is important. °· Limit alcohol intake to no more than 1 drink per day for nonpregnant women and 2 drinks per day for men. One drink equals 12 ounces of beer, 5 ounces of wine, or 1½ ounces of hard liquor. Drinking more than that is harmful to your heart. Tell your health care provider if you drink alcohol several times a week. Talk with your health care provider about whether alcohol is safe for you. If your heart has already been damaged by alcohol or you have severe heart failure, drinking alcohol should be stopped completely. °· Stop illicit drug use. °· Stay up-to-date with immunizations. It is especially important to prevent respiratory infections through current pneumococcal and influenza immunizations. °· Manage other health conditions such as hypertension, diabetes, thyroid disease, or abnormal heart rhythms as directed by your health care provider. °· Learn to manage stress. °· Plan rest periods when fatigued. °· Learn strategies to manage high temperatures. If the weather is extremely hot: °¨ Avoid vigorous physical activity. °¨ Use air conditioning or fans or seek a cooler location. °¨ Avoid caffeine and alcohol. °¨ Wear loose-fitting, lightweight, and light-colored clothing. °· Learn strategies to manage cold temperatures. If the weather is extremely cold: °¨ Avoid vigorous physical activity. °¨ Layer clothes. °¨ Wear mittens or gloves, a hat, and a scarf when going outside. °¨ Avoid alcohol. °· Obtain ongoing education and support as needed. °· Participate in or seek rehabilitation as needed to maintain or improve independence and quality of life. °SEEK MEDICAL CARE IF:  °· You have a rapid weight gain. °· You have increasing shortness of breath that is unusual for you. °· You are unable to participate in your usual physical activities. °· You tire  easily. °· You cough more than normal, especially with physical activity. °· You have any or more swelling in areas such as your hands, feet, ankles, or abdomen. °· You are unable to sleep because it is hard to breathe. °· You feel like your heart is beating fast (palpitations). °· You become dizzy or light-headed upon standing up. °SEEK IMMEDIATE MEDICAL CARE IF:  °· You have difficulty breathing. °· There is a change in mental status such as decreased alertness or difficulty with concentration. °· You have a pain or discomfort in your chest. °· You have an episode of fainting (syncope). °MAKE SURE YOU:  °· Understand these instructions. °· Will watch your condition. °· Will get help right away if you are not doing well or get worse. °  °This information is not intended to replace advice given to you by your health care provider. Make sure you discuss any questions you have with your health care provider. °  °Document Released: 09/04/2005 Document Revised: 01/19/2015 Document Reviewed: 10/04/2012 °[employer] Interactive Patient Education ©2016 [employer] ° °Community-Acquired Pneumonia, Adult °Pneumonia is an infection of the lungs. There are different types of pneumonia. One type can develop while a person is in a hospital. A different type, called community-acquired pneumonia, develops in people who are not, or have not   recently been, in the hospital or other health care facility.  °CAUSES °Pneumonia may be caused by bacteria, viruses, or funguses. Community-acquired pneumonia is often caused by Streptococcus pneumonia bacteria. These bacteria are often passed from one person to another by breathing in droplets from the cough or sneeze of an infected person. °RISK FACTORS °The condition is more likely to develop in: °· People who have chronic diseases, such as chronic obstructive pulmonary disease (COPD), asthma, congestive heart failure, cystic fibrosis, diabetes, or kidney disease. °· People who  have early-stage or late-stage HIV. °· People who have sickle cell disease. °· People who have had their spleen removed (splenectomy). °· People who have poor dental hygiene. °· People who have medical conditions that increase the risk of breathing in (aspirating) secretions their own mouth and nose.   °· People who have a weakened immune system (immunocompromised). °· People who smoke. °· People who travel to areas where pneumonia-causing germs commonly exist. °· People who are around animal habitats or animals that have pneumonia-causing germs, including birds, bats, rabbits, cats, and farm animals. °SYMPTOMS °Symptoms of this condition include: °· A dry cough. °· A wet (productive) cough. °· Fever. °· Sweating. °· Chest pain, especially when breathing deeply or coughing. °· Rapid breathing or difficulty breathing. °· Shortness of breath. °· Shaking chills. °· Fatigue. °· Muscle aches. °DIAGNOSIS °Your health care provider will take a medical history and perform a physical exam. You may also have other tests, including: °· Imaging studies of your chest, including X-rays. °· Tests to check your blood oxygen level and other blood gases. °· Other tests on blood, mucus (sputum), fluid around your lungs (pleural fluid), and urine. °If your pneumonia is severe, other tests may be done to identify the specific cause of your illness. °TREATMENT °The type of treatment that you receive depends on many factors, such as the cause of your pneumonia, the medicines you take, and other medical conditions that you have. For most adults, treatment and recovery from pneumonia may occur at home. In some cases, treatment must happen in a hospital. Treatment may include: °· Antibiotic medicines, if the pneumonia was caused by bacteria. °· Antiviral medicines, if the pneumonia was caused by a virus. °· Medicines that are given by mouth or through an IV tube. °· Oxygen. °· Respiratory therapy. °Although rare, treating severe pneumonia  may include: °· Mechanical ventilation. This is done if you are not breathing well on your own and you cannot maintain a safe blood oxygen level. °· Thoracentesis. This procedure removes fluid around one lung or both lungs to help you breathe better. °HOME CARE INSTRUCTIONS °· Take over-the-counter and prescription medicines only as told by your health care provider. °¨ Only take cough medicine if you are losing sleep. Understand that cough medicine can prevent your body's natural ability to remove mucus from your lungs. °¨ If you were prescribed an antibiotic medicine, take it as told by your health care provider. Do not stop taking the antibiotic even if you start to feel better. °· Sleep in a semi-upright position at night. Try sleeping in a reclining chair, or place a few pillows under your head. °· Do not use tobacco products, including cigarettes, chewing tobacco, and e-cigarettes. If you need help quitting, ask your health care provider. °· Drink enough water to keep your urine clear or pale yellow. This will help to thin out mucus secretions in your lungs. °PREVENTION °There are ways that you can decrease your risk of developing community-acquired pneumonia. Consider getting a pneumococcal vaccine if: °· You are older than 48   years of age. °· You are older than 48 years of age and are undergoing cancer treatment, have chronic lung disease, or have other medical conditions that affect your immune system. Ask your health care provider if this applies to you. °There are different types and schedules of pneumococcal vaccines. Ask your health care provider which vaccination option is best for you. °You may also prevent community-acquired pneumonia if you take these actions: °· Get an influenza vaccine every year. Ask your health care provider which type of influenza vaccine is best for you. °· Go to the dentist on a regular basis. °· Wash your hands often. Use hand sanitizer if soap and water are not  available. °SEEK MEDICAL CARE IF: °· You have a fever. °· You are losing sleep because you cannot control your cough with cough medicine. °SEEK IMMEDIATE MEDICAL CARE IF: °· You have worsening shortness of breath. °· You have increased chest pain. °· Your sickness becomes worse, especially if you are an older adult or have a weakened immune system. °· You cough up blood. °  °This information is not intended to replace advice given to you by your health care provider. Make sure you discuss any questions you have with your health care provider. °  °Document Released: 09/04/2005 Document Revised: 05/26/2015 Document Reviewed: 12/30/2014 °[employer] Interactive Patient Education ©2016 [employer] ° ° °

## 2015-10-08 ENCOUNTER — Encounter: Payer: Self-pay | Admitting: Family Medicine

## 2015-10-08 ENCOUNTER — Ambulatory Visit: Payer: No Typology Code available for payment source | Attending: Family Medicine | Admitting: Family Medicine

## 2015-10-08 VITALS — BP 153/83 | HR 107 | Temp 99.3°F | Resp 16 | Ht 73.0 in | Wt 270.0 lb

## 2015-10-08 DIAGNOSIS — J189 Pneumonia, unspecified organism: Secondary | ICD-10-CM

## 2015-10-08 DIAGNOSIS — I5023 Acute on chronic systolic (congestive) heart failure: Secondary | ICD-10-CM | POA: Insufficient documentation

## 2015-10-08 DIAGNOSIS — N182 Chronic kidney disease, stage 2 (mild): Secondary | ICD-10-CM

## 2015-10-08 DIAGNOSIS — Z79899 Other long term (current) drug therapy: Secondary | ICD-10-CM | POA: Insufficient documentation

## 2015-10-08 DIAGNOSIS — N189 Chronic kidney disease, unspecified: Secondary | ICD-10-CM | POA: Insufficient documentation

## 2015-10-08 DIAGNOSIS — I129 Hypertensive chronic kidney disease with stage 1 through stage 4 chronic kidney disease, or unspecified chronic kidney disease: Secondary | ICD-10-CM | POA: Insufficient documentation

## 2015-10-08 DIAGNOSIS — I5022 Chronic systolic (congestive) heart failure: Secondary | ICD-10-CM | POA: Insufficient documentation

## 2015-10-08 DIAGNOSIS — I110 Hypertensive heart disease with heart failure: Secondary | ICD-10-CM | POA: Insufficient documentation

## 2015-10-08 DIAGNOSIS — J9600 Acute respiratory failure, unspecified whether with hypoxia or hypercapnia: Secondary | ICD-10-CM | POA: Insufficient documentation

## 2015-10-08 DIAGNOSIS — R000 Tachycardia, unspecified: Secondary | ICD-10-CM | POA: Insufficient documentation

## 2015-10-08 MED ORDER — FUROSEMIDE 40 MG PO TABS: 40.0000 mg | ORAL_TABLET | Freq: Every day | ORAL | Status: DC

## 2015-10-08 MED ORDER — CARVEDILOL 12.5 MG PO TABS: 12.5000 mg | ORAL_TABLET | Freq: Two times a day (BID) | ORAL | Status: DC

## 2015-10-08 MED ORDER — POTASSIUM CHLORIDE CRYS ER 20 MEQ PO TBCR: 20.0000 meq | EXTENDED_RELEASE_TABLET | Freq: Every day | ORAL | Status: DC

## 2015-10-08 MED FILL — ?FUROSEMIDE 40 MG TABLET: 40 | 30 days supply | Qty: 30 | Fill #0

## 2015-10-08 MED FILL — POTASSIUM CL ER 20 MEQ TAB: 20 | 30 days supply | Qty: 30 | Fill #0

## 2015-10-08 MED FILL — ?CARVEDILOL 12.5 MG TABLET: 12.5 | 30 days supply | Qty: 60 | Fill #0

## 2015-10-08 NOTE — Progress Notes (Signed)
Subjective:  Patient ID: Austin Mccarty, male    DOB: 11-29-1965  Age: 50 y.o. MRN: [id number]  CC: Follow-up and Pulmonary Infiltrate   HPI Austin Mccarty  Is a 50 year old male with a history of hypertension, systolic CHF (EF 78-29%) who was recently hospitalized for acute respiratory failure secondary to multifocal pneumonia from 10/04/15-10/05/15 He had presented to [facility] ED with worsening shortness of breath after being treated with a course of Levaquin previously and discharged home. He was found to have a low-grade fever and was tachycardic, with leukocytosis of 13.4, BNP of 1176.2. CT chest showed bilateral air space opacities most compatible with multifocal pneumonia. He was initially placed on azithromycin and ceftriaxone; influenza test was negative, strep pneumonia urine antigen was negative, respiratory viral panel was negative, blood cultures were no great to date. He was subsequently discharged on Cefuroxime.  Today he states he is still short of breath and has been coughing a lot. He is still taking his antibiotic.  Outpatient Prescriptions Prior to Visit  Medication Sig Dispense Refill  . cefUROXime (CEFTIN) 500 MG tablet Take 1 tablet (500 mg total) by mouth 2 (two) times daily with a meal. 10 tablet 0  . lisinopril (PRINIVIL,ZESTRIL) 40 MG tablet Take 1 tablet (40 mg total) by mouth daily. 30 tablet 11  . amLODipine (NORVASC) 10 MG tablet Take 10 mg by mouth daily.    . carvedilol (COREG) 6.25 MG tablet Take 1 tablet (6.25 mg total) by mouth 2 (two) times daily with a meal. 60 tablet 11  . furosemide (LASIX) 20 MG tablet Take 1 tablet (20 mg total) by mouth daily. 14 tablet 0  . potassium chloride SA (K-DUR,KLOR-CON) 20 MEQ tablet Take 1 tablet (20 mEq total) by mouth daily. 30 tablet 3  . cyclobenzaprine (FLEXERIL) 5 MG tablet Take 1 tablet (5 mg total) by mouth 3 (three) times daily as needed for muscle spasms. (Patient not taking: Reported on 10/08/2015) 30  tablet 0   No facility-administered medications prior to visit.    ROS Review of Systems  Constitutional: Negative for activity change and appetite change.  HENT: Negative for sinus pressure and sore throat.   Eyes: Negative for visual disturbance.  Respiratory: Positive for shortness of breath. Negative for cough and chest tightness.   Cardiovascular: Positive for leg swelling. Negative for chest pain.  Gastrointestinal: Negative for abdominal pain, diarrhea, constipation and abdominal distention.  Endocrine: Negative.   Genitourinary: Negative for dysuria.  Musculoskeletal: Negative for myalgias and joint swelling.  Skin: Negative for rash.  Allergic/Immunologic: Negative.   Neurological: Negative for weakness, light-headedness and numbness.  Psychiatric/Behavioral: Negative for suicidal ideas and dysphoric mood.    Objective:  BP 153/83 mmHg  Pulse 107  Temp(Src) 99.3 F (37.4 C) (Oral)  Resp 16  Ht  (1.854 m)  Wt 270 lb (122.471 kg)  BMI 35.63 kg/m2  SpO2 94%  BP/Weight 10/08/2015 10/05/2015 09/29/2015  Systolic BP 153 146 148  Diastolic BP 83 82 67  Wt. (Lbs) 270 272.3 277.2  BMI 35.63 35.93 32.86    Lab Results  Component Value Date   WBC 11.0* 10/05/2015   HGB 10.5* 10/05/2015   HCT 30.9* 10/05/2015   PLT 362 10/05/2015   GLUCOSE 121* 10/05/2015   CHOL 134 01/16/2008   TRIG 70 01/16/2008   HDL 41 01/16/2008   LDLCALC 79 01/16/2008   ALT 43 10/04/2015   AST 35 10/04/2015   NA 128* 10/05/2015   K 3.5  10/05/2015   CL 89* 10/05/2015   CREATININE 1.48* 10/05/2015   BUN 19 10/05/2015   CO2 30 10/05/2015   TSH 2.065 05/06/2010   INR 1.40 01/08/2015   HGBA1C 6.3* 05/03/2010   Dg Chest 2 View  10/03/2015  CLINICAL DATA:  Shortness of breath and lower extremity edema. Chronic renal failure EXAM: CHEST  2 VIEW COMPARISON:  September 29, 2015 FINDINGS: There is widespread interstitial and alveolar opacity throughout the lungs, stable. There is cardiomegaly  with pulmonary venous hypertension. No adenopathy. No appreciable effusions. No demonstrable bone lesions. IMPRESSION: Widespread interstitial and alveolar opacity bilaterally with cardiomegaly and pulmonary venous hypertension. Differential considerations for this extensive abnormality include congestive heart failure. Widespread pneumonia, Goodpasture's disease with hemorrhage, hypersensitivity pneumonitis, and ARDS. More than 1 of these entities may well exist concurrently. The appearance is stable compared to recent prior study. Electronically Signed   By: Bretta Bang III M.D.   On: 10/03/2015 20:39   Dg Chest 2 View  09/29/2015  CLINICAL DATA:  Cough and fever or 1 week EXAM: CHEST - 2 VIEW COMPARISON:  01/08/2015 FINDINGS: Cardiac shadow is mildly prominent but stable in appearance from the prior exam. There again noted bilateral multifocal infiltrative densities right slightly worse than left. They have is increased slightly in the interval from the prior exam. IMPRESSION: Recurrent bilateral infiltrates right greater than left. Although pulmonary edema deserves consideration these likely represent a multifocal infectious etiology. Electronically Signed   By: Alcide Clever M.D.   On: 09/29/2015 16:24   Ct Chest Wo Contrast  10/04/2015  CLINICAL DATA:  50 year old male with shortness of breath EXAM: CT CHEST WITHOUT CONTRAST TECHNIQUE: Multidetector CT imaging of the chest was performed following the standard protocol without IV contrast. COMPARISON:  Chest radiograph dated 10/03/2015 and CT dated 01/08/2015 FINDINGS: Evaluation of this exam is limited in the absence of intravenous contrast. There is multi focal airspace opacities involving upper and lower lobes bilaterally with areas of air bronchogram most compatible with multifocal pneumonia. Pulmonary edema is not excluded. Clinical correlation is recommended. There is no pleural effusion or pneumothorax. The central airways are patent. The  thoracic aorta and pulmonary arteries are grossly unremarkable on this noncontrast study. There is no cardiomegaly or pericardial effusion. There is mild approximation of the cardiac blood pool suggestive of a degree of anemia. Mildly enlarged paratracheal and mediastinal adenopathy. The esophagus is grossly unremarkable. No thyroid nodule identified. The chest wall soft tissues appear unremarkable. Mild degenerative changes of the spine. Old right seventh rib fracture. No acute fracture. The visualized upper abdomen appears unremarkable. IMPRESSION: Bilateral airspace opacity most compatible with multifocal pneumonia. Underlying edema is not excluded. Clinical correlation and follow-up recommended. Electronically Signed   By: Elgie Collard M.D.   On: 10/04/2015 02:43    Physical Exam  Constitutional: He is oriented to person, place, and time. He appears well-developed and well-nourished.  Neck: JVD present.  Cardiovascular: Normal rate, normal heart sounds and intact distal pulses.   No murmur heard. Pulmonary/Chest: Effort normal and breath sounds normal. He has no wheezes. He has no rales. He exhibits no tenderness.  Abdominal: Soft. Bowel sounds are normal. He exhibits no distension and no mass. There is no tenderness.  Musculoskeletal: Normal range of motion. He exhibits edema (1+ ankle edema which is non pitting in bilateral lower extremities).  Neurological: He is alert and oriented to person, place, and time.     Assessment & Plan:   1. Hypertensive heart disease  with heart failure (HCC)  blood pressure is mildly elevated.  I have raised the dose of his carvedilol from 6.25-12.5 mg twice daily family this will also help with his tachycardia.  2. Systolic CHF, acute on chronic (HCC)  he is currently in acute failure with an elevated BNP from hospitalization and currently elevated JVD.  I am reducing his dose of Lasix from 20 mg to 40 mg daily.   I will also discontinue her  amlodipine due to pedal edema.  comprehensive metabolic panel, BNP,  Office visit - carvedilol (COREG) 12.5 MG tablet; Take 1 tablet (12.5 mg total) by mouth 2 (two) times daily with a meal.  Dispense: 60 tablet; Refill: 3 - furosemide (LASIX) 40 MG tablet; Take 1 tablet (40 mg total) by mouth daily.  Dispense: 30 tablet; Refill: 3 - potassium chloride SA (K-DUR,KLOR-CON) 20 MEQ tablet; Take 1 tablet (20 mEq total) by mouth daily.  Dispense: 30 tablet; Refill: 3  3. CAP (community acquired pneumonia)  he is still short of breath with decreased oxygen saturation of 94%. Advised to continue course of antibiotics and return precautions discussed.  we'll evaluate for resolution of leukocytosis with CBC at next visit.  4.CK D   stage II Likely hypertensive nephropathy. Avoid nephrotoxic agents.  We will need to monitor creatinine especially with recent increase in dose of Lasix.     Meds ordered this encounter  Medications  . carvedilol (COREG) 12.5 MG tablet    Sig: Take 1 tablet (12.5 mg total) by mouth 2 (two) times daily with a meal.    Dispense:  60 tablet    Refill:  3    Discontinue previous dose  . furosemide (LASIX) 40 MG tablet    Sig: Take 1 tablet (40 mg total) by mouth daily.    Dispense:  30 tablet    Refill:  3    Discontinue previous dose  . potassium chloride SA (K-DUR,KLOR-CON) 20 MEQ tablet    Sig: Take 1 tablet (20 mEq total) by mouth daily.    Dispense:  30 tablet    Refill:  3    Follow-up: Return in about 3 weeks (around 10/29/2015) for  follow-up of CHF and pneumonia.   [name] MD

## 2015-10-08 NOTE — Progress Notes (Signed)
Patient's here for Ed f/up for pulmonary infiltrates.   Patient still having coughing spells, and SOB since ED d/c.  Patient denies pain, loss of appetite.  Patient says he has taken his medications today.  Patient declines A1c screening today.

## 2015-10-08 NOTE — Patient Instructions (Signed)

## 2015-10-09 LAB — CULTURE, BLOOD (ROUTINE X 2)
CULTURE: NO GROWTH
Culture: NO GROWTH

## 2015-10-18 ENCOUNTER — Ambulatory Visit: Payer: No Typology Code available for payment source | Attending: Family Medicine | Admitting: Family Medicine

## 2015-10-18 VITALS — BP 154/106 | HR 90 | Temp 98.3°F | Resp 18 | Ht 73.0 in | Wt 263.0 lb

## 2015-10-18 DIAGNOSIS — Z79899 Other long term (current) drug therapy: Secondary | ICD-10-CM | POA: Insufficient documentation

## 2015-10-18 DIAGNOSIS — I5023 Acute on chronic systolic (congestive) heart failure: Secondary | ICD-10-CM | POA: Insufficient documentation

## 2015-10-18 DIAGNOSIS — N182 Chronic kidney disease, stage 2 (mild): Secondary | ICD-10-CM | POA: Insufficient documentation

## 2015-10-18 DIAGNOSIS — R059 Cough, unspecified: Secondary | ICD-10-CM

## 2015-10-18 DIAGNOSIS — R0982 Postnasal drip: Secondary | ICD-10-CM | POA: Insufficient documentation

## 2015-10-18 DIAGNOSIS — I10 Essential (primary) hypertension: Secondary | ICD-10-CM

## 2015-10-18 DIAGNOSIS — R0602 Shortness of breath: Secondary | ICD-10-CM | POA: Insufficient documentation

## 2015-10-18 DIAGNOSIS — R05 Cough: Secondary | ICD-10-CM | POA: Insufficient documentation

## 2015-10-18 DIAGNOSIS — I129 Hypertensive chronic kidney disease with stage 1 through stage 4 chronic kidney disease, or unspecified chronic kidney disease: Secondary | ICD-10-CM | POA: Insufficient documentation

## 2015-10-18 DIAGNOSIS — J029 Acute pharyngitis, unspecified: Secondary | ICD-10-CM

## 2015-10-18 DIAGNOSIS — R609 Edema, unspecified: Secondary | ICD-10-CM | POA: Insufficient documentation

## 2015-10-18 LAB — BASIC METABOLIC PANEL
BUN: 20 mg/dL (ref 7–25)
CALCIUM: 8.2 mg/dL — AB (ref 8.6–10.3)
CO2: 28 mmol/L (ref 20–31)
CREATININE: 1.17 mg/dL (ref 0.60–1.35)
Chloride: 92 mmol/L — ABNORMAL LOW (ref 98–110)
GLUCOSE: 117 mg/dL — AB (ref 65–99)
Potassium: 4.7 mmol/L (ref 3.5–5.3)
Sodium: 131 mmol/L — ABNORMAL LOW (ref 135–146)

## 2015-10-18 LAB — POCT RAPID STREP A (OFFICE): RAPID STREP A SCREEN: NEGATIVE

## 2015-10-18 MED ORDER — FUROSEMIDE 40 MG PO TABS: 40.0000 mg | ORAL_TABLET | Freq: Two times a day (BID) | ORAL | Status: DC

## 2015-10-18 MED ORDER — ISOSORBIDE MONONITRATE ER 30 MG PO TB24
30.0000 mg | ORAL_TABLET | Freq: Every day | ORAL | Status: DC
Start: 2015-10-18 — End: 2015-11-22

## 2015-10-18 MED ORDER — CETIRIZINE HCL 10 MG PO TABS
10.0000 mg | ORAL_TABLET | Freq: Every day | ORAL | Status: DC
Start: 2015-10-18 — End: 2015-11-01

## 2015-10-18 MED ORDER — HYDRALAZINE HCL 25 MG PO TABS: 25.0000 mg | ORAL_TABLET | Freq: Three times a day (TID) | ORAL | Status: DC

## 2015-10-18 MED FILL — hydrALAZINE HCL 25 MG TABS: 25 | 20 days supply | Qty: 60 | Fill #0

## 2015-10-18 MED FILL — ISOSORBIDE MN ER 30 MG TAB: 30 | 30 days supply | Qty: 30 | Fill #0

## 2015-10-18 MED FILL — ?CETIRIZINE HCL 10 MG TABLE: 10 | 30 days supply | Qty: 30 | Fill #0

## 2015-10-18 NOTE — Progress Notes (Signed)
Subjective:  Patient ID: Austin Mccarty, male    DOB: August 17, 1966  Age: 50 y.o. MRN: [id number]  CC: Edema   HPI Austin Mccarty is a 50 year old male with a history of CHF (EF 35-40%), hypertension recently hospitalized for pneumonia and CHF exacerbation with an elevated BNP of 1176.2 and leukocytosis of 13.4 at the time who has completed a course of Cefuroxime but hadstill been short of breath with persisting cough that this last office visit. I had increased the dose of his Lasix from 20 mg daily to 40 mg daily at the time.   He stopped taking his potassium pills because he developed lipedema and swelling of his cheeks which resolved after he stopped it but he continues to use lisinopril and has not had a repeat of the symptoms. Complains of dyspnea on moderate exertion, pedal and lower back edema; he feels more comfortable sleeping in a chair and has a cough that wakes him up at night and his spouse complains the patient speaks in his sleep and moves his extremities excessively.  His blood pressure is elevated despite compliance with antihypertensive.  Outpatient Prescriptions Prior to Visit  Medication Sig Dispense Refill  . carvedilol (COREG) 12.5 MG tablet Take 1 tablet (12.5 mg total) by mouth 2 (two) times daily with a meal. 60 tablet 3  . lisinopril (PRINIVIL,ZESTRIL) 40 MG tablet Take 1 tablet (40 mg total) by mouth daily. 30 tablet 11  . potassium chloride SA (K-DUR,KLOR-CON) 20 MEQ tablet Take 1 tablet (20 mEq total) by mouth daily. 30 tablet 3  . furosemide (LASIX) 40 MG tablet Take 1 tablet (40 mg total) by mouth daily. 30 tablet 3  . cyclobenzaprine (FLEXERIL) 5 MG tablet Take 1 tablet (5 mg total) by mouth 3 (three) times daily as needed for muscle spasms. (Patient not taking: Reported on 10/08/2015) 30 tablet 0  . cefUROXime (CEFTIN) 500 MG tablet Take 1 tablet (500 mg total) by mouth 2 (two) times daily with a meal. (Patient not taking: Reported on 10/18/2015) 10  tablet 0   No facility-administered medications prior to visit.    ROS Review of Systems  Constitutional: Negative for activity change and appetite change.  HENT: Positive for postnasal drip and sore throat. Negative for sinus pressure.   Eyes: Negative for visual disturbance.  Respiratory: Positive for cough and shortness of breath. Negative for chest tightness.   Cardiovascular: Positive for leg swelling. Negative for chest pain.       Orthopnea, paroxysmal nocturnal dyspnea  Gastrointestinal: Negative for abdominal pain, diarrhea, constipation and abdominal distention.  Endocrine: Negative.   Genitourinary: Negative for dysuria.  Musculoskeletal: Negative for myalgias and joint swelling.  Skin: Negative for rash.  Allergic/Immunologic: Negative.   Neurological: Negative for weakness, light-headedness and numbness.  Psychiatric/Behavioral: Negative for suicidal ideas and dysphoric mood.    Objective:  BP 154/106 mmHg  Pulse 90  Temp(Src) 98.3 F (36.8 C) (Oral)  Resp 18  Ht  (1.854 m)  Wt 263 lb (119.296 kg)  BMI 34.71 kg/m2  SpO2 98%  BP/Weight 10/18/2015 10/08/2015 10/05/2015  Systolic BP 154 153 146  Diastolic BP 106 83 82  Wt. (Lbs) 263 270 272.3  BMI 34.71 35.63 35.93    CMP Latest Ref Rng 10/05/2015 10/04/2015 10/03/2015  Glucose 65 - 99 mg/dL 244(W) 102(V) 253(G)  BUN 6 - 20 mg/dL 19 19 64(Q)  Creatinine 0.61 - 1.24 mg/dL 0.34(V) 4.25(Z) 5.63(O)  Sodium 135 - 145 mmol/L 128(L) 130(L) 128(L)  Potassium 3.5 - 5.1 mmol/L 3.5 3.6 3.7  Chloride 101 - 111 mmol/L 89(L) 94(L) 89(L)  CO2 22 - 32 mmol/L Calcium 8.9 - 10.3 mg/dL 8.1(L) 8.1(L) 8.3(L)  Total Protein 6.5 - 8.1 g/dL - 6.0(L) -  Total Bilirubin 0.3 - 1.2 mg/dL - 0.8 -  Alkaline Phos 38 - 126 U/L - 93 -  AST 15 - 41 U/L - 35 -  ALT 17 - 63 U/L - 43 -      Physical Exam  Constitutional: He is oriented to person, place, and time. He appears well-developed and well-nourished.  HENT:  Mild  oropharyngeal erythema  Neck: JVD present.  Cardiovascular: Normal rate, normal heart sounds and intact distal pulses.   No murmur heard. Pulmonary/Chest: Effort normal and breath sounds normal. He has no wheezes. He has no rales. He exhibits no tenderness.  Dyspneic on mild exertion  Abdominal: Soft. Bowel sounds are normal. He exhibits no distension and no mass. There is no tenderness.  Musculoskeletal: Normal range of motion. He exhibits edema (2+ bilateral pedal edema left worse than right).  Neurological: He is alert and oriented to person, place, and time.     Assessment & Plan:   1. Systolic CHF, acute on chronic (HCC) EF of 35-40%, diffuse hypokinesis and 2-D echo from 12/2014 Currently in acute failure; he has diuresed 6 pounds in the last 10 days This could explain his current symptoms of cough, pedal edema and orthopnea and I would optimize his cardiac medications at this time. Lasix increased to 40 mg twice daily. Advised to resume potassium pills and watch out for any allergic reaction; I have expressed to them my concern that he might be allergic to ACE inhibitor even though they insist that he has not had any symptoms while taking it. He notifytheclinicintheeventthathedevelopsanysideeffects. If symptoms persist as his next visit he will be referred to a cardiologist. - Basic Metabolic Panel - Brain natriuretic peptide - hydrALAZINE (APRESOLINE) 25 MG tablet; Take 1 tablet (25 mg total) by mouth 3 (three) times daily.  Dispense: 60 tablet; Refill: 2 - isosorbide mononitrate (IMDUR) 30 MG 24 hr tablet; Take 1 tablet (30 mg total) by mouth daily.  Dispense: 30 tablet; Refill: 2 - furosemide (LASIX) 40 MG tablet; Take 1 tablet (40 mg total) by mouth 2 (two) times daily.  Dispense: 60 tablet; Refill: 3  2. Hypertension, uncontrolled Uncontrolled. Hopefully the addition of the above medications will bring about a control in his ressure  3. Chronic kidney disease, stage 2  (mild) We'll monitor his renal function especially since his Lasix dose has been doubled  4. Sorethroat Postnasal drip could explain his sore throat; we'll place o antihistamines - Culture, Group A Strep - cetirizine (ZYRTEC) 10 MG tablet; Take 1 tablet (10 mg total) by mouth daily.  Dispense: 30 tablet; Refill: 11  5. Cough This could be a symptom of heart failure versus chronic postnasal drip - cetirizine (ZYRTEC) 10 MG tablet; Take 1 tablet (10 mg total) by mouth daily.  Dispense: 30 tablet; Refill: 11 Will consider chest ct at next visit if symptoms persist as his previous CT chest revealed mediastinal lymphadenopathy; will also need to reevaluate for resolving pneumonia   Meds ordered this encounter  Medications  . hydrALAZINE (APRESOLINE) 25 MG tablet    Sig: Take 1 tablet (25 mg total) by mouth 3 (three) times daily.    Dispense:  60 tablet    Refill:  2  . isosorbide  mononitrate (IMDUR) 30 MG 24 hr tablet    Sig: Take 1 tablet (30 mg total) by mouth daily.    Dispense:  30 tablet    Refill:  2  . furosemide (LASIX) 40 MG tablet    Sig: Take 1 tablet (40 mg total) by mouth 2 (two) times daily.    Dispense:  60 tablet    Refill:  3    Discontinue previous dose  . cetirizine (ZYRTEC) 10 MG tablet    Sig: Take 1 tablet (10 mg total) by mouth daily.    Dispense:  30 tablet    Refill:  11    Follow-up: No Follow-up on file.   [name] MD

## 2015-10-18 NOTE — Progress Notes (Signed)
Patient's here for body swelling.   Patient states describes pain as painful, uncomfortable.  Patient states taken BP meds 1.5 hrs prior to OV.  Patient declines diabetes screening

## 2015-10-18 NOTE — Patient Instructions (Signed)

## 2015-10-19 ENCOUNTER — Encounter: Payer: Self-pay | Admitting: Family Medicine

## 2015-10-19 LAB — STREP A DNA PROBE: GASP: NOT DETECTED

## 2015-10-19 LAB — BRAIN NATRIURETIC PEPTIDE: Brain Natriuretic Peptide: 1690.3 pg/mL — ABNORMAL HIGH (ref 0.0–100.0)

## 2015-10-20 ENCOUNTER — Telehealth: Payer: Self-pay

## 2015-10-20 NOTE — Telephone Encounter (Signed)
-----   Message from [name], MD sent at 10/20/2015  2:48 PM EST ----- Blood work is in keeping with acute heart failure, continue current dose of Lasix. Throat culture was negative for strep.

## 2015-10-22 ENCOUNTER — Ambulatory Visit: Payer: No Typology Code available for payment source | Admitting: Family Medicine

## 2015-10-25 ENCOUNTER — Encounter: Payer: Self-pay | Admitting: Family Medicine

## 2015-10-25 ENCOUNTER — Ambulatory Visit: Payer: Medicaid Other | Attending: Family Medicine | Admitting: Family Medicine

## 2015-10-25 VITALS — BP 159/90 | HR 92 | Temp 98.4°F | Resp 16 | Ht 73.0 in | Wt 263.0 lb

## 2015-10-25 DIAGNOSIS — N182 Chronic kidney disease, stage 2 (mild): Secondary | ICD-10-CM | POA: Insufficient documentation

## 2015-10-25 DIAGNOSIS — I5023 Acute on chronic systolic (congestive) heart failure: Secondary | ICD-10-CM | POA: Diagnosis not present

## 2015-10-25 DIAGNOSIS — Z0000 Encounter for general adult medical examination without abnormal findings: Secondary | ICD-10-CM | POA: Diagnosis present

## 2015-10-25 DIAGNOSIS — Z87891 Personal history of nicotine dependence: Secondary | ICD-10-CM | POA: Insufficient documentation

## 2015-10-25 DIAGNOSIS — Z9889 Other specified postprocedural states: Secondary | ICD-10-CM | POA: Insufficient documentation

## 2015-10-25 DIAGNOSIS — R05 Cough: Secondary | ICD-10-CM | POA: Diagnosis not present

## 2015-10-25 DIAGNOSIS — M7989 Other specified soft tissue disorders: Secondary | ICD-10-CM | POA: Diagnosis not present

## 2015-10-25 DIAGNOSIS — I129 Hypertensive chronic kidney disease with stage 1 through stage 4 chronic kidney disease, or unspecified chronic kidney disease: Secondary | ICD-10-CM | POA: Insufficient documentation

## 2015-10-25 DIAGNOSIS — I10 Essential (primary) hypertension: Secondary | ICD-10-CM

## 2015-10-25 DIAGNOSIS — R059 Cough, unspecified: Secondary | ICD-10-CM

## 2015-10-25 DIAGNOSIS — Z79899 Other long term (current) drug therapy: Secondary | ICD-10-CM | POA: Diagnosis not present

## 2015-10-25 MED ORDER — FUROSEMIDE 10 MG/ML IJ SOLN
40.0000 mg | Freq: Once | INTRAMUSCULAR | Status: AC
  Administered 2015-10-25: 40 mg via INTRAMUSCULAR

## 2015-10-25 MED FILL — ?FUROSEMIDE 40 MG TABLET: 40 | 30 days supply | Qty: 60 | Fill #0

## 2015-10-25 NOTE — Progress Notes (Signed)
Subjective:    Patient ID: Austin Mccarty, male    DOB: 1966-04-07, 50 y.o.   MRN: [id number]  HPI He is a 50 year old male with a history of CHF (EF 35-40%), hypertension, who comes into the clinic for a follow-up visit. He had complained of symptoms of CHF exacerbation at his last visit for which furosemide was increased to 40 mg twice daily however he has been taking it only once daily.Hydralazine and isosorbide mononitrate were also added to his regimen. He had complained of a sore throat and postnasal drip and throat cultures came back negative for strep; he was placed on an antihistamine for postnasal drip.  Today he complains of worsening cough and inability to sleep due to persisting cough; he has also noticed worsening swelling in his ankles His blood pressure is elevated today and he endorses taking only some of his antihypertensives.  Past Medical History  Diagnosis Date  . CKD (chronic kidney disease) stage 2, GFR 60-89 ml/min   . CAP (community acquired pneumonia) 01/08/2015  . Foreign body, eye 2009  . Hypertension     hx/pt 01/08/2015    Past Surgical History  Procedure Laterality Date  . Eye surgery Right 2009    "removed glass"    Social History   Social History  . Marital Status: Married    Spouse Name: N/A  . Number of Children: N/A  . Years of Education: N/A   Occupational History  . Not on file.   Social History Main Topics  . Smoking status: Former Smoker -- 1.00 packs/day for 8 years    Types: Cigarettes    Quit date: 10/25/2007  . Smokeless tobacco: Never Used     Comment: "quit smoking in ~ 2009  . Alcohol Use: No  . Drug Use: No  . Sexual Activity: Yes   Other Topics Concern  . Not on file   Social History Narrative    Allergies  Allergen Reactions  . Ibuprofen Itching and Swelling    Current Outpatient Prescriptions on File Prior to Visit  Medication Sig Dispense Refill  . carvedilol (COREG) 12.5 MG tablet Take 1 tablet (12.5  mg total) by mouth 2 (two) times daily with a meal. 60 tablet 3  . cetirizine (ZYRTEC) 10 MG tablet Take 1 tablet (10 mg total) by mouth daily. 30 tablet 11  . furosemide (LASIX) 40 MG tablet Take 1 tablet (40 mg total) by mouth 2 (two) times daily. 60 tablet 3  . isosorbide mononitrate (IMDUR) 30 MG 24 hr tablet Take 1 tablet (30 mg total) by mouth daily. 30 tablet 2  . lisinopril (PRINIVIL,ZESTRIL) 40 MG tablet Take 1 tablet (40 mg total) by mouth daily. 30 tablet 11  . potassium chloride SA (K-DUR,KLOR-CON) 20 MEQ tablet Take 1 tablet (20 mEq total) by mouth daily. 30 tablet 3  . cyclobenzaprine (FLEXERIL) 5 MG tablet Take 1 tablet (5 mg total) by mouth 3 (three) times daily as needed for muscle spasms. (Patient not taking: Reported on 10/08/2015) 30 tablet 0  . hydrALAZINE (APRESOLINE) 25 MG tablet Take 1 tablet (25 mg total) by mouth 3 (three) times daily. (Patient not taking: Reported on 10/25/2015) 60 tablet 2   No current facility-administered medications on file prior to visit.      Review of Systems Review of Systems  Constitutional: Negative for activity change and appetite change.  HENT: Positive for postnasal drip. Negative for sinus pressure.   Eyes: Negative for visual disturbance.  Respiratory: Positive  for cough and shortness of breath. Negative for chest tightness.   Cardiovascular: Positive for leg swelling. Negative for chest pain.       Orthopnea, paroxysmal nocturnal dyspnea  Gastrointestinal: Negative for abdominal pain, diarrhea, constipation and abdominal distention.  Endocrine: Negative.   Genitourinary: Negative for dysuria.  Musculoskeletal: Negative for myalgias and joint swelling.  Skin: Negative for rash.  Allergic/Immunologic: Negative.   Neurological: Negative for weakness, light-headedness and numbness.  Psychiatric/Behavioral: Negative for suicidal ideas and dysphoric mood.      Objective: Filed Vitals:   10/25/15 1145 10/25/15 1205  BP: 159/90     Pulse: 92   Temp: 98.4 F (36.9 C)   Resp: 16   Height:  (1.854 m)   Weight: 263 lb (119.296 kg)   SpO2: 90% 88%      Physical Exam Constitutional: He is oriented to person, place, and time. He appears well-developed and well-nourished.  HENT:  Mild oropharyngeal erythema  Neck: JVD present.  Cardiovascular: Normal rate, normal heart sounds and intact distal pulses.   No murmur heard. Pulmonary/Chest: Effort normal and breath sounds normal. He has no wheezes. He has no rales. He exhibits no tenderness.  Dyspneic at rest Abdominal: Soft. Bowel sounds are normal. He exhibits no distension and no mass. There is no tenderness.  Musculoskeletal: Normal range of motion. He exhibits edema (3+ bilateral pedal edema left worse than right).  Neurological: He is alert and oriented to person, place, and time.    Wt Readings from Last 3 Encounters:  10/25/15 263 lb (119.296 kg)  10/18/15 263 lb (119.296 kg)  10/08/15 270 lb (122.471 kg)       Assessment & Plan:  1. Systolic CHF, acute on chronic (HCC) EF of 35-40%, diffuse hypokinesis and 2-D echo from 12/2014 Currently in acute failure due to his failure to increase his dose of Lasix as instructed at his last visit BNP was 1698.3 a week ago; he is also hypoxic today- 90% and had to receive 2L oxygen in the clinic His weight is stable compared to his last visit 1 week ago. IV Lasix 40 mg given in the clinic, repeat BP after Lasix Continue increased dose of Lasix and if symptoms persist I will increase to 80 mg twice daily at his next visit in 2 days - hydrALAZINE (APRESOLINE) 25 MG tablet; Take 1 tablet (25 mg total) by mouth 3 (three) times daily.  Dispense: 60 tablet; Refill: 2 - isosorbide mononitrate (IMDUR) 30 MG 24 hr tablet; Take 1 tablet (30 mg total) by mouth daily.  Dispense: 30 tablet; Refill: 2 - furosemide (LASIX) 40 MG tablet; Take 1 tablet (40 mg total) by mouth 2 (two) times daily.  Dispense: 60 tablet; Refill:  3  2. Hypertension, uncontrolled Uncontrolled. He is yet to take some of his antihypertensives and I have advised him to always take his antihypertensives in the morning.  3. Chronic kidney disease, stage 2 (mild) We'll monitor his renal function especially since his Lasix dose has been doubled Last creatinine was 1.17  4. Cough This could be a symptom of heart failure versus chronic postnasal drip - cetirizine (ZYRTEC) 10 MG tablet; Take 1 tablet (10 mg total) by mouth daily.  Dispense: 30 tablet; Refill: 11 Will consider chest ct in the near future as previous CT chest revealed mediastinal lymphadenopathy; will also need to reevaluate for resolving pneumonia

## 2015-10-25 NOTE — Progress Notes (Signed)
Patient complains of SOB and cough O2 sats 90% in office Reports lower leg edema and has been taking his furosemide although her reports not taking meds this am and that he is not taking hydralazine

## 2015-10-25 NOTE — Patient Instructions (Signed)

## 2015-10-26 NOTE — Telephone Encounter (Signed)
CMA called patient, patient verified name and DOB. Patient was given lab results and verbalized that he understood with no further questions.

## 2015-10-27 ENCOUNTER — Inpatient Hospital Stay (HOSPITAL_COMMUNITY)
Admission: EM | Admit: 2015-10-27 | Discharge: 2015-11-01 | DRG: 291 | Disposition: A | Discharge status: Home / Self Care | Payer: Medicaid Other | Attending: Internal Medicine | Admitting: Internal Medicine

## 2015-10-27 ENCOUNTER — Encounter (HOSPITAL_COMMUNITY): Payer: Self-pay | Admitting: *Deleted

## 2015-10-27 ENCOUNTER — Ambulatory Visit: Payer: Self-pay | Attending: Family Medicine

## 2015-10-27 ENCOUNTER — Emergency Department (HOSPITAL_COMMUNITY): Payer: Medicaid Other

## 2015-10-27 ENCOUNTER — Encounter: Payer: Self-pay | Admitting: Family Medicine

## 2015-10-27 ENCOUNTER — Observation Stay (HOSPITAL_COMMUNITY): Payer: Medicaid Other

## 2015-10-27 ENCOUNTER — Ambulatory Visit (HOSPITAL_BASED_OUTPATIENT_CLINIC_OR_DEPARTMENT_OTHER): Payer: Self-pay | Admitting: Family Medicine

## 2015-10-27 VITALS — BP 161/83 | HR 100 | Temp 97.7°F | Resp 15 | Ht 73.0 in | Wt 261.8 lb

## 2015-10-27 DIAGNOSIS — I5042 Chronic combined systolic (congestive) and diastolic (congestive) heart failure: Secondary | ICD-10-CM

## 2015-10-27 DIAGNOSIS — N182 Chronic kidney disease, stage 2 (mild): Secondary | ICD-10-CM

## 2015-10-27 DIAGNOSIS — R05 Cough: Secondary | ICD-10-CM | POA: Insufficient documentation

## 2015-10-27 DIAGNOSIS — Z8249 Family history of ischemic heart disease and other diseases of the circulatory system: Secondary | ICD-10-CM

## 2015-10-27 DIAGNOSIS — I129 Hypertensive chronic kidney disease with stage 1 through stage 4 chronic kidney disease, or unspecified chronic kidney disease: Secondary | ICD-10-CM | POA: Insufficient documentation

## 2015-10-27 DIAGNOSIS — R0609 Other forms of dyspnea: Secondary | ICD-10-CM

## 2015-10-27 DIAGNOSIS — E669 Obesity, unspecified: Secondary | ICD-10-CM | POA: Diagnosis present

## 2015-10-27 DIAGNOSIS — I110 Hypertensive heart disease with heart failure: Secondary | ICD-10-CM

## 2015-10-27 DIAGNOSIS — J189 Pneumonia, unspecified organism: Secondary | ICD-10-CM | POA: Diagnosis present

## 2015-10-27 DIAGNOSIS — D649 Anemia, unspecified: Secondary | ICD-10-CM | POA: Diagnosis present

## 2015-10-27 DIAGNOSIS — R059 Cough, unspecified: Secondary | ICD-10-CM

## 2015-10-27 DIAGNOSIS — Z87891 Personal history of nicotine dependence: Secondary | ICD-10-CM | POA: Insufficient documentation

## 2015-10-27 DIAGNOSIS — I130 Hypertensive heart and chronic kidney disease with heart failure and stage 1 through stage 4 chronic kidney disease, or unspecified chronic kidney disease: Principal | ICD-10-CM | POA: Diagnosis present

## 2015-10-27 DIAGNOSIS — N183 Chronic kidney disease, stage 3 (moderate): Secondary | ICD-10-CM | POA: Diagnosis present

## 2015-10-27 DIAGNOSIS — J9601 Acute respiratory failure with hypoxia: Secondary | ICD-10-CM | POA: Diagnosis present

## 2015-10-27 DIAGNOSIS — E871 Hypo-osmolality and hyponatremia: Secondary | ICD-10-CM | POA: Diagnosis present

## 2015-10-27 DIAGNOSIS — R739 Hyperglycemia, unspecified: Secondary | ICD-10-CM | POA: Diagnosis present

## 2015-10-27 DIAGNOSIS — I5023 Acute on chronic systolic (congestive) heart failure: Secondary | ICD-10-CM | POA: Diagnosis present

## 2015-10-27 DIAGNOSIS — Z79899 Other long term (current) drug therapy: Secondary | ICD-10-CM

## 2015-10-27 DIAGNOSIS — R609 Edema, unspecified: Secondary | ICD-10-CM | POA: Insufficient documentation

## 2015-10-27 DIAGNOSIS — I5021 Acute systolic (congestive) heart failure: Secondary | ICD-10-CM | POA: Diagnosis present

## 2015-10-27 DIAGNOSIS — R0602 Shortness of breath: Secondary | ICD-10-CM | POA: Insufficient documentation

## 2015-10-27 DIAGNOSIS — D509 Iron deficiency anemia, unspecified: Secondary | ICD-10-CM | POA: Diagnosis present

## 2015-10-27 DIAGNOSIS — Z8701 Personal history of pneumonia (recurrent): Secondary | ICD-10-CM

## 2015-10-27 DIAGNOSIS — Z6834 Body mass index (BMI) 34.0-34.9, adult: Secondary | ICD-10-CM

## 2015-10-27 DIAGNOSIS — Y95 Nosocomial condition: Secondary | ICD-10-CM | POA: Diagnosis present

## 2015-10-27 DIAGNOSIS — E876 Hypokalemia: Secondary | ICD-10-CM | POA: Diagnosis present

## 2015-10-27 DIAGNOSIS — I10 Essential (primary) hypertension: Secondary | ICD-10-CM | POA: Diagnosis present

## 2015-10-27 DIAGNOSIS — T502X5A Adverse effect of carbonic-anhydrase inhibitors, benzothiadiazides and other diuretics, initial encounter: Secondary | ICD-10-CM | POA: Diagnosis present

## 2015-10-27 HISTORY — DX: Chronic systolic (congestive) heart failure: I50.22

## 2015-10-27 LAB — BRAIN NATRIURETIC PEPTIDE: B Natriuretic Peptide: 582.5 pg/mL — ABNORMAL HIGH (ref 0.0–100.0)

## 2015-10-27 LAB — COMPREHENSIVE METABOLIC PANEL
ALK PHOS: 65 U/L (ref 38–126)
ALT: 23 U/L (ref 17–63)
AST: 19 U/L (ref 15–41)
Albumin: 2 g/dL — ABNORMAL LOW (ref 3.5–5.0)
Anion gap: 11 (ref 5–15)
BILIRUBIN TOTAL: 0.6 mg/dL (ref 0.3–1.2)
BUN: 16 mg/dL (ref 6–20)
CALCIUM: 8.4 mg/dL — AB (ref 8.9–10.3)
CO2: 26 mmol/L (ref 22–32)
CREATININE: 1.38 mg/dL — AB (ref 0.61–1.24)
Chloride: 96 mmol/L — ABNORMAL LOW (ref 101–111)
GFR calc Af Amer: >60 mL/min (ref >60)
GFR, EST NON AFRICAN AMERICAN: 60 mL/min — AB (ref >60)
GLUCOSE: 99 mg/dL (ref 65–99)
Potassium: 3.6 mmol/L (ref 3.5–5.1)
Sodium: 133 mmol/L — ABNORMAL LOW (ref 135–145)
TOTAL PROTEIN: 7.2 g/dL (ref 6.5–8.1)

## 2015-10-27 LAB — CBC
HEMATOCRIT: 31 % — AB (ref 39.0–52.0)
Hemoglobin: 9.8 g/dL — ABNORMAL LOW (ref 13.0–17.0)
MCH: 21.8 pg — AB (ref 26.0–34.0)
MCHC: 31.6 g/dL (ref 30.0–36.0)
MCV: 68.9 fL — ABNORMAL LOW (ref 78.0–100.0)
Platelets: 308 10*3/uL (ref 150–400)
RBC: 4.5 MIL/uL (ref 4.22–5.81)
RDW: 15.9 % — AB (ref 11.5–15.5)
WBC: 8.5 10*3/uL (ref 4.0–10.5)

## 2015-10-27 LAB — BASIC METABOLIC PANEL
Anion gap: 11 (ref 5–15)
BUN: 17 mg/dL (ref 6–20)
CO2: 26 mmol/L (ref 22–32)
Calcium: 8.3 mg/dL — ABNORMAL LOW (ref 8.9–10.3)
Chloride: 95 mmol/L — ABNORMAL LOW (ref 101–111)
Creatinine, Ser: 1.46 mg/dL — ABNORMAL HIGH (ref 0.61–1.24)
GFR calc Af Amer: >60 mL/min (ref >60)
GFR, EST NON AFRICAN AMERICAN: 56 mL/min — AB (ref >60)
GLUCOSE: 129 mg/dL — AB (ref 65–99)
POTASSIUM: 3.9 mmol/L (ref 3.5–5.1)
Sodium: 132 mmol/L — ABNORMAL LOW (ref 135–145)

## 2015-10-27 LAB — I-STAT TROPONIN, ED: Troponin i, poc: 0 ng/mL (ref 0.00–0.08)

## 2015-10-27 NOTE — Progress Notes (Signed)
Patient reports no improvement He is coughing today He has taken his medication

## 2015-10-27 NOTE — H&P (Signed)
[facility] History and Physical  Fordyce Lepak ZOX:[id number] DOB: July 26, 1966 DOA: 10/27/2015  Referring physician: Dierdre Forth, PA-C PCP: [name], MD   Chief Complaint: Shortness of breath and leg swelling.  HPI: Austin Mccarty is a 50 y.o. male with a past medical history of chronic systolic CHF, hypertension, stage II CKD, recently admitted and discharged for community-acquired pneumonia comes to the emergency department due to progressively worse dyspnea and lower extremity edema for about a week.  Per patient, he was admitted and discharge for community-acquired pneumonia last month. He states that he was doing well post discharge, but for the past 2 weeks started noticing increased lower extremity edema. Since about a week ago, he started noticing progressively worse dyspnea and had his furosemide dose 4 days ago. He denies chest pain, palpitations, orthopnea or PND, but complains of occasional positional dizziness. He also complains of persistent episodes of coughing, wheezing, subjective fevers, chills, fatigue, malaise and loss of appetite.   When seen, the patient stated he was feeling better with supplemental oxygen. He was in no acute distress.   Review of Systems:  Constitutional:  Positive Fevers, chills, fatigue.  No weight loss, night sweats. HEENT:  No headaches, Difficulty swallowing,Tooth/dental problems,Sore throat,  No sneezing, itching, ear ache, nasal congestion, post nasal drip,  Cardio-vascular:  swelling in lower extremities, anasarca, dizziness. No chest pain,   Palpitations, Orthopnea, PND, GI:   loss of appetite, had diarrhea 2 weeks ago, which has resulted since then. No heartburn, indigestion, abdominal pain, nausea, vomiting, change in bowel habits, Resp:  Positive dyspnea, occasionally productive cough, positive wheezing. No hemoptysis. Skin:  no rash or lesions.  GU:  no dysuria, change in color of urine, no urgency  or frequency. No flank pain.  Musculoskeletal:  No joint pain or swelling. No decreased range of motion. No back pain.  Psych:  No change in mood or affect. No depression or anxiety. No memory loss.   Past Medical History  Diagnosis Date  . CKD (chronic kidney disease) stage 2, GFR 60-89 ml/min   . CAP (community acquired pneumonia) 01/08/2015  . Foreign body, eye 2009  . Hypertension     hx/pt 01/08/2015   Past Surgical History  Procedure Laterality Date  . Eye surgery Right 2009    "removed glass"   Social History:  reports that he quit smoking about 8 years ago. His smoking use included Cigarettes. He has a 8 pack-year smoking history. He has never used smokeless tobacco. He reports that he does not drink alcohol or use illicit drugs. Drinks tea twice a day.   Allergies  Allergen Reactions  . Ibuprofen Itching and Swelling    Family History  Problem Relation Age of Onset  . Hypertension Father     Prior to Admission medications   Medication Sig Start Date End Date Taking? Authorizing Provider  carvedilol (COREG) 12.5 MG tablet Take 1 tablet (12.5 mg total) by mouth 2 (two) times daily with a meal. 10/08/15  Yes [name], MD  furosemide (LASIX) 40 MG tablet Take 1 tablet (40 mg total) by mouth 2 (two) times daily. 10/18/15  Yes [name], MD  hydrALAZINE (APRESOLINE) 25 MG tablet Take 1 tablet (25 mg total) by mouth 3 (three) times daily. 10/18/15  Yes [name], MD  isosorbide mononitrate (IMDUR) 30 MG 24 hr tablet Take 1 tablet (30 mg total) by mouth daily. 10/18/15  Yes [name], MD  lisinopril (PRINIVIL,ZESTRIL) 40 MG tablet Take 1  tablet (40 mg total) by mouth daily. 02/10/15  Yes Gaylord Shih, MD  potassium chloride SA (K-DUR,KLOR-CON) 20 MEQ tablet Take 1 tablet (20 mEq total) by mouth daily. 10/08/15  Yes [name], MD  cetirizine (ZYRTEC) 10 MG tablet Take 1 tablet (10 mg total) by mouth daily. Patient not taking: Reported on 10/27/2015 10/18/15   [name], MD  cyclobenzaprine (FLEXERIL) 5 MG tablet Take 1 tablet (5 mg total) by mouth 3 (three) times daily as needed for muscle spasms. Patient not taking: Reported on 10/27/2015 08/09/15   Linna Hoff, MD   Physical Exam: Filed Vitals:   10/27/15 2215 10/27/15 2245 10/27/15 2314 10/27/15 2324  BP: 125/96 156/97  172/89  Pulse: 96 100  94  Temp:    98.1 F (36.7 C)  TempSrc:    Oral  Resp: 32 25  22  Height:    (1.854 m)   Weight:   117.845 kg (259 lb 12.8 oz)   SpO2: 91% 96%  97%    Wt Readings from Last 3 Encounters:  10/27/15 117.845 kg (259 lb 12.8 oz)  10/27/15 118.752 kg (261 lb 12.8 oz)  10/25/15 119.296 kg (263 lb)    General:  Appears calm and comfortable Eyes: PERRL, normal lids, irises & conjunctiva ENT: Nasal cannula in place, grossly normal hearing, lips & tongue Neck: no LAD, masses or thyromegaly Cardiovascular: RRR, no m/r/g. 2+ LE edema. Telemetry: SR, no arrhythmias  Respiratory: Decreased breath sounds on rales on both bases, subtle rhonchi, no wheezing.                      No accessory muscle use. Abdomen: BS+, soft, ntnd Skin: no rash or induration seen on limited exam Musculoskeletal: grossly normal tone BUE/BLE Psychiatric: grossly normal mood and affect, speech fluent and appropriate Neurologic: Awake, alert, oriented 3, grossly non-focal.          Labs on Admission:  Basic Metabolic Panel:  Recent Labs Lab 10/27/15 1742 10/27/15 2212  NA 132* 133*  K 3.9 3.6  CL 95* 96*  CO2 26 26  GLUCOSE 129* 99  BUN 17 16  CREATININE 1.46* 1.38*  CALCIUM 8.3* 8.4*   Liver Function Tests:  Recent Labs Lab 10/27/15 2212  AST 19  ALT 23  ALKPHOS 65  BILITOT 0.6  PROT 7.2  ALBUMIN 2.0*   CBC:  Recent Labs Lab 10/27/15 1742  WBC 8.5  HGB 9.8*  HCT 31.0*  MCV 68.9*  PLT 308     BNP (last 3 results)  Recent Labs  09/29/15 2013 10/03/15 2212 10/27/15 2027  BNP 993.4* 1176.2* 582.5*    ProBNP (last 3  results)  Recent Labs  01/12/15 1213  PROBNP 2840.00*    Radiological Exams on Admission: Dg Chest 2 View  10/27/2015  CLINICAL DATA:  Bilateral pedal edema, cough, shortness of breath EXAM: CHEST  2 VIEW COMPARISON:  CT chest 10/04/2015 FINDINGS: There is bilateral interstitial and alveolar airspace opacities. There is no pleural effusion or pneumothorax. There is stable cardiomegaly. There is no acute osseous abnormality. IMPRESSION: Findings concerning for pulmonary edema versus multilobar pneumonia. Electronically Signed   By: Elige Ko   On: 10/27/2015 18:28   echocardiogram 01/09/2015    ------------------------------------------------------------------- LV EF: 35% -  40%  ------------------------------------------------------------------- Indications:   Dyspnea 786.09.  ------------------------------------------------------------------- History:  Risk factors: Former tobacco use. Hypertension.  ------------------------------------------------------------------- Study Conclusions  - Left ventricle: The cavity size was mildly  dilated. Wall thickness was increased in a pattern of moderate LVH. Systolic function was moderately reduced. The estimated ejection fraction was in the range of 35% to 40%. Diffuse hypokinesis. The study is not technically sufficient to allow evaluation of LV diastolic function. - Mitral valve: There was moderate regurgitation. - Left atrium: The atrium was mildly dilated. - Right atrium: The atrium was mildly dilated. - Tricuspid valve: There was moderate regurgitation. - Pulmonary arteries: PA peak pressure: 59 mm Hg (S).  Impressions:  - Global L. Strain=-12.9.  EKG: Independently reviewed. Vent. rate 94 BPM PR interval 142 ms QRS duration 102 ms QT/QTc 352/440 ms P-R-T axes 42 11 150 Normal sinus rhythm Left ventricular hypertrophy with repolarization abnormality Abnormal ECG  Assessment/Plan Principal Problem:    Systolic CHF, acute (HCC) Admit to telemetry. Furosemide IVP. Fluid Restriction. Continue lisinopril, carvedilol, hydralazine and isosorbide mononitrate. Monitor BUN/creatinine and electrolytes Monitor daily weights. Monitor intake and output.  Active Problems:   HCAP. Imaging is better than previous, however the patient has been having fever and persistent symptoms. Check blood cultures and sensitivity. Check sputum culture. We will start IV antibiotics    Essential hypertension Continue carvedilol 12.5 mg by mouth twice a day Continue hydralazine 25 mg by mouth 3 times a day Continue lisinopril 40 mg by mouth daily. Monitor blood pressure.    Hyponatremia Likely due to furosemide and hemodilution from water intake. Fluid restriction Follow sodium level in the morning.    Hypokalemia Replacing. Follow potassium level.    Anemia Monitor  hematocrit and hemoglobin periodically.   Code Status: Full code. DVT Prophylaxis: Lovenox SQ. Family Communication:  Disposition Plan: Admit to telemetry for diuresis with IV furosemide and IV antibiotic therapy.  Time spent: Over 70 minutes were spent in the process of this admission.  Bobette Mo, M.D. [facility] Pager [phone]

## 2015-10-27 NOTE — Patient Instructions (Signed)

## 2015-10-27 NOTE — ED Notes (Signed)
Pt Sp02 while ambulating 99%; upon sitting back down on the bed, Sp02 dropped to a 92% and picked back up to a 94% within 1 min of resting.

## 2015-10-27 NOTE — ED Notes (Signed)
Patient ambulating with 02 sat of 99%.

## 2015-10-27 NOTE — Progress Notes (Signed)
Subjective:    Patient ID: Austin Mccarty, male    DOB: [birth year], 50 y.o.   MRN: [id number]  HPI He is a 50 year old male with a history of CHF (EF 35-40% from 2-D echo of 12/2014), hypertension, whom I had seen 2 days ago for acute CHF exacerbation who was hypoxic at the time with O2 sats in the 90s and needing 2 L OXYGEN in the clinic even though he is not usually on oxygen and he received IV Lasix as well.   He had significant bilateral pedal edema and had complained of cough, shortness of breath at rest, orthopnea and paroxysmal nocturnal dyspnea but interestingly his weight was stable at 263 lbs compared to 2 weeks prior. He had an increase in the dose of his Lasix to 40 mg twice daily, hydralazine and Imdur were added to his regimen and was advised to return today for reassessment.  He appears to be worse than when I saw him 2 days ago and is still dyspneic at rest with an oxygen saturation of 88% on room air and his pedal edema has worsened. He is unable to sleep for fear of choking and sits up in a chair; endorses compliance with medications. Also complains his mouth feels like "it has got fire inside of it" He has lost 2 pounds in the last 2 days but he does seem to be more edematous than when I saw him on Monday; he also has a reduced appetite and denies fever, GI symptoms, chest pain.  Past Medical History  Diagnosis Date  . CKD (chronic kidney disease) stage 2, GFR 60-89 ml/min   . CAP (community acquired pneumonia) 01/08/2015  . Foreign body, eye 2009  . Hypertension     hx/pt 01/08/2015    Past Surgical History  Procedure Laterality Date  . Eye surgery Right 2009    "removed glass"    Social History   Social History  . Marital Status: Married    Spouse Name: N/A  . Number of Children: N/A  . Years of Education: N/A   Occupational History  . Not on file.   Social History Main Topics  . Smoking status: Former Smoker -- 1.00 packs/day for 8 years    Types:  Cigarettes    Quit date: 10/25/2007  . Smokeless tobacco: Never Used     Comment: "quit smoking in ~ 2009  . Alcohol Use: No  . Drug Use: No  . Sexual Activity: Yes   Other Topics Concern  . Not on file   Social History Narrative    Allergies  Allergen Reactions  . Ibuprofen Itching and Swelling      Review of Systems Review of Systems  Constitutional: Negative for activity change and appetite change.  HENT: Positive for postnasal drip. Negative for sinus pressure.   Eyes: Negative for visual disturbance.  Respiratory: Positive for cough and shortness of breath. Negative for chest tightness.   Cardiovascular: Positive for leg swelling. Negative for chest pain.       Orthopnea, paroxysmal nocturnal dyspnea  Gastrointestinal: Negative for abdominal pain, diarrhea, constipation and abdominal distention.  Endocrine: Negative.   Genitourinary: Negative for dysuria.  Musculoskeletal: Negative for myalgias and joint swelling.  Skin: Negative for rash.  Allergic/Immunologic: Negative.   Neurological: Negative for weakness, light-headedness and numbness.  Psychiatric/Behavioral: Negative for suicidal ideas and dysphoric mood.      Objective: Filed Vitals:   10/27/15 1201  BP: 161/83  Pulse: 100  Temp: 97.7 F (  36.5 C)  Resp: 15  Height:  (1.854 m)  Weight: 261 lb 12.8 oz (118.752 kg)  SpO2: 88%      Physical Exam Constitutional: He is oriented to person, place, and time. He appears well-developed and well-nourished.  HENT:  Mild oropharyngeal erythema  Neck: JVD present.  Cardiovascular: Normal rate, normal heart sounds and intact distal pulses.   No murmur heard. Pulmonary/Chest: Effort normal and breath sounds normal. He has no wheezes. He has no rales. He exhibits no tenderness.  Dyspneic at rest Abdominal: Soft. Bowel sounds are normal. He exhibits no distension and no mass. There is no tenderness.  Musculoskeletal: Normal range of motion. He exhibits  edema (4+ bilateral pedal edema left worse than right).  Neurological: He is alert and oriented to person, place, and time.   Wt Readings from Last 3 Encounters:  10/27/15 261 lb 12.8 oz (118.752 kg)  10/25/15 263 lb (119.296 kg)  10/18/15 263 lb (119.296 kg)   CMP Latest Ref Rng 10/18/2015 10/05/2015 10/04/2015  Glucose 65 - 99 mg/dL 161(W) 960(A) 540(J)  BUN 7 - 25 mg/dL Creatinine 0.60 - 1.35 mg/dL 8.11 9.14(N) 8.29(F)  Sodium 135 - 146 mmol/L 131(L) 128(L) 130(L)  Potassium 3.5 - 5.3 mmol/L 4.7 3.5 3.6  Chloride 98 - 110 mmol/L 92(L) 89(L) 94(L)  CO2 20 - 31 mmol/L Calcium 8.6 - 10.3 mg/dL 8.2(L) 8.1(L) 8.1(L)  Total Protein 6.5 - 8.1 g/dL - - 6.0(L)  Total Bilirubin 0.3 - 1.2 mg/dL - - 0.8  Alkaline Phos 38 - 126 U/L - - 93  AST 15 - 41 U/L - - 35  ALT 17 - 63 U/L - - 43     BNP (last 3 results)  Recent Labs  01/07/15 2010 09/29/15 2013 10/03/15 2212  BNP 551.8* 993.4* 1176.2*          Assessment & Plan:  1. Systolic CHF, acute on chronic (HCC) EF of 35-40%, diffuse hypokinesis and 2-D echo from 12/2014 Currently in acute failure and is dyspneic at rest (O2 sat of 88-92%) ; has lost 2 pounds in 2 days but seems to be more edematous than 2 days ago He received IV Lasix in the clinic 2 days ago and Lasix dose was increased with no improvement in symptoms. BNP was 1698.3 a week ago Referred to the ED for hospitalization given failed outpatient management of acute CHF exacerbation. - hydrALAZINE (APRESOLINE) 25 MG tablet; Take 1 tablet (25 mg total) by mouth 3 (three) times daily.  Dispense: 60 tablet; Refill: 2 - isosorbide mononitrate (IMDUR) 30 MG 24 hr tablet; Take 1 tablet (30 mg total) by mouth daily.  Dispense: 30 tablet; Refill: 2 - furosemide (LASIX) 40 MG tablet; Take 1 tablet (40 mg total) by mouth 2 (two) times daily.  Dispense: 60 tablet; Refill: 3  2. Hypertension, uncontrolled Uncontrolled. Will need upward titration of his  antihypertensive Low-sodium diet  3. Chronic kidney disease, stage 2 (mild) We'll monitor his renal function especially since his Lasix dose has been doubled Last creatinine was 1.17  4. Cough This could be a symptom of heart failure versus chronic postnasal drip - cetirizine (ZYRTEC) 10 MG tablet; Take 1 tablet (10 mg total) by mouth daily.  Dispense: 30 tablet; Refill: 11 Will consider chest ct in the near future as previous CT chest revealed mediastinal lymphadenopathy; will also need to reevaluate for resolving pneumonia

## 2015-10-27 NOTE — ED Provider Notes (Signed)
CSN: [id number]     Arrival date & time 10/27/15  1705 History   First MD Initiated Contact with Patient 10/27/15 2025     Chief Complaint  Patient presents with  . Shortness of Breath  . Leg Swelling     (Consider location/radiation/quality/duration/timing/severity/associated sxs/prior Treatment) The history is provided by the patient and medical records. No language interpreter was used.     Austin Mccarty is a 50 y.o. male  with a hx of chronic kidney disease, pneumonia, hypertension presents to the Emergency Department complaining of gradual, persistent, progressively worsening shortness of breath and bilateral lower leg swelling onset 1-2 weeks ago. Patient reports he has been taking Lasix for approximately 1-2 months and 4 days ago his dose was doubled. He reports persistent coughing or he feels as if he can't breathe. He states that his leg swelling is getting worse. Nothing seems to make his symptoms better or worse. He reports he has been seen by his primary care for these symptoms for the last several days but they are getting worse.  Pt reports compliance with medications.  He has not followed-up with cardiology as he reports he does not have one.   Record review shows the patient was seen on 09/30/2015 for similar symptoms. At that time he had pulmonary infiltrates of unknown origin. He was afebrile and did not have orthopnea or leg swelling. His oxygen saturation decreased to 90% with ambulation but he declined admission.  It was noted at that time that pt had previously been noncompliant with his HTN medications.    Past Medical History  Diagnosis Date  . CKD (chronic kidney disease) stage 2, GFR 60-89 ml/min   . CAP (community acquired pneumonia) 01/08/2015  . Foreign body, eye 2009  . Hypertension     hx/pt 01/08/2015   Past Surgical History  Procedure Laterality Date  . Eye surgery Right 2009    "removed glass"   Family History  Problem Relation Age of Onset  .  Hypertension Father    Social History  Substance Use Topics  . Smoking status: Former Smoker -- 1.00 packs/day for 8 years    Types: Cigarettes    Quit date: 10/25/2007  . Smokeless tobacco: Never Used     Comment: "quit smoking in ~ 2009  . Alcohol Use: No    Review of Systems  Constitutional: Negative for fever, diaphoresis, appetite change, fatigue and unexpected weight change.  HENT: Negative for mouth sores.   Eyes: Negative for visual disturbance.  Respiratory: Positive for shortness of breath. Negative for cough, chest tightness and wheezing.   Cardiovascular: Positive for leg swelling. Negative for chest pain.  Gastrointestinal: Negative for nausea, vomiting, abdominal pain, diarrhea and constipation.  Endocrine: Negative for polydipsia, polyphagia and polyuria.  Genitourinary: Negative for dysuria, urgency, frequency and hematuria.  Musculoskeletal: Negative for back pain and neck stiffness.  Skin: Negative for rash.  Allergic/Immunologic: Negative for immunocompromised state.  Neurological: Negative for syncope, light-headedness and headaches.  Hematological: Does not bruise/bleed easily.  Psychiatric/Behavioral: Negative for sleep disturbance. The patient is not nervous/anxious.       Allergies  Ibuprofen  Home Medications   Prior to Admission medications   Medication Sig Start Date End Date Taking? Authorizing Provider  carvedilol (COREG) 12.5 MG tablet Take 1 tablet (12.5 mg total) by mouth 2 (two) times daily with a meal. 10/08/15  Yes [name], MD  furosemide (LASIX) 40 MG tablet Take 1 tablet (40 mg total) by mouth 2 (  two) times daily. 10/18/15  Yes [name], MD  hydrALAZINE (APRESOLINE) 25 MG tablet Take 1 tablet (25 mg total) by mouth 3 (three) times daily. 10/18/15  Yes [name], MD  isosorbide mononitrate (IMDUR) 30 MG 24 hr tablet Take 1 tablet (30 mg total) by mouth daily. 10/18/15  Yes [name], MD  lisinopril (PRINIVIL,ZESTRIL) 40 MG  tablet Take 1 tablet (40 mg total) by mouth daily. 02/10/15  Yes Gaylord Shih, MD  potassium chloride SA (K-DUR,KLOR-CON) 20 MEQ tablet Take 1 tablet (20 mEq total) by mouth daily. 10/08/15  Yes [name], MD  cetirizine (ZYRTEC) 10 MG tablet Take 1 tablet (10 mg total) by mouth daily. Patient not taking: Reported on 10/27/2015 10/18/15   [name], MD  cyclobenzaprine (FLEXERIL) 5 MG tablet Take 1 tablet (5 mg total) by mouth 3 (three) times daily as needed for muscle spasms. Patient not taking: Reported on 10/27/2015 08/09/15   Linna Hoff, MD   BP 172/89 mmHg  Pulse 94  Temp(Src) 98.1 F (36.7 C) (Oral)  Resp 22  Ht  (1.854 m)  Wt 117.845 kg  BMI 34.28 kg/m2  SpO2 97% Physical Exam  Constitutional: He appears well-developed and well-nourished. No distress.  Awake, alert, nontoxic appearance  HENT:  Head: Normocephalic and atraumatic.  Mouth/Throat: Oropharynx is clear and moist. No oropharyngeal exudate.  Eyes: Conjunctivae are normal. No scleral icterus.  Neck: Normal range of motion. Neck supple.  Cardiovascular: Normal rate, regular rhythm, normal heart sounds and intact distal pulses.   Pulmonary/Chest: Effort normal. Tachypnea noted. No respiratory distress. He has no wheezes. He has rales in the right middle field, the right lower field, the left middle field and the left lower field.  Equal chest expansion  Abdominal: Soft. Bowel sounds are normal. He exhibits no mass. There is no tenderness. There is no rebound and no guarding.  Musculoskeletal: Normal range of motion. He exhibits edema.  2+ pitting edema from mid forefoot to just below bilateral knees  Neurological: He is alert.  Speech is clear and goal oriented Moves extremities without ataxia  Skin: Skin is warm and dry. He is not diaphoretic.  Psychiatric: He has a normal mood and affect.  Nursing note and vitals reviewed.   ED Course  Procedures (including critical care time) Labs Review Labs Reviewed   BASIC METABOLIC PANEL - Abnormal; Notable for the following:    Sodium 132 (*)    Chloride 95 (*)    Glucose, Bld 129 (*)    Creatinine, Ser 1.46 (*)    Calcium 8.3 (*)    GFR calc non Af Amer 56 (*)    All other components within normal limits  CBC - Abnormal; Notable for the following:    Hemoglobin 9.8 (*)    HCT 31.0 (*)    MCV 68.9 (*)    MCH 21.8 (*)    RDW 15.9 (*)    All other components within normal limits  BRAIN NATRIURETIC PEPTIDE - Abnormal; Notable for the following:    B Natriuretic Peptide 582.5 (*)    All other components within normal limits  COMPREHENSIVE METABOLIC PANEL - Abnormal; Notable for the following:    Sodium 133 (*)    Chloride 96 (*)    Creatinine, Ser 1.38 (*)    Calcium 8.4 (*)    Albumin 2.0 (*)    GFR calc non Af Amer 60 (*)    All other components within normal limits  I-STAT TROPOININ,  ED    Imaging Review Dg Chest 2 View  10/27/2015  CLINICAL DATA:  Bilateral pedal edema, cough, shortness of breath EXAM: CHEST  2 VIEW COMPARISON:  CT chest 10/04/2015 FINDINGS: There is bilateral interstitial and alveolar airspace opacities. There is no pleural effusion or pneumothorax. There is stable cardiomegaly. There is no acute osseous abnormality. IMPRESSION: Findings concerning for pulmonary edema versus multilobar pneumonia. Electronically Signed   By: Elige Ko   On: 10/27/2015 18:28   I have personally reviewed and evaluated these images and lab results as part of my medical decision-making.   EKG Interpretation None       ED ECG REPORT   Date: 10/27/2015  Rate: 94  Rhythm: normal sinus rhythm  QRS Axis: normal  Intervals: normal  ST/T Wave abnormalities: nonspecific T wave changes and early repolarization  Conduction Disutrbances:none  Narrative Interpretation: unchanged from previous  Old EKG Reviewed: unchanged  I have personally reviewed the EKG tracing and agree with the computerized printout as noted.     Echo -  01/09/15 Study Conclusions  - Left ventricle: The cavity size was mildly dilated. Wall thickness was increased in a pattern of moderate LVH. Systolic function was moderately reduced. The estimated ejection fraction was in the range of 35% to 40%. Diffuse hypokinesis. The study is not technically sufficient to allow evaluation of LV diastolic function. - Mitral valve: There was moderate regurgitation. - Left atrium: The atrium was mildly dilated. - Right atrium: The atrium was mildly dilated. - Tricuspid valve: There was moderate regurgitation. - Pulmonary arteries: PA peak pressure: 59 mm Hg (S).  MDM   Final diagnoses:  Chronic kidney disease, stage 2 (mild)  Systolic CHF, acute on chronic (HCC)  Peripheral edema  Cough  DOE (dyspnea on exertion)   [facility]. [facility] presents with shortness of breath and bilateral lower peripheral edema. On arrival to the emergency department patient with oxygen saturation of 88%.  Record review shows previous echo with EF of 35-40%.  10:45 PM BNP elevated in the setting of what appears to be a CHF exacerbation as patient has bilateral peripheral edema and rales on lung exam. Chest x-ray questions CHF exacerbation versus multilobar pneumonia. Will obtain CT scan. Patient with elevated creatinine 1.46. Discussed with [facility] hospital[facility] Dr. Robb Matar who will admit.       Dahlia Client Janeice Stegall, PA-C 10/27/15 2356  Nelva Nay, MD 10/28/15 [phone]

## 2015-10-28 ENCOUNTER — Observation Stay (HOSPITAL_COMMUNITY): Payer: Medicaid Other

## 2015-10-28 ENCOUNTER — Encounter (HOSPITAL_COMMUNITY): Payer: Self-pay | Admitting: General Practice

## 2015-10-28 DIAGNOSIS — J189 Pneumonia, unspecified organism: Secondary | ICD-10-CM | POA: Diagnosis present

## 2015-10-28 DIAGNOSIS — E876 Hypokalemia: Secondary | ICD-10-CM | POA: Diagnosis present

## 2015-10-28 DIAGNOSIS — N182 Chronic kidney disease, stage 2 (mild): Secondary | ICD-10-CM

## 2015-10-28 DIAGNOSIS — I5023 Acute on chronic systolic (congestive) heart failure: Secondary | ICD-10-CM

## 2015-10-28 DIAGNOSIS — J9601 Acute respiratory failure with hypoxia: Secondary | ICD-10-CM | POA: Diagnosis present

## 2015-10-28 DIAGNOSIS — E871 Hypo-osmolality and hyponatremia: Secondary | ICD-10-CM | POA: Diagnosis present

## 2015-10-28 DIAGNOSIS — Y95 Nosocomial condition: Secondary | ICD-10-CM | POA: Diagnosis present

## 2015-10-28 DIAGNOSIS — I10 Essential (primary) hypertension: Secondary | ICD-10-CM

## 2015-10-28 DIAGNOSIS — Z8701 Personal history of pneumonia (recurrent): Secondary | ICD-10-CM | POA: Diagnosis not present

## 2015-10-28 DIAGNOSIS — N183 Chronic kidney disease, stage 3 (moderate): Secondary | ICD-10-CM | POA: Diagnosis present

## 2015-10-28 DIAGNOSIS — Z79899 Other long term (current) drug therapy: Secondary | ICD-10-CM | POA: Diagnosis not present

## 2015-10-28 DIAGNOSIS — R0602 Shortness of breath: Secondary | ICD-10-CM | POA: Diagnosis present

## 2015-10-28 DIAGNOSIS — Z8249 Family history of ischemic heart disease and other diseases of the circulatory system: Secondary | ICD-10-CM | POA: Diagnosis not present

## 2015-10-28 DIAGNOSIS — R739 Hyperglycemia, unspecified: Secondary | ICD-10-CM | POA: Diagnosis present

## 2015-10-28 DIAGNOSIS — E669 Obesity, unspecified: Secondary | ICD-10-CM | POA: Diagnosis present

## 2015-10-28 DIAGNOSIS — T502X5A Adverse effect of carbonic-anhydrase inhibitors, benzothiadiazides and other diuretics, initial encounter: Secondary | ICD-10-CM | POA: Diagnosis present

## 2015-10-28 DIAGNOSIS — I130 Hypertensive heart and chronic kidney disease with heart failure and stage 1 through stage 4 chronic kidney disease, or unspecified chronic kidney disease: Secondary | ICD-10-CM | POA: Diagnosis present

## 2015-10-28 DIAGNOSIS — Z87891 Personal history of nicotine dependence: Secondary | ICD-10-CM | POA: Diagnosis not present

## 2015-10-28 DIAGNOSIS — Z6834 Body mass index (BMI) 34.0-34.9, adult: Secondary | ICD-10-CM | POA: Diagnosis not present

## 2015-10-28 DIAGNOSIS — D509 Iron deficiency anemia, unspecified: Secondary | ICD-10-CM | POA: Diagnosis present

## 2015-10-28 LAB — CBC WITH DIFFERENTIAL/PLATELET
BASOS PCT: 0 %
Basophils Absolute: 0 10*3/uL (ref 0.0–0.1)
EOS PCT: 2 %
Eosinophils Absolute: 0.2 10*3/uL (ref 0.0–0.7)
HEMATOCRIT: 31.4 % — AB (ref 39.0–52.0)
HEMOGLOBIN: 10.4 g/dL — AB (ref 13.0–17.0)
LYMPHS PCT: 15 %
Lymphs Abs: 1.3 10*3/uL (ref 0.7–4.0)
MCH: 22.8 pg — AB (ref 26.0–34.0)
MCHC: 33.1 g/dL (ref 30.0–36.0)
MCV: 68.7 fL — AB (ref 78.0–100.0)
MONO ABS: 0.8 10*3/uL (ref 0.1–1.0)
MONOS PCT: 9 %
NEUTROS PCT: 74 %
Neutro Abs: 6.4 10*3/uL (ref 1.7–7.7)
PLATELETS: 354 10*3/uL (ref 150–400)
RBC: 4.57 MIL/uL (ref 4.22–5.81)
RDW: 16.1 % — ABNORMAL HIGH (ref 11.5–15.5)
WBC: 8.7 10*3/uL (ref 4.0–10.5)

## 2015-10-28 LAB — COMPREHENSIVE METABOLIC PANEL
ALBUMIN: 2.1 g/dL — AB (ref 3.5–5.0)
ALT: 24 U/L (ref 17–63)
ANION GAP: 10 (ref 5–15)
AST: 18 U/L (ref 15–41)
Alkaline Phosphatase: 62 U/L (ref 38–126)
BILIRUBIN TOTAL: 0.5 mg/dL (ref 0.3–1.2)
BUN: 17 mg/dL (ref 6–20)
CO2: 25 mmol/L (ref 22–32)
Calcium: 8.2 mg/dL — ABNORMAL LOW (ref 8.9–10.3)
Chloride: 96 mmol/L — ABNORMAL LOW (ref 101–111)
Creatinine, Ser: 1.47 mg/dL — ABNORMAL HIGH (ref 0.61–1.24)
GFR, EST NON AFRICAN AMERICAN: 55 mL/min — AB (ref >60)
Glucose, Bld: 179 mg/dL — ABNORMAL HIGH (ref 65–99)
POTASSIUM: 3.4 mmol/L — AB (ref 3.5–5.1)
Sodium: 131 mmol/L — ABNORMAL LOW (ref 135–145)
TOTAL PROTEIN: 6.8 g/dL (ref 6.5–8.1)

## 2015-10-28 LAB — RETICULOCYTES
RBC.: 4.37 MIL/uL (ref 4.22–5.81)
RETIC COUNT ABSOLUTE: 122.4 10*3/uL (ref 19.0–186.0)
RETIC CT PCT: 2.8 % (ref 0.4–3.1)

## 2015-10-28 LAB — OSMOLALITY, URINE: Osmolality, Ur: 678 mOsm/kg (ref 300–900)

## 2015-10-28 LAB — CBC
HEMATOCRIT: 31.1 % — AB (ref 39.0–52.0)
HEMOGLOBIN: 10.4 g/dL — AB (ref 13.0–17.0)
MCH: 23 pg — AB (ref 26.0–34.0)
MCHC: 33.4 g/dL (ref 30.0–36.0)
MCV: 68.7 fL — AB (ref 78.0–100.0)
Platelets: 339 10*3/uL (ref 150–400)
RBC: 4.53 MIL/uL (ref 4.22–5.81)
RDW: 15.9 % — ABNORMAL HIGH (ref 11.5–15.5)
WBC: 8.6 10*3/uL (ref 4.0–10.5)

## 2015-10-28 LAB — STREP PNEUMONIAE URINARY ANTIGEN: STREP PNEUMO URINARY ANTIGEN: NEGATIVE

## 2015-10-28 LAB — TROPONIN I: TROPONIN I: 0.25 ng/mL — AB (ref <0.031)

## 2015-10-28 LAB — SODIUM, URINE, RANDOM: Sodium, Ur: 22 mmol/L

## 2015-10-28 LAB — OSMOLALITY: OSMOLALITY: 280 mosm/kg (ref 275–295)

## 2015-10-28 LAB — PHOSPHORUS: Phosphorus: 3.5 mg/dL (ref 2.5–4.6)

## 2015-10-28 LAB — VITAMIN B12: VITAMIN B 12: 222 pg/mL (ref 180–914)

## 2015-10-28 LAB — INFLUENZA PANEL BY PCR (TYPE A & B)
H1N1 flu by pcr: NOT DETECTED
Influenza A By PCR: NEGATIVE
Influenza B By PCR: NEGATIVE

## 2015-10-28 LAB — MAGNESIUM: Magnesium: 1.7 mg/dL (ref 1.7–2.4)

## 2015-10-28 LAB — IRON AND TIBC
Iron: 29 ug/dL — ABNORMAL LOW (ref 45–182)
Saturation Ratios: 12 % — ABNORMAL LOW (ref 17.9–39.5)
TIBC: 245 ug/dL — ABNORMAL LOW (ref 250–450)
UIBC: 216 ug/dL

## 2015-10-28 LAB — CREATININE, SERUM
CREATININE: 1.44 mg/dL — AB (ref 0.61–1.24)
GFR calc Af Amer: >60 mL/min (ref >60)
GFR calc non Af Amer: 57 mL/min — ABNORMAL LOW (ref >60)

## 2015-10-28 LAB — FOLATE: Folate: 7.8 ng/mL (ref >5.9)

## 2015-10-28 LAB — FERRITIN: Ferritin: 190 ng/mL (ref 24–336)

## 2015-10-28 MED ORDER — HYDRALAZINE HCL 25 MG PO TABS
25.0000 mg | ORAL_TABLET | Freq: Three times a day (TID) | ORAL | Status: DC
  Administered 2015-10-28 – 2015-11-01 (×13): 25 mg via ORAL
  Filled 2015-10-28 (×13): qty 1

## 2015-10-28 MED ORDER — DEXTROSE 5 % IV SOLN
2.0000 g | Freq: Once | INTRAVENOUS | Status: AC
  Administered 2015-10-28: 2 g via INTRAVENOUS
  Filled 2015-10-28: qty 2

## 2015-10-28 MED ORDER — POTASSIUM CHLORIDE CRYS ER 20 MEQ PO TBCR
20.0000 meq | EXTENDED_RELEASE_TABLET | Freq: Every day | ORAL | Status: DC
  Administered 2015-10-28 – 2015-11-01 (×5): 20 meq via ORAL
  Filled 2015-10-28 (×5): qty 1

## 2015-10-28 MED ORDER — ISOSORBIDE MONONITRATE ER 30 MG PO TB24
30.0000 mg | ORAL_TABLET | Freq: Every day | ORAL | Status: DC
Start: 2015-10-28 — End: 2015-11-01
  Administered 2015-10-28 – 2015-11-01 (×5): 30 mg via ORAL
  Filled 2015-10-28 (×5): qty 1

## 2015-10-28 MED ORDER — DEXTROSE 5 % IV SOLN
2.0000 g | Freq: Two times a day (BID) | INTRAVENOUS | Status: DC
  Administered 2015-10-29 – 2015-10-31 (×7): 2 g via INTRAVENOUS
  Filled 2015-10-28 (×8): qty 2

## 2015-10-28 MED ORDER — SODIUM CHLORIDE 0.9% FLUSH
3.0000 mL | Freq: Two times a day (BID) | INTRAVENOUS | Status: DC
  Administered 2015-10-28 – 2015-11-01 (×10): 3 mL via INTRAVENOUS

## 2015-10-28 MED ORDER — IPRATROPIUM-ALBUTEROL 0.5-2.5 (3) MG/3ML IN SOLN: 3.0000 mL | Freq: Four times a day (QID) | RESPIRATORY_TRACT | Status: DC | PRN

## 2015-10-28 MED ORDER — VANCOMYCIN HCL IN DEXTROSE 750-5 MG/150ML-% IV SOLN
750.0000 mg | Freq: Two times a day (BID) | INTRAVENOUS | Status: DC
  Administered 2015-10-29 – 2015-10-31 (×7): 750 mg via INTRAVENOUS
  Filled 2015-10-28 (×10): qty 150

## 2015-10-28 MED ORDER — CARVEDILOL 12.5 MG PO TABS: 12.5000 mg | ORAL_TABLET | Freq: Two times a day (BID) | ORAL | Status: DC

## 2015-10-28 MED ORDER — LOSARTAN POTASSIUM 50 MG PO TABS
100.0000 mg | ORAL_TABLET | Freq: Every day | ORAL | Status: DC
  Administered 2015-10-29 – 2015-11-01 (×4): 100 mg via ORAL
  Filled 2015-10-28 (×4): qty 2

## 2015-10-28 MED ORDER — MAGNESIUM SULFATE 2 GM/50ML IV SOLN
2.0000 g | Freq: Once | INTRAVENOUS | Status: AC
  Administered 2015-10-28: 2 g via INTRAVENOUS
  Filled 2015-10-28: qty 50

## 2015-10-28 MED ORDER — CEFTRIAXONE SODIUM 1 G IJ SOLR
1.0000 g | Freq: Every day | INTRAMUSCULAR | Status: DC
  Administered 2015-10-28: 1 g via INTRAVENOUS
  Filled 2015-10-28 (×2): qty 10

## 2015-10-28 MED ORDER — CARVEDILOL 12.5 MG PO TABS
12.5000 mg | ORAL_TABLET | Freq: Two times a day (BID) | ORAL | Status: DC
  Administered 2015-10-28 – 2015-11-01 (×9): 12.5 mg via ORAL
  Filled 2015-10-28 (×9): qty 1

## 2015-10-28 MED ORDER — FUROSEMIDE 10 MG/ML IJ SOLN
60.0000 mg | Freq: Once | INTRAMUSCULAR | Status: AC
Start: 2015-10-28 — End: 2015-10-28
  Administered 2015-10-28: 60 mg via INTRAVENOUS
  Filled 2015-10-28: qty 6

## 2015-10-28 MED ORDER — FUROSEMIDE 10 MG/ML IJ SOLN
80.0000 mg | Freq: Two times a day (BID) | INTRAMUSCULAR | Status: DC
  Administered 2015-10-28 – 2015-10-31 (×6): 80 mg via INTRAVENOUS
  Filled 2015-10-28 (×6): qty 8

## 2015-10-28 MED ORDER — DOXYCYCLINE HYCLATE 100 MG PO TABS
100.0000 mg | ORAL_TABLET | Freq: Two times a day (BID) | ORAL | Status: DC
  Administered 2015-10-28: 100 mg via ORAL
  Filled 2015-10-28: qty 1

## 2015-10-28 MED ORDER — SPIRONOLACTONE 25 MG PO TABS
12.5000 mg | ORAL_TABLET | Freq: Every day | ORAL | Status: DC
  Administered 2015-10-28 – 2015-11-01 (×5): 12.5 mg via ORAL
  Filled 2015-10-28 (×5): qty 1

## 2015-10-28 MED ORDER — LISINOPRIL 40 MG PO TABS
40.0000 mg | ORAL_TABLET | Freq: Every day | ORAL | Status: DC
  Administered 2015-10-28: 40 mg via ORAL
  Filled 2015-10-28: qty 1

## 2015-10-28 MED ORDER — VANCOMYCIN HCL 10 G IV SOLR
2000.0000 mg | Freq: Once | INTRAVENOUS | Status: AC
  Administered 2015-10-28: 2000 mg via INTRAVENOUS
  Filled 2015-10-28: qty 2000

## 2015-10-28 MED ORDER — ENOXAPARIN SODIUM 60 MG/0.6ML ~~LOC~~ SOLN
60.0000 mg | Freq: Every day | SUBCUTANEOUS | Status: DC
  Administered 2015-10-28 – 2015-10-31 (×5): 60 mg via SUBCUTANEOUS
  Filled 2015-10-28 (×5): qty 0.6

## 2015-10-28 NOTE — Progress Notes (Signed)
[facility]                                                                              Patient Demographics  Austin Mccarty, is a 50 y.o. male, DOB - 01-Feb-1966, ZOX:[id number]  Admit date - 10/27/2015   Admitting Physician Bobette Mo, MD  Outpatient Primary MD for the patient is [name], MD  LOS -    Chief Complaint  Patient presents with  . Shortness of Breath  . Leg Swelling       Brief HPI  Austin Mccarty is a 50 y.o. male with a past medical history of chronic systolic CHF, hypertension, stage II CKD, recently admitted and discharged for community-acquired pneumonia presented to ED due to progressively worse dyspnea and lower extremity edema for about a week. Per patient, he was admitted and discharge for community-acquired pneumonia last month. He reported that he was doing well post discharge, but for the past 2 weeks started noticing increased lower extremity edema. Since about a week ago, he started noticing progressively worse dyspnea, coughing and had his furosemide dose 4 days ago. He denied chest pain, palpitations, orthopnea or PND, but complains of occasional positional dizziness. He also reported persistent episodes of coughing, wheezing, subjective fevers, chills, fatigue, malaise and loss of appetite.      Assessment & Plan    Principal Problem:  Acute hypoxic respiratory failure with acute on chronic Systolic CHF, multifocal pneumonia - Continue IV Lasix, BNP 582.5 - Obtain serial cardiac enzymes - Creatinine 1.4 - Continue lisinopril, carvedilol, hydralazine and isosorbide mononitrate. - Cardiology consult called, patient follows Dr. Daleen Squibb, - Obtain 2-D echocardiogram, previous echo/16 showed EF of 35-40% with diffuse hypokinesis  Active Problems: Multifocal pneumonia - CT chest this morning shows diffuse multifocal pneumonia although has interval improvement, degree of congestive changes - For now placed on IV  vancomycin and cefepime, HCAP - Also check influenza panel, urine strep antigen negative  - Cough could be related to ACE inhibitor   Essential hypertension - Continue Coreg, hydralazine, lisinopril   Hyponatremia - Possible SIADH from multifocal pneumonia however patient is also on diuresis, lisinopril - Check urine osmolarity, serum osmolarity, UNA   chronic kidney disease stage II - Baseline creatinine 1.1-1.4 - Renal ultrasound 12/2014 consistent with medical renal disease, follow closely with diuresis   Hypokalemia Replacing.   Anemia - Follow anemia panel  Code Status:   Family Communication: Discussed in detail with the patient, all imaging results, lab results explained to the patient   Disposition Plan:  Time Spent in minutes 25 minutes  Procedures  CT chest  Consults   Cardiology  DVT Prophylaxis  Lovenox  Medications  Scheduled Meds: . carvedilol  12.5 mg Oral BID WC  . ceFEPime (MAXIPIME) IV  2 g Intravenous Once  . [START ON 10/29/2015] ceFEPime (MAXIPIME) IV  2 g Intravenous Q12H  . enoxaparin (LOVENOX) injection  60 mg Subcutaneous QHS  . hydrALAZINE  25 mg Oral TID  . isosorbide mononitrate  30 mg Oral Daily  . lisinopril  40 mg Oral Daily  . potassium chloride SA  20 mEq Oral Daily  .  sodium chloride flush  3 mL Intravenous Q12H  . vancomycin  750 mg Intravenous Q12H   Continuous Infusions:  PRN Meds:.ipratropium-albuterol   Antibiotics   Anti-infectives    Start     Dose/Rate Route Frequency Ordered Stop   10/29/15 0000  ceFEPIme (MAXIPIME) 2 g in dextrose 5 % 50 mL IVPB     2 g 100 mL/hr over 30 Minutes Intravenous Every 12 hours 10/28/15 1144     10/28/15 2200  vancomycin (VANCOCIN) IVPB 750 mg/150 ml premix     750 mg 150 mL/hr over 60 Minutes Intravenous Every 12 hours 10/28/15 1144     10/28/15 1200  ceFEPIme (MAXIPIME) 2 g in dextrose 5 % 50 mL IVPB     2 g 100 mL/hr over 30 Minutes Intravenous  Once 10/28/15 0850      10/28/15 0930  vancomycin (VANCOCIN) 2,000 mg in sodium chloride 0.9 % 500 mL IVPB     2,000 mg 250 mL/hr over 120 Minutes Intravenous  Once 10/28/15 0850 10/28/15 1155   10/28/15 0130  cefTRIAXone (ROCEPHIN) 1 g in dextrose 5 % 50 mL IVPB  Status:  Discontinued     1 g 100 mL/hr over 30 Minutes Intravenous Daily at bedtime 10/28/15 0103 10/28/15 0846   10/28/15 0115  doxycycline (VIBRA-TABS) tablet 100 mg  Status:  Discontinued     100 mg Oral Every 12 hours 10/28/15 0103 10/28/15 0846        Subjective:   Austin Mccarty was seen and examined today. No chest pain, shortness of breath is improving, significant peripheral edema still coughing. No fevers or chills or wheezing. Patient denies dizziness, abdominal pain, N/V/D/C, new weakness, numbess, tingling. No acute events overnight.    Objective:   Blood pressure 156/78, pulse 80, temperature 98.2 F (36.8 C), temperature source Oral, resp. rate 22, height 6\' 1"  (1.854 m), weight 116.393 kg (256 lb 9.6 oz), SpO2 96 %.  Wt Readings from Last 3 Encounters:  10/28/15 116.393 kg (256 lb 9.6 oz)  10/27/15 118.752 kg (261 lb 12.8 oz)  10/25/15 119.296 kg (263 lb)     Intake/Output Summary (Last 24 hours) at 10/28/15 1209 Last data filed at 10/28/15 0855  Gross per 24 hour  Intake    720 ml  Output   2000 ml  Net  -1280 ml    Exam  General: Alert and oriented x 3, NAD  HEENT:  PERRLA, EOMI, Anicteric Sclera, mucous membranes moist.   Neck: Supple, no JVD, no masses  CVS: S1 S2 auscultated, no rubs, murmurs or gallops. Regular rate and rhythm.  Respiratory: Bilateral rhonchi and bibasilar crackles   Abdomen: Soft, nontender, nondistended, + bowel sounds  Ext: no cyanosis clubbing, 2-3+  edema  Neuro: AAOx3, Cr N's II- XII. Strength 5/5 upper and lower extremities bilaterally  Skin: No rashes  Psych: Normal affect and demeanor, alert and oriented x3    Data Review   Micro Results Recent Results (from the past  240 hour(s))  Strep A DNA Probe     Status: None   Collection Time: 10/18/15  4:17 PM  Result Value Ref Range Status   GASP NOT DETECTED  Final   GASP NOD  Final    Radiology Reports Dg Chest 2 View  10/27/2015  CLINICAL DATA:  Bilateral pedal edema, cough, shortness of breath EXAM: CHEST  2 VIEW COMPARISON:  CT chest 10/04/2015 FINDINGS: There is bilateral interstitial and alveolar airspace opacities. There is no pleural effusion or  pneumothorax. There is stable cardiomegaly. There is no acute osseous abnormality. IMPRESSION: Findings concerning for pulmonary edema versus multilobar pneumonia. Electronically Signed   By: Elige Ko   On: 10/27/2015 18:28   Dg Chest 2 View  10/03/2015  CLINICAL DATA:  Shortness of breath and lower extremity edema. Chronic renal failure EXAM: CHEST  2 VIEW COMPARISON:  September 29, 2015 FINDINGS: There is widespread interstitial and alveolar opacity throughout the lungs, stable. There is cardiomegaly with pulmonary venous hypertension. No adenopathy. No appreciable effusions. No demonstrable bone lesions. IMPRESSION: Widespread interstitial and alveolar opacity bilaterally with cardiomegaly and pulmonary venous hypertension. Differential considerations for this extensive abnormality include congestive heart failure. Widespread pneumonia, Goodpasture's disease with hemorrhage, hypersensitivity pneumonitis, and ARDS. More than 1 of these entities may well exist concurrently. The appearance is stable compared to recent prior study. Electronically Signed   By: Bretta Bang III M.D.   On: 10/03/2015 20:39   Dg Chest 2 View  09/29/2015  CLINICAL DATA:  Cough and fever or 1 week EXAM: CHEST - 2 VIEW COMPARISON:  01/08/2015 FINDINGS: Cardiac shadow is mildly prominent but stable in appearance from the prior exam. There again noted bilateral multifocal infiltrative densities right slightly worse than left. They have is increased slightly in the interval from the prior  exam. IMPRESSION: Recurrent bilateral infiltrates right greater than left. Although pulmonary edema deserves consideration these likely represent a multifocal infectious etiology. Electronically Signed   By: Alcide Clever M.D.   On: 09/29/2015 16:24   Ct Chest Wo Contrast  10/28/2015  CLINICAL DATA:  50 year old male with shortness of breath AP EXAM: CT CHEST WITHOUT CONTRAST TECHNIQUE: Multidetector CT imaging of the chest was performed following the standard protocol without IV contrast. COMPARISON:  Radiograph dated 10/27/2015 FINDINGS: Evaluation of this exam is limited in the absence of intravenous contrast. There bilateral multi focal airspace ground-glass opacities as well as areas of nodularity most compatible with multifocal pneumonia. The degree of congestive changes may be present. Overall the appearance of the ovary densities have improved compared to the prior study. There is no pleural effusion or pneumothorax. The central airways are patent. The thoracic aorta and central pulmonary arteries are grossly unremarkable on this noncontrast study. Top-normal cardiac size. There is hypoattenuation of the cardiac blood pool suggestive of a degree of anemia. Clinical correlation is recommended. There mediastinal adenopathy most prominent involving the prevascular and right paratracheal space. The esophagus is grossly unremarkable. There is no axillary adenopathy. There is diffuse subcutaneous soft tissue stranding of the thoracic wall. Bold right posterior rib fractures noted. No acute fracture. The visualized upper abdomen is grossly unremarkable. IMPRESSION: Diffuse displaced and nodular opacities concerning for multifocal pneumonia. The overall there has been interval improvement in the pulmonary opacities compared to the prior study. A degree of congestive changes is likely present as well. Correlation with clinical exam recommended. Electronically Signed   By: Elgie Collard M.D.   On: 10/28/2015 00:32    Ct Chest Wo Contrast  10/04/2015  CLINICAL DATA:  50 year old male with shortness of breath EXAM: CT CHEST WITHOUT CONTRAST TECHNIQUE: Multidetector CT imaging of the chest was performed following the standard protocol without IV contrast. COMPARISON:  Chest radiograph dated 10/03/2015 and CT dated 01/08/2015 FINDINGS: Evaluation of this exam is limited in the absence of intravenous contrast. There is multi focal airspace opacities involving upper and lower lobes bilaterally with areas of air bronchogram most compatible with multifocal pneumonia. Pulmonary edema is not excluded. Clinical correlation  is recommended. There is no pleural effusion or pneumothorax. The central airways are patent. The thoracic aorta and pulmonary arteries are grossly unremarkable on this noncontrast study. There is no cardiomegaly or pericardial effusion. There is mild approximation of the cardiac blood pool suggestive of a degree of anemia. Mildly enlarged paratracheal and mediastinal adenopathy. The esophagus is grossly unremarkable. No thyroid nodule identified. The chest wall soft tissues appear unremarkable. Mild degenerative changes of the spine. Old right seventh rib fracture. No acute fracture. The visualized upper abdomen appears unremarkable. IMPRESSION: Bilateral airspace opacity most compatible with multifocal pneumonia. Underlying edema is not excluded. Clinical correlation and follow-up recommended. Electronically Signed   By: Elgie Collard M.D.   On: 10/04/2015 02:43    CBC  Recent Labs Lab 10/27/15 1742 10/28/15 0037 10/28/15 0246  WBC 8.5 8.6 8.7  HGB 9.8* 10.4* 10.4*  HCT 31.0* 31.1* 31.4*  PLT 308 339 354  MCV 68.9* 68.7* 68.7*  MCH 21.8* 23.0* 22.8*  MCHC 31.6 33.4 33.1  RDW 15.9* 15.9* 16.1*  LYMPHSABS  --   --  1.3  MONOABS  --   --  0.8  EOSABS  --   --  0.2  BASOSABS  --   --  0.0    Chemistries   Recent Labs Lab 10/27/15 1742 10/27/15 2212 10/28/15 0037 10/28/15 0246  NA  132* 133*  --  131*  K 3.9 3.6  --  3.4*  CL 95* 96*  --  96*  CO2 26 26  --  25  GLUCOSE 129* 99  --  179*  BUN 17 16  --  17  CREATININE 1.46* 1.38* 1.44* 1.47*  CALCIUM 8.3* 8.4*  --  8.2*  MG  --   --  1.7  --   AST  --  19  --  18  ALT  --  23  --  24  ALKPHOS  --  65  --  62  BILITOT  --  0.6  --  0.5   ------------------------------------------------------------------------------------------------------------------ estimated creatinine clearance is 83 mL/min (by C-G formula based on Cr of 1.47). ------------------------------------------------------------------------------------------------------------------ No results for input(s): HGBA1C in the last 72 hours. ------------------------------------------------------------------------------------------------------------------ No results for input(s): CHOL, HDL, LDLCALC, TRIG, CHOLHDL, LDLDIRECT in the last 72 hours. ------------------------------------------------------------------------------------------------------------------ No results for input(s): TSH, T4TOTAL, T3FREE, THYROIDAB in the last 72 hours.  Invalid input(s): FREET3 ------------------------------------------------------------------------------------------------------------------ No results for input(s): VITAMINB12, FOLATE, FERRITIN, TIBC, IRON, RETICCTPCT in the last 72 hours.  Coagulation profile No results for input(s): INR, PROTIME in the last 168 hours.  No results for input(s): DDIMER in the last 72 hours.  Cardiac Enzymes  Recent Labs Lab 10/28/15 0037 10/28/15 0912  TROPONINI <0.03 <0.03   ------------------------------------------------------------------------------------------------------------------ Invalid input(s): POCBNP  No results for input(s): GLUCAP in the last 72 hours.   Aarit Kashuba M.D. [facility] 10/28/2015, 12:09 PM  Pager: [phone] Between 7am to 7pm - call Pager - [phone]  After 7pm go to [email or web address] -  password [email or web address]  Call night coverage person covering after 7pm

## 2015-10-28 NOTE — Consult Note (Signed)
CARDIOLOGY CONSULT NOTE  Patient ID: Austin Mccarty, MRN: [id number], DOB/AGE: 1966-08-01 50 y.o. Admit date: 10/27/2015 Date of Consult: 10/28/2015  Primary Physician: [name], MD Primary Cardiologist: None Referring Physician: Dr. Robb Matar  Chief Complaint: Cough and leg swelling Reason for Consultation: CHF  HPI:  Austin Mccarty is a 50yo man with PMHx of HTN, CKD stage 2, and chronic systolic CHF (last echo on 01/09/15 with EF 35-40%, diffuse hypokinesis) who presented on 10/27/15 with progressively worsening shortness of breath, dry cough, and leg swelling. He was recently hospitalized from 1/16-1/17 for sepsis secondary to multifocal pneumonia and acute on chronic CHF exacerbation. His discharge weight was 272 lbs. He completed a 5 day course of Cefuroxime 500 mg BID. He had good follow up with his PCP and there was concern for volume overload. At his visit on 1/20 he was 270 lbs so his Lasix was increased from 20 mg daily on hospital discharge to 40 mg daily. He continued to have SOB and leg swelling at his 1/30 visit so his Lasix was increased to 40 mg BID. His BNP was elevated at 1698 at that time. He followed up with his PCP again on 2/6 but had not increased his Lasix, only taking 40 mg once daily. He was also noted to have decreased oxygen saturation at 90% and was placed on oxygen via nasal cannula. He was evaluated again on 2/8 by his PCP and recommended to go to the hospital for IV diuresis.   In the ED, his BNP was only mildly elevated at 582. He had a CXR that was concerning for a multifocal PNA. He had a subsequent CT Chest which was consistent with this picture. He was started on IV Vanc and Cefepime. His weight on admission was 262 lbs and he was diuresed with Lasix 60 mg IV x 1 with 2L UOP (down 6 lbs). His Cr is stable at 1.4, with his baseline between 1.1-1.4.   Today, the patient states his dry cough is bothering him the most. He reports he initially had left ankle swelling  that progressed to the right ankle and then eventually both legs became swollen up to the knees. He denies orthopnea, but reports waking up in the middle of the night due to coughing. He denies any fevers, chills, chest pain, or productive cough. He reports his normal weight is 245 lbs. He states he used to follow with a cardiologist, [facility] but now only sees his PCP.   Medical History:  Past Medical History  Diagnosis Date  . CKD (chronic kidney disease) stage 2, GFR 60-89 ml/min   . CAP (community acquired pneumonia) 01/08/2015  . Foreign body, eye 2009  . Hypertension     hx/pt 01/08/2015  . Chronic systolic (congestive) heart failure [facility])       Surgical History:  Past Surgical History  Procedure Laterality Date  . Eye surgery Right 2009    "removed glass"     Home Meds: Prior to Admission medications   Medication Sig Start Date End Date Taking? Authorizing Provider  carvedilol (COREG) 12.5 MG tablet Take 1 tablet (12.5 mg total) by mouth 2 (two) times daily with a meal. 10/08/15  Yes [name], MD  furosemide (LASIX) 40 MG tablet Take 1 tablet (40 mg total) by mouth 2 (two) times daily. 10/18/15  Yes [name], MD  hydrALAZINE (APRESOLINE) 25 MG tablet Take 1 tablet (25 mg total) by mouth  3 (three) times daily. 10/18/15  Yes [name], MD  isosorbide mononitrate (IMDUR) 30 MG 24 hr tablet Take 1 tablet (30 mg total) by mouth daily. 10/18/15  Yes [name], MD  lisinopril (PRINIVIL,ZESTRIL) 40 MG tablet Take 1 tablet (40 mg total) by mouth daily. 02/10/15  Yes Gaylord Shih, MD  potassium chloride SA (K-DUR,KLOR-CON) 20 MEQ tablet Take 1 tablet (20 mEq total) by mouth daily. 10/08/15  Yes [name], MD  cetirizine (ZYRTEC) 10 MG tablet Take 1 tablet (10 mg total) by mouth daily. Patient not taking: Reported on 10/27/2015 10/18/15   [name], MD  cyclobenzaprine (FLEXERIL) 5 MG tablet Take 1 tablet (5 mg total) by mouth 3  (three) times daily as needed for muscle spasms. Patient not taking: Reported on 10/27/2015 08/09/15   Linna Hoff, MD    Inpatient Medications:  . carvedilol  12.5 mg Oral BID WC  . ceFEPime (MAXIPIME) IV  2 g Intravenous Once  . enoxaparin (LOVENOX) injection  60 mg Subcutaneous QHS  . hydrALAZINE  25 mg Oral TID  . isosorbide mononitrate  30 mg Oral Daily  . lisinopril  40 mg Oral Daily  . potassium chloride SA  20 mEq Oral Daily  . sodium chloride flush  3 mL Intravenous Q12H  . vancomycin  2,000 mg Intravenous Once      Allergies:  Allergies  Allergen Reactions  . Ibuprofen Itching and Swelling    Social History   Social History  . Marital Status: Married    Spouse Name: N/A  . Mccarty of Children: N/A  . Years of Education: N/A   Occupational History  . Not on file.   Social History Main Topics  . Smoking status: Former Smoker -- 1.00 packs/day for 8 years    Types: Cigarettes    Quit date: 10/25/2007  . Smokeless tobacco: Never Used     Comment: "quit smoking in ~ 2009  . Alcohol Use: No  . Drug Use: No  . Sexual Activity: Yes   Other Topics Concern  . Not on file   Social History Narrative     Family History  Problem Relation Age of Onset  . Hypertension Father      Review of Systems: General: negative for night sweats or weight changes.  ENT: negative for rhinorrhea or epistaxis Cardiovascular: negative for dyspnea on exertion, palpitations Dermatological: negative for rash Respiratory: negative for wheezing GI: negative for nausea, vomiting, diarrhea, bright red blood per rectum, melena, or hematemesis GU: no hematuria, urgency, or frequency Neurologic: negative for visual changes, syncope, headache, or dizziness Heme: no easy bruising or bleeding Endo: negative for excessive thirst, thyroid disorder, or flushing Musculoskeletal: negative for joint pain or swelling, negative for myalgias  All other systems reviewed and are otherwise  negative except as noted above.  Physical Exam: Blood pressure 156/78, pulse 80, temperature 98.2 F (36.8 C), temperature source Oral, resp. rate 22, height  (1.854 m), weight 256 lb 9.6 oz (116.393 kg), SpO2 96 %. General: middle aged african american man sitting up in bed, pleasant, NAD HEENT: [place]/AT, EOMI, sclera anicteric, mucus membranes moist Neck: supple, no JVD appreciated  CV: RRR, no m/g/r Pulm: rhonchi bilaterally, crackles at bases, no wheezing, breaths non-labored on room air Abd: BS+, soft, obese, non-tender  Ext: warm, 3+ pitting edema up to the knees bilaterally, no tenderness to palpation of calves Neuro: alert and oriented x 3, no focal deficits     Labs:  Recent  Labs  10/28/15 0037 10/28/15 0912  TROPONINI <0.03 <0.03   Lab Results  Component Value Date   WBC 8.7 10/28/2015   HGB 10.4* 10/28/2015   HCT 31.4* 10/28/2015   MCV 68.7* 10/28/2015   PLT 354 10/28/2015    Recent Labs Lab 10/28/15 0246  NA 131*  K 3.4*  CL 96*  CO2 25  BUN 17  CREATININE 1.47*  CALCIUM 8.2*  PROT 6.8  BILITOT 0.5  ALKPHOS 62  ALT 24  AST 18  GLUCOSE 179*   Lab Results  Component Value Date   CHOL 134 01/16/2008   HDL 41 01/16/2008   LDLCALC 79 01/16/2008   TRIG 70 01/16/2008   Lab Results  Component Value Date   DDIMER 0.47 01/08/2015    Radiology/Studies:  Dg Chest 2 View  10/27/2015  CLINICAL DATA:  Bilateral pedal edema, cough, shortness of breath EXAM: CHEST  2 VIEW COMPARISON:  CT chest 10/04/2015 FINDINGS: There is bilateral interstitial and alveolar airspace opacities. There is no pleural effusion or pneumothorax. There is stable cardiomegaly. There is no acute osseous abnormality. IMPRESSION: Findings concerning for pulmonary edema versus multilobar pneumonia. Electronically Signed   By: Elige Ko   On: 10/27/2015 18:28   Dg Chest 2 View  10/03/2015  CLINICAL DATA:  Shortness of breath and lower extremity edema. Chronic renal failure EXAM:  CHEST  2 VIEW COMPARISON:  September 29, 2015 FINDINGS: There is widespread interstitial and alveolar opacity throughout the lungs, stable. There is cardiomegaly with pulmonary venous hypertension. No adenopathy. No appreciable effusions. No demonstrable bone lesions. IMPRESSION: Widespread interstitial and alveolar opacity bilaterally with cardiomegaly and pulmonary venous hypertension. Differential considerations for this extensive abnormality include congestive heart failure. Widespread pneumonia, Goodpasture's disease with hemorrhage, hypersensitivity pneumonitis, and ARDS. More than 1 of these entities may well exist concurrently. The appearance is stable compared to recent prior study. Electronically Signed   By: Bretta Bang III M.D.   On: 10/03/2015 20:39   Dg Chest 2 View  09/29/2015  CLINICAL DATA:  Cough and fever or 1 week EXAM: CHEST - 2 VIEW COMPARISON:  01/08/2015 FINDINGS: Cardiac shadow is mildly prominent but stable in appearance from the prior exam. There again noted bilateral multifocal infiltrative densities right slightly worse than left. They have is increased slightly in the interval from the prior exam. IMPRESSION: Recurrent bilateral infiltrates right greater than left. Although pulmonary edema deserves consideration these likely represent a multifocal infectious etiology. Electronically Signed   By: Alcide Clever M.D.   On: 09/29/2015 16:24   Ct Chest Wo Contrast  10/28/2015  CLINICAL DATA:  50 year old male with shortness of breath AP EXAM: CT CHEST WITHOUT CONTRAST TECHNIQUE: Multidetector CT imaging of the chest was performed following the standard protocol without IV contrast. COMPARISON:  Radiograph dated 10/27/2015 FINDINGS: Evaluation of this exam is limited in the absence of intravenous contrast. There bilateral multi focal airspace ground-glass opacities as well as areas of nodularity most compatible with multifocal pneumonia. The degree of congestive changes may be  present. Overall the appearance of the ovary densities have improved compared to the prior study. There is no pleural effusion or pneumothorax. The central airways are patent. The thoracic aorta and central pulmonary arteries are grossly unremarkable on this noncontrast study. Top-normal cardiac size. There is hypoattenuation of the cardiac blood pool suggestive of a degree of anemia. Clinical correlation is recommended. There mediastinal adenopathy most prominent involving the prevascular and right paratracheal space. The esophagus is grossly unremarkable.  There is no axillary adenopathy. There is diffuse subcutaneous soft tissue stranding of the thoracic wall. Bold right posterior rib fractures noted. No acute fracture. The visualized upper abdomen is grossly unremarkable. IMPRESSION: Diffuse displaced and nodular opacities concerning for multifocal pneumonia. The overall there has been interval improvement in the pulmonary opacities compared to the prior study. A degree of congestive changes is likely present as well. Correlation with clinical exam recommended. Electronically Signed   By: Elgie Collard M.D.   On: 10/28/2015 00:32   Ct Chest Wo Contrast  10/04/2015  CLINICAL DATA:  50 year old male with shortness of breath EXAM: CT CHEST WITHOUT CONTRAST TECHNIQUE: Multidetector CT imaging of the chest was performed following the standard protocol without IV contrast. COMPARISON:  Chest radiograph dated 10/03/2015 and CT dated 01/08/2015 FINDINGS: Evaluation of this exam is limited in the absence of intravenous contrast. There is multi focal airspace opacities involving upper and lower lobes bilaterally with areas of air bronchogram most compatible with multifocal pneumonia. Pulmonary edema is not excluded. Clinical correlation is recommended. There is no pleural effusion or pneumothorax. The central airways are patent. The thoracic aorta and pulmonary arteries are grossly unremarkable on this noncontrast  study. There is no cardiomegaly or pericardial effusion. There is mild approximation of the cardiac blood pool suggestive of a degree of anemia. Mildly enlarged paratracheal and mediastinal adenopathy. The esophagus is grossly unremarkable. No thyroid nodule identified. The chest wall soft tissues appear unremarkable. Mild degenerative changes of the spine. Old right seventh rib fracture. No acute fracture. The visualized upper abdomen appears unremarkable. IMPRESSION: Bilateral airspace opacity most compatible with multifocal pneumonia. Underlying edema is not excluded. Clinical correlation and follow-up recommended. Electronically Signed   By: Elgie Collard M.D.   On: 10/04/2015 02:43    EKG: NSR, LVH   Cardiac Studies: Echo 01/09/15 Study Conclusions - Left ventricle: The cavity size was mildly dilated. Wall thickness was increased in a pattern of moderate LVH. Systolic function was moderately reduced. The estimated ejection fraction was in the range of 35% to 40%. Diffuse hypokinesis. The study is not technically sufficient to allow evaluation of LV diastolic function. - Mitral valve: There was moderate regurgitation. - Left atrium: The atrium was mildly dilated. - Right atrium: The atrium was mildly dilated. - Tricuspid valve: There was moderate regurgitation. - Pulmonary arteries: PA peak pressure: 59 mm Hg (S).  ASSESSMENT AND PLAN:  Austin Mccarty is a 50yo man with PMHx of HTN, chronic systolic HF (EF 35-40%), and CKD stage 2 who presented with dry cough, worsening SOB, and leg swelling likely secondary to acute on chronic systolic CHF, complicated by multifocal pneumonia.   Acute on Chronic Systolic CHF: Although his BNP is much lower than previously, he has significant bilateral lower extremity edema that seems consistent with volume overload due to his heart failure. Additionally, he states his normal weight is 245 lbs. Per review of records, his lowest weight recorded is  253 lbs. I think he still needs some diuresis. Would recommend doing Lasix 80 mg IV BID for today and reassess his volume status tomorrow. His cough could be related to his ACE-inhibitor. Will switch him to Losartan to see if this helps.  - Start Lasix 80 mg IV BID - Monitor UOP, daily weights - Will change Lisinopril to Losartan 100 mg daily  - Add on Spironolactone given he is mildly hypokalemic and has NYHA Class III symptoms - Agree with repeat Echo - Will check HbA1c (mildly elevated at 6.3  in 2011), lipid panel - He will need outpatient follow up with Cardiology, we can set up  Multifocal PNA: His CT Chest is impressive for a multifocal pneumonia. However, he has remained afebrile and had no leukocytosis. Possible that this could represent a viral pneumonitis. Would continue antibiotics for now. Follow up blood cx, resp cx.   HTN: BPs mildly elevated in 150s-170s. Hopefully will come down with diuresis. Will switch him to Losartan as above. Continue other meds.   CKD Stage 2: Cr 1.4 on admission, has remained stable. His baseline Cr has ranged from 1.1-1.4. Renal US in April 2016 consistent with medical renal disease. Continue to monitor Cr and UOP trends.    Attending note to follow.   Signed, Austin Number, MD, MPH Internal Medicine Resident, PGY-II Pager: [phone] 10/28/2015, 10:45 AM   Patient seen, examined. Available data reviewed. Agree with findings, assessment, and plan as outlined by Dr Beckie Salts. The patient's exam shows a very pleasant gentleman in no distress. Jugular venous pressure is moderately elevated in a sitting position, lung fields show fine rales, heart is regular rate and rhythm without murmur or gallop, extremities have 2+ edema to the knees bilaterally laboratory data is reviewed with elevated BNP. The patient's weight is elevated. He has acute on chronic systolic heart failure. We discussed dietary modification with sodium restriction, importance of medication  adherence, and outpatient heart failure recommendations will daily weights, etc. To treat his volume overload, he will receive Lasix 80 mg IV twice daily. We will add spironolactone to his regimen. We'll try to simplify his antihypertensive regimen as well. Will follow with you. Otherwise as outlined above.   Tonny Bollman, M.D. 10/28/2015 2:57 PM

## 2015-10-28 NOTE — Research (Signed)
Reds'@Discharge'$  Study Informed Consent   Subject Name: Austin Mccarty  Subject met inclusion and exclusion criteria.  The informed consent form, study requirements and expectations were reviewed with the subject and questions and concerns were addressed prior to the signing of the consent form.  The subject verbalized understanding of the trail requirements.  The subject agreed to participate in the REDS'@Discharge'$  trial and signed the informed consent.  The informed consent was obtained prior to performance of any protocol-specific procedures for the subject.  A copy of the signed informed consent was given to the subject and a copy was placed in the subject's medical record.  Sandie Ano 10/28/2015, 11:17 AM

## 2015-10-28 NOTE — Hospital Discharge Follow-Up (Signed)
Transitional Care Clinic at [place]:  Patient known to [facility] and [facility]. PCP: Dr. Jarold Song. Spoke with Olga Coaster, RN CM, and it was determined patient may benefit from follow-up with the [facility] after discharge. This Case Manager met with patient to inform patient of the medical management and case management services provided at the [facility] and [facility]. Patient appreciative of information, but not interested in Transitional Care follow-up after discharge. Patient indicated he prefers to follow-up with Dr. Jarold Song as needed. If needed, this Case Manager can assist  with scheduling hospital follow-up appointment at [place] once discharge date known. Olga Coaster, RN CM updated.

## 2015-10-28 NOTE — Progress Notes (Signed)
Pharmacy Antibiotic Note  Austin Mccarty is a 50 y.o. male admitted on 10/27/2015. Pharmacy has been consulted to start antibiotics for possible HCAP. WBC wnl, afebrile, SCr 1.1 > 1.47.   Plan: -Vancomycin 2 g IV x1 then 750/12h -Cefepime 2 g IV q12h -Monitor renal fx, cultures, duration of therapy  -VT as needed   Height:  (185.4 cm) Weight: 256 lb 9.6 oz (116.393 kg) (scale C) IBW/kg (Calculated) : 79.9  Temp (24hrs), Avg:98.1 F (36.7 C), Min:97.7 F (36.5 C), Max:98.4 F (36.9 C)   Recent Labs Lab 10/27/15 1742 10/27/15 2212 10/28/15 0037 10/28/15 0246  WBC 8.5  --  8.6 8.7  CREATININE 1.46* 1.38* 1.44* 1.47*    Estimated Creatinine Clearance: 83 mL/min (by C-G formula based on Cr of 1.47).    Allergies  Allergen Reactions  . Ibuprofen Itching and Swelling    Antimicrobials this admission:  2/9 ceftriaxone x1 2/9 doxy x1 2/9 vanc > 2/9 cefepime >  Microbiology results:  2/9 blood cx: 2/9 sputum:  Thank you for allowing pharmacy to be a part of this patient's care.  Austin Mccarty 10/28/2015 11:12 AM

## 2015-10-29 ENCOUNTER — Inpatient Hospital Stay (HOSPITAL_COMMUNITY): Payer: Medicaid Other

## 2015-10-29 DIAGNOSIS — I5021 Acute systolic (congestive) heart failure: Secondary | ICD-10-CM

## 2015-10-29 DIAGNOSIS — R0600 Dyspnea, unspecified: Secondary | ICD-10-CM

## 2015-10-29 DIAGNOSIS — J189 Pneumonia, unspecified organism: Secondary | ICD-10-CM

## 2015-10-29 DIAGNOSIS — E876 Hypokalemia: Secondary | ICD-10-CM

## 2015-10-29 DIAGNOSIS — R0609 Other forms of dyspnea: Secondary | ICD-10-CM

## 2015-10-29 DIAGNOSIS — D649 Anemia, unspecified: Secondary | ICD-10-CM

## 2015-10-29 DIAGNOSIS — E871 Hypo-osmolality and hyponatremia: Secondary | ICD-10-CM

## 2015-10-29 DIAGNOSIS — R609 Edema, unspecified: Secondary | ICD-10-CM | POA: Insufficient documentation

## 2015-10-29 DIAGNOSIS — R600 Localized edema: Secondary | ICD-10-CM | POA: Insufficient documentation

## 2015-10-29 DIAGNOSIS — R059 Cough, unspecified: Secondary | ICD-10-CM | POA: Insufficient documentation

## 2015-10-29 DIAGNOSIS — R05 Cough: Secondary | ICD-10-CM

## 2015-10-29 LAB — CBC WITH DIFFERENTIAL/PLATELET
BASOS PCT: 1 %
Basophils Absolute: 0.1 10*3/uL (ref 0.0–0.1)
EOS PCT: 3 %
Eosinophils Absolute: 0.2 10*3/uL (ref 0.0–0.7)
HCT: 32.4 % — ABNORMAL LOW (ref 39.0–52.0)
Hemoglobin: 10.3 g/dL — ABNORMAL LOW (ref 13.0–17.0)
LYMPHS ABS: 1.2 10*3/uL (ref 0.7–4.0)
Lymphocytes Relative: 19 %
MCH: 21.9 pg — AB (ref 26.0–34.0)
MCHC: 31.8 g/dL (ref 30.0–36.0)
MCV: 68.9 fL — AB (ref 78.0–100.0)
MONO ABS: 0.6 10*3/uL (ref 0.1–1.0)
Monocytes Relative: 10 %
Neutro Abs: 4.3 10*3/uL (ref 1.7–7.7)
Neutrophils Relative %: 67 %
PLATELETS: 360 10*3/uL (ref 150–400)
RBC: 4.7 MIL/uL (ref 4.22–5.81)
RDW: 16.1 % — AB (ref 11.5–15.5)
WBC: 6.4 10*3/uL (ref 4.0–10.5)

## 2015-10-29 LAB — BASIC METABOLIC PANEL
Anion gap: 10 (ref 5–15)
BUN: 15 mg/dL (ref 6–20)
CALCIUM: 8.3 mg/dL — AB (ref 8.9–10.3)
CHLORIDE: 93 mmol/L — AB (ref 101–111)
CO2: 29 mmol/L (ref 22–32)
CREATININE: 1.33 mg/dL — AB (ref 0.61–1.24)
GFR calc Af Amer: >60 mL/min (ref >60)
GFR calc non Af Amer: >60 mL/min (ref >60)
Glucose, Bld: 136 mg/dL — ABNORMAL HIGH (ref 65–99)
Potassium: 3.3 mmol/L — ABNORMAL LOW (ref 3.5–5.1)
SODIUM: 132 mmol/L — AB (ref 135–145)

## 2015-10-29 LAB — TROPONIN I
Troponin I: <0.03 ng/mL (ref <0.031)
Troponin I: <0.03 ng/mL (ref <0.031)
Troponin I: <0.03 ng/mL (ref <0.031)
Troponin I: <0.03 ng/mL (ref <0.031)

## 2015-10-29 LAB — LEGIONELLA ANTIGEN, URINE

## 2015-10-29 LAB — LIPID PANEL
CHOL/HDL RATIO: 3.2 ratio
Cholesterol: 109 mg/dL (ref 0–200)
HDL: 34 mg/dL — ABNORMAL LOW (ref >40)
LDL CALC: 63 mg/dL (ref 0–99)
Triglycerides: 58 mg/dL (ref <150)
VLDL: 12 mg/dL (ref 0–40)

## 2015-10-29 NOTE — Progress Notes (Signed)
Heart Failure Navigator Consult Note  Presentation: Austin Mccarty is a 50 y.o. male with a past medical history of chronic systolic CHF, hypertension, stage II CKD, recently admitted and discharged for community-acquired pneumonia comes to the emergency department due to progressively worse dyspnea and lower extremity edema for about a week.  Per patient, he was admitted and discharge for community-acquired pneumonia last month. He states that he was doing well post discharge, but for the past 2 weeks started noticing increased lower extremity edema. Since about a week ago, he started noticing progressively worse dyspnea and had his furosemide dose 4 days ago. He denies chest pain, palpitations, orthopnea or PND, but complains of occasional positional dizziness. He also complains of persistent episodes of coughing, wheezing, subjective fevers, chills, fatigue, malaise and loss of appetite.   When seen, the patient stated he was feeling better with supplemental oxygen. He was in no acute distress.  Past Medical History  Diagnosis Date  . CKD (chronic kidney disease) stage 2, GFR 60-89 ml/min   . CAP (community acquired pneumonia) 01/08/2015  . Foreign body, eye 2009  . Hypertension     hx/pt 01/08/2015  . Chronic systolic (congestive) heart failure [facility])     Social History   Social History  . Marital Status: Married    Spouse Name: N/A  . Number of Children: N/A  . Years of Education: N/A   Social History Main Topics  . Smoking status: Former Smoker -- 1.00 packs/day for 8 years    Types: Cigarettes    Quit date: 10/25/2007  . Smokeless tobacco: Never Used     Comment: "quit smoking in ~ 2009  . Alcohol Use: No  . Drug Use: No  . Sexual Activity: Yes   Other Topics Concern  . None   Social History Narrative    ECHO:Study Conclusions--01/09/15  - Left ventricle: The cavity size was mildly dilated. Wall thickness was increased in a pattern of moderate LVH.  Systolic function was moderately reduced. The estimated ejection fraction was in the range of 35% to 40%. Diffuse hypokinesis. The study is not technically sufficient to allow evaluation of LV diastolic function. - Mitral valve: There was moderate regurgitation. - Left atrium: The atrium was mildly dilated. - Right atrium: The atrium was mildly dilated. - Tricuspid valve: There was moderate regurgitation. - Pulmonary arteries: PA peak pressure: 59 mm Hg (S).  Impressions:  - Global L. Strain=-12.9.  Transthoracic echocardiography. M-mode, complete 2D, spectral Doppler, and color Doppler. Birthdate: Patient birthdate: Jan 22, 1966. Age: Patient is 50 yr old. Sex: Gender: male. BMI: 34.7 kg/m^2. Blood pressure:   164/77 Patient status: Inpatient. Study date: Study date: 01/09/2015. Study time: 03:13 PM. Location: Bedside.  BNP    Component Value Date/Time   BNP 582.5* 10/27/2015 2027    ProBNP    Component Value Date/Time   PROBNP 2840.00* 01/12/2015 1213     Education Assessment and Provision:  Detailed education and instructions provided on heart failure disease management including the following:  Signs and symptoms of Heart Failure When to call the physician Importance of daily weights Low sodium diet Fluid restriction Medication management Anticipated future follow-up appointments  Patient education given on each of the above topics.  Patient acknowledges understanding and acceptance of all instructions.  I spoke with Mr. Austin Mccarty regarding his HF.  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  We discussed the importance of daily weights and how they relate to the signs and  symptoms of HF.  He does say that he can take the scale with him as he travels and weigh on the road.  I also briefly described a low sodium diet and high sodium foods to avoid.  He tells me that he is able to bring food on the road and does not always eat out or  fast food.  He denies any issues getting or taking prescribed medications--however he does say that he is unsure if he has Medicaid coverage yet as he applied last admission.  He was following with Dr. Daleen Squibb at [facility] and since he left has only been seeing a PCP at [facility].  He is followed here (in hosp) by [facility] and will likely have hospital follow-up there as well upon discharge.  Education Materials:  "Living Better With Heart Failure" Booklet, Daily Weight Tracker Tool    High Risk Criteria for Readmission and/or Poor Patient Outcomes:  (Recommend Follow-up with [facility])--  he would benefit from referral to the A[facility] Clinic for follow-up   EF <30%- No 35-40%  2 or more admissions in 6 months- yes 2/58mo  Difficult social situation- No-however ? Medicaid  Demonstrates medication noncompliance- denies    Barriers of Care:  Knowledge, compliance and financial constraints  Discharge Planning:   Plans to return home with his family here in [place]

## 2015-10-29 NOTE — Progress Notes (Signed)
Echocardiogram 2D Echocardiogram has been performed.  Austin Mccarty 10/29/2015, 1:58 PM

## 2015-10-29 NOTE — Progress Notes (Signed)
[facility]                                                                              Patient Demographics  Austin Mccarty, is a 50 y.o. male, DOB - Jun 17, 1966, YNW:[id number]  Admit date - 10/27/2015   Admitting Physician Bobette Mo, MD  Outpatient Primary MD for the patient is [name], MD  LOS - 1   Chief Complaint  Patient presents with  . Shortness of Breath  . Leg Swelling      HPI on 10/27/2015 by Dr. Sanda Klein Austin Mccarty is a 50 y.o. male with a past medical history of chronic systolic CHF, hypertension, stage II CKD, recently admitted and discharged for community-acquired pneumonia comes to the emergency department due to progressively worse dyspnea and lower extremity edema for about a week.  Per patient, he was admitted and discharge for community-acquired pneumonia last month. He states that he was doing well post discharge, but for the past 2 weeks started noticing increased lower extremity edema. Since about a week ago, he started noticing progressively worse dyspnea and had his furosemide dose 4 days ago. He denies chest pain, palpitations, orthopnea or PND, but complains of occasional positional dizziness. He also complains of persistent episodes of coughing, wheezing, subjective fevers, chills, fatigue, malaise and loss of appetite.   When seen, the patient stated he was feeling better with supplemental oxygen. He was in no acute distress.  Assessment & Plan   Acute hypoxic respiratory failure -Likely multifactorial including multifocal pneumonia and heart failure exacerbation -CT chest shows diffuse displaced and nodular opacities, degree of congestive changes -Patient currently maintaining oxygen saturations in the high 90s on room air  Acute on chronic systolic heart failure exacerbation -BNP 582.5 -Continue IV Lasix, spironolactone, losartan, Imdur, Coreg, hydralazine -Echocardiogram pending -Echocardiogram in 2016 showed an EF  of 35-40% with diffuse hypokinesis -Cardiology consulted and appreciated -Continue to monitor intake and output, daily weights -Urine output over the last 24 hours 2550cc  Multifocal pneumonia -CT chest showed diffuse multifocal pneumonia with interval improvement -Continue vancomycin and cefepime -Influenza panel -Strep pneumonia urine antigen -Patient is experiencing cough possibly secondary to ACE inhibitor, switched to ARB  Essential hypertension -Continue his prophylactic tone, Lasix, losartan, Coreg, hydralazine  Hyponatremia, chronic -Baseline appears to be in the 133-135 range, currently 132 -Continue to monitor BMP   Chronic kidney disease, stage II -Ultrasound April 2016 consistent with medical renal disease -Continue to monitor BMP closely as patient is being diuresed  Hypokalemia -Secondary to diuresis -Continue to replace and monitor BMP  Chronic microcytic Anemia -Baseline hemoglobin approximately 10, currently 10.4 -Continue to monitor CBC   Hyperglycemia -Hemoglobin A1c pending  Code Status: Full  Family Communication: None at bedside.  Disposition Plan: Admitted  Time Spent in minutes   30 minutes  Procedures  Echocardiogram  Consults   Cardiology  DVT Prophylaxis  Lovenox  Lab Results  Component Value Date   PLT 360 10/29/2015    Medications  Scheduled Meds: . carvedilol  12.5 mg Oral BID WC  . ceFEPime (MAXIPIME) IV  2 g Intravenous Q12H  . enoxaparin (LOVENOX) injection  60 mg Subcutaneous QHS  .  furosemide  80 mg Intravenous BID  . hydrALAZINE  25 mg Oral TID  . isosorbide mononitrate  30 mg Oral Daily  . losartan  100 mg Oral Daily  . potassium chloride SA  20 mEq Oral Daily  . sodium chloride flush  3 mL Intravenous Q12H  . spironolactone  12.5 mg Oral Daily  . vancomycin  750 mg Intravenous Q12H   Continuous Infusions:  PRN Meds:.ipratropium-albuterol  Antibiotics    Anti-infectives    Start     Dose/Rate Route  Frequency Ordered Stop   10/29/15 0000  ceFEPIme (MAXIPIME) 2 g in dextrose 5 % 50 mL IVPB     2 g 100 mL/hr over 30 Minutes Intravenous Every 12 hours 10/28/15 1144     10/28/15 2200  vancomycin (VANCOCIN) IVPB 750 mg/150 ml premix     750 mg 150 mL/hr over 60 Minutes Intravenous Every 12 hours 10/28/15 1144     10/28/15 1200  ceFEPIme (MAXIPIME) 2 g in dextrose 5 % 50 mL IVPB     2 g 100 mL/hr over 30 Minutes Intravenous  Once 10/28/15 0850 10/28/15 1237   10/28/15 0930  vancomycin (VANCOCIN) 2,000 mg in sodium chloride 0.9 % 500 mL IVPB     2,000 mg 250 mL/hr over 120 Minutes Intravenous  Once 10/28/15 0850 10/28/15 1155   10/28/15 0130  cefTRIAXone (ROCEPHIN) 1 g in dextrose 5 % 50 mL IVPB  Status:  Discontinued     1 g 100 mL/hr over 30 Minutes Intravenous Daily at bedtime 10/28/15 0103 10/28/15 0846   10/28/15 0115  doxycycline (VIBRA-TABS) tablet 100 mg  Status:  Discontinued     100 mg Oral Every 12 hours 10/28/15 0103 10/28/15 0846        Subjective:   Austin Mccarty seen and examined today.  Patient states his shortness of breath has resolved. He denies any chest pain, palpitations, dizziness, headache, abdominal pain.  Objective:   Filed Vitals:   10/29/15 0400 10/29/15 0653 10/29/15 0923 10/29/15 1253  BP:  151/84 149/83 151/78  Pulse:  96 93 87  Temp:  98.6 F (37 C) 97.8 F (36.6 C)   TempSrc:  Oral Oral   Resp:  Height:      Weight: 115.849 kg (255 lb 6.4 oz)     SpO2:  92% 93% 97%    Wt Readings from Last 3 Encounters:  10/29/15 115.849 kg (255 lb 6.4 oz)  10/27/15 118.752 kg (261 lb 12.8 oz)  10/25/15 119.296 kg (263 lb)     Intake/Output Summary (Last 24 hours) at 10/29/15 1439 Last data filed at 10/29/15 1421  Gross per 24 hour  Intake   1160 ml  Output   4050 ml  Net  -2890 ml    Exam  General: Well developed, well nourished, NAD, appears stated age  HEENT: NCAT, mucous membranes moist.   Neck: Supple, + JVD, no  masses  Cardiovascular: S1 S2 auscultated, no murmurs, RRR  Respiratory: Faint bibasilar rales  Abdomen: Soft, nontender, nondistended, + bowel sounds  Extremities: warm dry without cyanosis clubbing. 2+LE edema B/L  Neuro: AAOx3, nonfocal  Psych: Normal affect and demeanor     Data Review   Micro Results Recent Results (from the past 240 hour(s))  Culture, blood (routine x 2) Call MD if unable to obtain prior to antibiotics being given     Status: None (Preliminary result)   Collection Time: 10/28/15  2:46 AM  Result Value  Ref Range Status   Specimen Description BLOOD LEFT ARM  Final   Special Requests BOTTLES DRAWN AEROBIC AND ANAEROBIC 10CC   Final   Culture NO GROWTH 1 DAY  Final   Report Status PENDING  Incomplete  Culture, blood (routine x 2) Call MD if unable to obtain prior to antibiotics being given     Status: None (Preliminary result)   Collection Time: 10/28/15  2:51 AM  Result Value Ref Range Status   Specimen Description BLOOD LEFT HAND  Final   Special Requests BOTTLES DRAWN AEROBIC AND ANAEROBIC 10CC  Final   Culture NO GROWTH 1 DAY  Final   Report Status PENDING  Incomplete    Radiology Reports Dg Chest 2 View  10/27/2015  CLINICAL DATA:  Bilateral pedal edema, cough, shortness of breath EXAM: CHEST  2 VIEW COMPARISON:  CT chest 10/04/2015 FINDINGS: There is bilateral interstitial and alveolar airspace opacities. There is no pleural effusion or pneumothorax. There is stable cardiomegaly. There is no acute osseous abnormality. IMPRESSION: Findings concerning for pulmonary edema versus multilobar pneumonia. Electronically Signed   By: Elige Ko   On: 10/27/2015 18:28   Dg Chest 2 View  10/03/2015  CLINICAL DATA:  Shortness of breath and lower extremity edema. Chronic renal failure EXAM: CHEST  2 VIEW COMPARISON:  September 29, 2015 FINDINGS: There is widespread interstitial and alveolar opacity throughout the lungs, stable. There is cardiomegaly with pulmonary  venous hypertension. No adenopathy. No appreciable effusions. No demonstrable bone lesions. IMPRESSION: Widespread interstitial and alveolar opacity bilaterally with cardiomegaly and pulmonary venous hypertension. Differential considerations for this extensive abnormality include congestive heart failure. Widespread pneumonia, Goodpasture's disease with hemorrhage, hypersensitivity pneumonitis, and ARDS. More than 1 of these entities may well exist concurrently. The appearance is stable compared to recent prior study. Electronically Signed   By: Bretta Bang III M.D.   On: 10/03/2015 20:39   Dg Chest 2 View  09/29/2015  CLINICAL DATA:  Cough and fever or 1 week EXAM: CHEST - 2 VIEW COMPARISON:  01/08/2015 FINDINGS: Cardiac shadow is mildly prominent but stable in appearance from the prior exam. There again noted bilateral multifocal infiltrative densities right slightly worse than left. They have is increased slightly in the interval from the prior exam. IMPRESSION: Recurrent bilateral infiltrates right greater than left. Although pulmonary edema deserves consideration these likely represent a multifocal infectious etiology. Electronically Signed   By: Alcide Clever M.D.   On: 09/29/2015 16:24   Ct Chest Wo Contrast  10/28/2015  CLINICAL DATA:  50 year old male with shortness of breath AP EXAM: CT CHEST WITHOUT CONTRAST TECHNIQUE: Multidetector CT imaging of the chest was performed following the standard protocol without IV contrast. COMPARISON:  Radiograph dated 10/27/2015 FINDINGS: Evaluation of this exam is limited in the absence of intravenous contrast. There bilateral multi focal airspace ground-glass opacities as well as areas of nodularity most compatible with multifocal pneumonia. The degree of congestive changes may be present. Overall the appearance of the ovary densities have improved compared to the prior study. There is no pleural effusion or pneumothorax. The central airways are patent. The  thoracic aorta and central pulmonary arteries are grossly unremarkable on this noncontrast study. Top-normal cardiac size. There is hypoattenuation of the cardiac blood pool suggestive of a degree of anemia. Clinical correlation is recommended. There mediastinal adenopathy most prominent involving the prevascular and right paratracheal space. The esophagus is grossly unremarkable. There is no axillary adenopathy. There is diffuse subcutaneous soft tissue stranding of the  thoracic wall. Bold right posterior rib fractures noted. No acute fracture. The visualized upper abdomen is grossly unremarkable. IMPRESSION: Diffuse displaced and nodular opacities concerning for multifocal pneumonia. The overall there has been interval improvement in the pulmonary opacities compared to the prior study. A degree of congestive changes is likely present as well. Correlation with clinical exam recommended. Electronically Signed   By: Elgie Collard M.D.   On: 10/28/2015 00:32   Ct Chest Wo Contrast  10/04/2015  CLINICAL DATA:  50 year old male with shortness of breath EXAM: CT CHEST WITHOUT CONTRAST TECHNIQUE: Multidetector CT imaging of the chest was performed following the standard protocol without IV contrast. COMPARISON:  Chest radiograph dated 10/03/2015 and CT dated 01/08/2015 FINDINGS: Evaluation of this exam is limited in the absence of intravenous contrast. There is multi focal airspace opacities involving upper and lower lobes bilaterally with areas of air bronchogram most compatible with multifocal pneumonia. Pulmonary edema is not excluded. Clinical correlation is recommended. There is no pleural effusion or pneumothorax. The central airways are patent. The thoracic aorta and pulmonary arteries are grossly unremarkable on this noncontrast study. There is no cardiomegaly or pericardial effusion. There is mild approximation of the cardiac blood pool suggestive of a degree of anemia. Mildly enlarged paratracheal and  mediastinal adenopathy. The esophagus is grossly unremarkable. No thyroid nodule identified. The chest wall soft tissues appear unremarkable. Mild degenerative changes of the spine. Old right seventh rib fracture. No acute fracture. The visualized upper abdomen appears unremarkable. IMPRESSION: Bilateral airspace opacity most compatible with multifocal pneumonia. Underlying edema is not excluded. Clinical correlation and follow-up recommended. Electronically Signed   By: Elgie Collard M.D.   On: 10/04/2015 02:43    CBC  Recent Labs Lab 10/27/15 1742 10/28/15 0037 10/28/15 0246 10/29/15 0420  WBC 8.5 8.6 8.7 6.4  HGB 9.8* 10.4* 10.4* 10.3*  HCT 31.0* 31.1* 31.4* 32.4*  PLT 308 339 354 360  MCV 68.9* 68.7* 68.7* 68.9*  MCH 21.8* 23.0* 22.8* 21.9*  MCHC 31.6 33.4 33.1 31.8  RDW 15.9* 15.9* 16.1* 16.1*  LYMPHSABS  --   --  1.3 1.2  MONOABS  --   --  0.8 0.6  EOSABS  --   --  0.2 0.2  BASOSABS  --   --  0.0 0.1    Chemistries   Recent Labs Lab 10/27/15 1742 10/27/15 2212 10/28/15 0037 10/28/15 0246 10/29/15 0420  NA 132* 133*  --  131* 132*  K 3.9 3.6  --  3.4* 3.3*  CL 95* 96*  --  96* 93*  CO2 26 26  --  25 29  GLUCOSE 129* 99  --  179* 136*  BUN 17 16  --  17 15  CREATININE 1.46* 1.38* 1.44* 1.47* 1.33*  CALCIUM 8.3* 8.4*  --  8.2* 8.3*  MG  --   --  1.7  --   --   AST  --  19  --  18  --   ALT  --  23  --  24  --   ALKPHOS  --  65  --  62  --   BILITOT  --  0.6  --  0.5  --    ------------------------------------------------------------------------------------------------------------------ estimated creatinine clearance is 91.6 mL/min (by C-G formula based on Cr of 1.33). ------------------------------------------------------------------------------------------------------------------ No results for input(s): HGBA1C in the last 72 hours. ------------------------------------------------------------------------------------------------------------------  Recent  Labs  10/29/15 0420  CHOL 109  HDL 34*  LDLCALC 63  TRIG 58  CHOLHDL 3.2   ------------------------------------------------------------------------------------------------------------------  No results for input(s): TSH, T4TOTAL, T3FREE, THYROIDAB in the last 72 hours.  Invalid input(s): FREET3 ------------------------------------------------------------------------------------------------------------------  Recent Labs  10/28/15 1438  VITAMINB12 222  FOLATE 7.8  FERRITIN 190  TIBC 245*  IRON 29*  RETICCTPCT 2.8    Coagulation profile No results for input(s): INR, PROTIME in the last 168 hours.  No results for input(s): DDIMER in the last 72 hours.  Cardiac Enzymes  Recent Labs Lab 10/28/15 2107 10/29/15 0430 10/29/15 0941  TROPONINI <0.03 <0.03 <0.03   ------------------------------------------------------------------------------------------------------------------ Invalid input(s): POCBNP    Shaylon Aden D.O. on 10/29/2015 at 2:39 PM  Between 7am to 7pm - Pager - [phone]  After 7pm go to [email or web address] - password [email or web address]  And look for the night coverage person covering for me after hours  [facility] Office  [phone]

## 2015-10-29 NOTE — Progress Notes (Signed)
Patient Name: Austin Mccarty Date of Encounter: 10/29/2015   SUBJECTIVE  Feeling excellent. Resolved SOB. No chest pain, palpitations or dizziness.   CURRENT MEDS . carvedilol  12.5 mg Oral BID WC  . ceFEPime (MAXIPIME) IV  2 g Intravenous Q12H  . enoxaparin (LOVENOX) injection  60 mg Subcutaneous QHS  . furosemide  80 mg Intravenous BID  . hydrALAZINE  25 mg Oral TID  . isosorbide mononitrate  30 mg Oral Daily  . losartan  100 mg Oral Daily  . potassium chloride SA  20 mEq Oral Daily  . sodium chloride flush  3 mL Intravenous Q12H  . spironolactone  12.5 mg Oral Daily  . vancomycin  750 mg Intravenous Q12H    OBJECTIVE  Filed Vitals:   10/28/15 2030 10/29/15 0022 10/29/15 0400 10/29/15 0653  BP: 156/84 169/88  151/84  Pulse: 95 94  96  Temp: 98.2 F (36.8 C) 98.2 F (36.8 C)  98.6 F (37 C)  TempSrc: Oral Oral  Oral  Resp: Height:      Weight:   255 lb 6.4 oz (115.849 kg)   SpO2: 99% 99%  92%    Intake/Output Summary (Last 24 hours) at 10/29/15 1051 Last data filed at 10/29/15 1016  Gross per 24 hour  Intake   1090 ml  Output   2300 ml  Net  -1210 ml   Filed Weights   10/27/15 2314 10/28/15 0305 10/29/15 0400  Weight: 259 lb 12.8 oz (117.845 kg) 256 lb 9.6 oz (116.393 kg) 255 lb 6.4 oz (115.849 kg)    PHYSICAL EXAM  General: Pleasant male in NAD. Sitting in chair.  Neuro: Alert and oriented X 3. Moves all extremities spontaneously. Psych: Normal affect. HEENT:  Normal  Neck: Supple without bruits. + JVD Lungs:  Resp regular and unlabored. Faint bibasilar rales Heart: RRR no s3, s4, or murmurs. Abdomen: Soft, non-tender, non-distended, BS + x 4.  Extremities: No clubbing, cyanosis. 2+ BL LE edema. DP 2+ and equal bilaterally.  Accessory Clinical Findings  CBC  Recent Labs  10/28/15 0246 10/29/15 0420  WBC 8.7 6.4  NEUTROABS 6.4 4.3  HGB 10.4* 10.3*  HCT 31.4* 32.4*  MCV 68.7* 68.9*  PLT 354 360   Basic Metabolic  Panel  Recent Labs  16/10/96 0037 10/28/15 0246 10/29/15 0420  NA  --  131* 132*  K  --  3.4* 3.3*  CL  --  96* 93*  CO2  --  25 29  GLUCOSE  --  179* 136*  BUN  --  17 15  CREATININE 1.44* 1.47* 1.33*  CALCIUM  --  8.2* 8.3*  MG 1.7  --   --   PHOS 3.5  --   --    Liver Function Tests  Recent Labs  10/27/15 2212 10/28/15 0246  AST 19 18  ALT 23 24  ALKPHOS 65 62  BILITOT 0.6 0.5  PROT 7.2 6.8  ALBUMIN 2.0* 2.1*   No results for input(s): LIPASE, AMYLASE in the last 72 hours. Cardiac Enzymes  Recent Labs  10/28/15 1438 10/28/15 2107 10/29/15 0430  TROPONINI 0.25* <0.03 <0.03  Fasting Lipid Panel  Recent Labs  10/29/15 0420  CHOL 109  HDL 34*  LDLCALC 63  TRIG 58  CHOLHDL 3.2    TELE  Sinus rhythm with PACs. Rate mostly in 80s. Transiently in high 30s to low 40s.   Radiology/Studies  Dg Chest 2 View  10/27/2015  CLINICAL DATA:  Bilateral pedal edema, cough, shortness of breath EXAM: CHEST  2 VIEW COMPARISON:  CT chest 10/04/2015 FINDINGS: There is bilateral interstitial and alveolar airspace opacities. There is no pleural effusion or pneumothorax. There is stable cardiomegaly. There is no acute osseous abnormality. IMPRESSION: Findings concerning for pulmonary edema versus multilobar pneumonia. Electronically Signed   By: Elige Ko   On: 10/27/2015 18:28   Dg Chest 2 View  10/03/2015  CLINICAL DATA:  Shortness of breath and lower extremity edema. Chronic renal failure EXAM: CHEST  2 VIEW COMPARISON:  September 29, 2015 FINDINGS: There is widespread interstitial and alveolar opacity throughout the lungs, stable. There is cardiomegaly with pulmonary venous hypertension. No adenopathy. No appreciable effusions. No demonstrable bone lesions. IMPRESSION: Widespread interstitial and alveolar opacity bilaterally with cardiomegaly and pulmonary venous hypertension. Differential considerations for this extensive abnormality include congestive heart failure.  Widespread pneumonia, Goodpasture's disease with hemorrhage, hypersensitivity pneumonitis, and ARDS. More than 1 of these entities may well exist concurrently. The appearance is stable compared to recent prior study. Electronically Signed   By: Bretta Bang III M.D.   On: 10/03/2015 20:39   Dg Chest 2 View  09/29/2015  CLINICAL DATA:  Cough and fever or 1 week EXAM: CHEST - 2 VIEW COMPARISON:  01/08/2015 FINDINGS: Cardiac shadow is mildly prominent but stable in appearance from the prior exam. There again noted bilateral multifocal infiltrative densities right slightly worse than left. They have is increased slightly in the interval from the prior exam. IMPRESSION: Recurrent bilateral infiltrates right greater than left. Although pulmonary edema deserves consideration these likely represent a multifocal infectious etiology. Electronically Signed   By: Alcide Clever M.D.   On: 09/29/2015 16:24   Ct Chest Wo Contrast  10/28/2015  CLINICAL DATA:  50 year old male with shortness of breath AP EXAM: CT CHEST WITHOUT CONTRAST TECHNIQUE: Multidetector CT imaging of the chest was performed following the standard protocol without IV contrast. COMPARISON:  Radiograph dated 10/27/2015 FINDINGS: Evaluation of this exam is limited in the absence of intravenous contrast. There bilateral multi focal airspace ground-glass opacities as well as areas of nodularity most compatible with multifocal pneumonia. The degree of congestive changes may be present. Overall the appearance of the ovary densities have improved compared to the prior study. There is no pleural effusion or pneumothorax. The central airways are patent. The thoracic aorta and central pulmonary arteries are grossly unremarkable on this noncontrast study. Top-normal cardiac size. There is hypoattenuation of the cardiac blood pool suggestive of a degree of anemia. Clinical correlation is recommended. There mediastinal adenopathy most prominent involving the  prevascular and right paratracheal space. The esophagus is grossly unremarkable. There is no axillary adenopathy. There is diffuse subcutaneous soft tissue stranding of the thoracic wall. Bold right posterior rib fractures noted. No acute fracture. The visualized upper abdomen is grossly unremarkable. IMPRESSION: Diffuse displaced and nodular opacities concerning for multifocal pneumonia. The overall there has been interval improvement in the pulmonary opacities compared to the prior study. A degree of congestive changes is likely present as well. Correlation with clinical exam recommended. Electronically Signed   By: Elgie Collard M.D.   On: 10/28/2015 00:32   Ct Chest Wo Contrast  10/04/2015  CLINICAL DATA:  50 year old male with shortness of breath EXAM: CT CHEST WITHOUT CONTRAST TECHNIQUE: Multidetector CT imaging of the chest was performed following the standard protocol without IV contrast. COMPARISON:  Chest radiograph dated 10/03/2015 and CT dated 01/08/2015 FINDINGS: Evaluation of this exam is limited  in the absence of intravenous contrast. There is multi focal airspace opacities involving upper and lower lobes bilaterally with areas of air bronchogram most compatible with multifocal pneumonia. Pulmonary edema is not excluded. Clinical correlation is recommended. There is no pleural effusion or pneumothorax. The central airways are patent. The thoracic aorta and pulmonary arteries are grossly unremarkable on this noncontrast study. There is no cardiomegaly or pericardial effusion. There is mild approximation of the cardiac blood pool suggestive of a degree of anemia. Mildly enlarged paratracheal and mediastinal adenopathy. The esophagus is grossly unremarkable. No thyroid nodule identified. The chest wall soft tissues appear unremarkable. Mild degenerative changes of the spine. Old right seventh rib fracture. No acute fracture. The visualized upper abdomen appears unremarkable. IMPRESSION: Bilateral  airspace opacity most compatible with multifocal pneumonia. Underlying edema is not excluded. Clinical correlation and follow-up recommended. Electronically Signed   By: Elgie Collard M.D.   On: 10/04/2015 02:43    ASSESSMENT AND PLAN   Austin Mccarty is a 50yo man with PMHx of HTN, chronic systolic HF (EF 35-40%), and CKD stage 2 who presented with dry cough, worsening SOB, and leg swelling likely secondary to acute on chronic systolic CHF, complicated by multifocal pneumonia.   1. Acute on Chronic Systolic CHF: Although his BNP is much lower than previously, he has significant bilateral lower extremity edema that seems consistent with volume overload due to his heart failure. Additionally, he states his normal weight is 245 lbs. Per review of records, his lowest weight recorded is 253 lbs. Lisinopril switched to losartan due to cough, now resolved.  - Diuresed negative 2.7 L with 7lb weight loss (262-->255). Still significant volume overload on exam. - Continue IV lasix  BId, Spironolactone 12.5mg , losartan , imdur  and coreg 12.5mg  BID and hydralazine  TID.  - Pendign Echo  2. Multifocal PNA: His CT Chest is impressive for a multifocal pneumonia. However, he has remained afebrile and had no leukocytosis. Possible that this could represent a viral pneumonitis. Would continue antibiotics --> per primary. Follow up blood cx, resp cx.   3. HTN:  Still elevated at times .Continue above meds. Titrate as neded.    4. CKD Stage 2. His baseline Cr has ranged from 1.1-1.4. Renal US in April 2016 consistent with medical renal disease. Scr improved to 1.33 from 1.47.  5. Sinus Bradycardia - Tele shows Sinus rhythm with PACs. Rate mostly in 80s. Transiently in high 30s to low 40s.  - Asymptomatic. On Coreg 12.5mg  BID. Reduced dose if needed.   6. Hyperglycemia - Pending A1c  Signed, Bhagat,Bhavinkumar PA-C Pager [phone]  Patient seen, examined. Available data reviewed. Agree with  findings, assessment, and plan as outlined by Chelsea Aus, PA-C. Exam reveals an alert, oriented male in no distress. Lungs are clear. Heart is regular rate and rhythm. Extremities still have to plus edema bilaterally. JVP remains elevated. While he is improving, he is still volume overloaded and I would recommend continuing furosemide current dose as he has diuresing well. Otherwise as outlined above. Anticipate about 48 hours longer for his hospitalization. Echocardiogram is pending.  Tonny Bollman, M.D. 10/29/2015 12:15 PM

## 2015-10-30 LAB — CBC WITH DIFFERENTIAL/PLATELET
BASOS ABS: 0 10*3/uL (ref 0.0–0.1)
Basophils Relative: 0 %
Eosinophils Absolute: 0.2 10*3/uL (ref 0.0–0.7)
Eosinophils Relative: 3 %
HCT: 32.5 % — ABNORMAL LOW (ref 39.0–52.0)
Hemoglobin: 10.2 g/dL — ABNORMAL LOW (ref 13.0–17.0)
LYMPHS PCT: 19 %
Lymphs Abs: 1.2 10*3/uL (ref 0.7–4.0)
MCH: 21.6 pg — AB (ref 26.0–34.0)
MCHC: 31.4 g/dL (ref 30.0–36.0)
MCV: 68.9 fL — AB (ref 78.0–100.0)
MONOS PCT: 10 %
Monocytes Absolute: 0.6 10*3/uL (ref 0.1–1.0)
NEUTROS ABS: 4.3 10*3/uL (ref 1.7–7.7)
Neutrophils Relative %: 68 %
Platelets: 347 10*3/uL (ref 150–400)
RBC: 4.72 MIL/uL (ref 4.22–5.81)
RDW: 15.9 % — ABNORMAL HIGH (ref 11.5–15.5)
WBC: 6.3 10*3/uL (ref 4.0–10.5)

## 2015-10-30 LAB — TROPONIN I
Troponin I: <0.03 ng/mL (ref <0.031)
Troponin I: <0.03 ng/mL (ref <0.031)
Troponin I: <0.03 ng/mL (ref <0.031)

## 2015-10-30 LAB — BASIC METABOLIC PANEL
ANION GAP: 9 (ref 5–15)
BUN: 14 mg/dL (ref 6–20)
CO2: 29 mmol/L (ref 22–32)
Calcium: 8.3 mg/dL — ABNORMAL LOW (ref 8.9–10.3)
Chloride: 93 mmol/L — ABNORMAL LOW (ref 101–111)
Creatinine, Ser: 1.39 mg/dL — ABNORMAL HIGH (ref 0.61–1.24)
GFR calc Af Amer: >60 mL/min (ref >60)
GFR, EST NON AFRICAN AMERICAN: 59 mL/min — AB (ref >60)
GLUCOSE: 150 mg/dL — AB (ref 65–99)
POTASSIUM: 3.6 mmol/L (ref 3.5–5.1)
Sodium: 131 mmol/L — ABNORMAL LOW (ref 135–145)

## 2015-10-30 LAB — HEMOGLOBIN A1C
Hgb A1c MFr Bld: 6.6 % — ABNORMAL HIGH (ref 4.8–5.6)
Mean Plasma Glucose: 143 mg/dL

## 2015-10-30 MED ORDER — FLUTICASONE PROPIONATE 50 MCG/ACT NA SUSP
1.0000 | Freq: Every day | NASAL | Status: DC
  Administered 2015-10-30: 1 via NASAL
  Filled 2015-10-30: qty 16

## 2015-10-30 NOTE — Progress Notes (Signed)
Clinically improving Cardiology to see as needed over the weekend Please call with questions  Hillis Range MD, [facility] 10/30/2015 2:47 PM

## 2015-10-30 NOTE — Progress Notes (Signed)
[facility]                                                                              Patient Demographics  Austin Mccarty, is a 50 y.o. male, DOB - Sep 23, 1965, ZOX:[id number]  Admit date - 10/27/2015   Admitting Physician Bobette Mo, MD  Outpatient Primary MD for the patient is [name], MD  LOS - 2   Chief Complaint  Patient presents with  . Shortness of Breath  . Leg Swelling      HPI on 10/27/2015 by Dr. Sanda Klein Austin Mccarty is a 50 y.o. male with a past medical history of chronic systolic CHF, hypertension, stage II CKD, recently admitted and discharged for community-acquired pneumonia comes to the emergency department due to progressively worse dyspnea and lower extremity edema for about a week.  Per patient, he was admitted and discharge for community-acquired pneumonia last month. He states that he was doing well post discharge, but for the past 2 weeks started noticing increased lower extremity edema. Since about a week ago, he started noticing progressively worse dyspnea and had his furosemide dose 4 days ago. He denies chest pain, palpitations, orthopnea or PND, but complains of occasional positional dizziness. He also complains of persistent episodes of coughing, wheezing, subjective fevers, chills, fatigue, malaise and loss of appetite.   When seen, the patient stated he was feeling better with supplemental oxygen. He was in no acute distress.  Assessment & Plan   Acute hypoxic respiratory failure -Resolved -Likely multifactorial including multifocal pneumonia and heart failure exacerbation -CT chest shows diffuse displaced and nodular opacities, degree of congestive changes -Patient currently maintaining oxygen saturations in the high 90s on room air  Acute on chronic systolic heart failure exacerbation -BNP 582.5 -Continue IV Lasix, spironolactone, losartan, Imdur, Coreg, hydralazine -Echocardiogram EF 30-35% -Echocardiogram in 2016  showed an EF of 35-40% with diffuse hypokinesis -Cardiology consulted and appreciated -Continue to monitor intake and output, daily weights -Urine output over the last 24 hours 2451cc -LE edema improving  Multifocal pneumonia -CT chest showed diffuse multifocal pneumonia with interval improvement -Continue vancomycin and cefepime -Influenza panel negative -Strep pneumonia and legionella urine antigens negative -Patient is experiencing cough possibly secondary to ACE inhibitor, switched to ARB  Essential hypertension -Continue spironolactone, Lasix, losartan, Coreg, hydralazine  Hyponatremia, chronic -Baseline appears to be in the 133-135 range, currently 131 -Continue to monitor BMP   Chronic kidney disease, stage II -Ultrasound April 2016 consistent with medical renal disease -Continue to monitor BMP closely as patient is being diuresed  Hypokalemia -Secondary to diuresis -Continue to replace and monitor BMP  Chronic microcytic Anemia -Baseline hemoglobin approximately 10, currently 10.2 -Continue to monitor CBC   Hyperglycemia -Hemoglobin A1c 6.6  Nasal congestion -Added flonase  Code Status: Full  Family Communication: None at bedside.  Disposition Plan: Admitted.  Pending further recommendations from cardiology.   Time Spent in minutes   30 minutes  Procedures  Echocardiogram  Consults   Cardiology  DVT Prophylaxis  Lovenox  Lab Results  Component Value Date   PLT 347 10/30/2015    Medications  Scheduled Meds: . carvedilol  12.5 mg Oral BID WC  .  ceFEPime (MAXIPIME) IV  2 g Intravenous Q12H  . enoxaparin (LOVENOX) injection  60 mg Subcutaneous QHS  . fluticasone  1 spray Each Nare Daily  . furosemide  80 mg Intravenous BID  . hydrALAZINE  25 mg Oral TID  . isosorbide mononitrate  30 mg Oral Daily  . losartan  100 mg Oral Daily  . potassium chloride SA  20 mEq Oral Daily  . sodium chloride flush  3 mL Intravenous Q12H  . spironolactone  12.5  mg Oral Daily  . vancomycin  750 mg Intravenous Q12H   Continuous Infusions:  PRN Meds:.ipratropium-albuterol  Antibiotics    Anti-infectives    Start     Dose/Rate Route Frequency Ordered Stop   10/29/15 0000  ceFEPIme (MAXIPIME) 2 g in dextrose 5 % 50 mL IVPB     2 g 100 mL/hr over 30 Minutes Intravenous Every 12 hours 10/28/15 1144     10/28/15 2200  vancomycin (VANCOCIN) IVPB 750 mg/150 ml premix     750 mg 150 mL/hr over 60 Minutes Intravenous Every 12 hours 10/28/15 1144     10/28/15 1200  ceFEPIme (MAXIPIME) 2 g in dextrose 5 % 50 mL IVPB     2 g 100 mL/hr over 30 Minutes Intravenous  Once 10/28/15 0850 10/28/15 1237   10/28/15 0930  vancomycin (VANCOCIN) 2,000 mg in sodium chloride 0.9 % 500 mL IVPB     2,000 mg 250 mL/hr over 120 Minutes Intravenous  Once 10/28/15 0850 10/28/15 1155   10/28/15 0130  cefTRIAXone (ROCEPHIN) 1 g in dextrose 5 % 50 mL IVPB  Status:  Discontinued     1 g 100 mL/hr over 30 Minutes Intravenous Daily at bedtime 10/28/15 0103 10/28/15 0846   10/28/15 0115  doxycycline (VIBRA-TABS) tablet 100 mg  Status:  Discontinued     100 mg Oral Every 12 hours 10/28/15 0103 10/28/15 0846        Subjective:   Austin Mccarty seen and examined today.  Patient denies shortness of breath.  Feels his lower extremity edema is improving.  Complains of nasal congestion and dryness. Denies chest pain, palpitations, dizziness, headache, abdominal pain.  Objective:   Filed Vitals:   10/29/15 1652 10/29/15 1930 10/30/15 0500 10/30/15 0930  BP: 142/80 127/69 155/88 140/80  Pulse: 89 91 100   Temp: 97.5 F (36.4 C) 98 F (36.7 C) 97.5 F (36.4 C)   TempSrc: Oral Oral Oral   Resp: Height:      Weight:   115.168 kg (253 lb 14.4 oz)   SpO2: 100% 100% 98%     Wt Readings from Last 3 Encounters:  10/30/15 115.168 kg (253 lb 14.4 oz)  10/27/15 118.752 kg (261 lb 12.8 oz)  10/25/15 119.296 kg (263 lb)     Intake/Output Summary (Last 24 hours) at  10/30/15 1116 Last data filed at 10/30/15 0923  Gross per 24 hour  Intake   1000 ml  Output    951 ml  Net     49 ml    Exam  General: Well developed, well nourished, NAD  HEENT: NCAT, mucous membranes moist.   Neck: Supple, + JVD, no masses  Cardiovascular: S1 S2 auscultated, no murmurs, RRR  Respiratory: Clear breath sounds  Abdomen: Soft, nontender, nondistended, + bowel sounds  Extremities: warm dry without cyanosis clubbing. 2+LE edema B/L  Neuro: AAOx3, nonfocal  Psych: Normal affect and demeanor, pleasant    Data Review   Micro Results  Recent Results (from the past 240 hour(s))  Culture, blood (routine x 2) Call MD if unable to obtain prior to antibiotics being given     Status: None (Preliminary result)   Collection Time: 10/28/15  2:46 AM  Result Value Ref Range Status   Specimen Description BLOOD LEFT ARM  Final   Special Requests BOTTLES DRAWN AEROBIC AND ANAEROBIC 10CC   Final   Culture NO GROWTH 2 DAYS  Final   Report Status PENDING  Incomplete  Culture, blood (routine x 2) Call MD if unable to obtain prior to antibiotics being given     Status: None (Preliminary result)   Collection Time: 10/28/15  2:51 AM  Result Value Ref Range Status   Specimen Description BLOOD LEFT HAND  Final   Special Requests BOTTLES DRAWN AEROBIC AND ANAEROBIC 10CC  Final   Culture NO GROWTH 2 DAYS  Final   Report Status PENDING  Incomplete    Radiology Reports Dg Chest 2 View  10/27/2015  CLINICAL DATA:  Bilateral pedal edema, cough, shortness of breath EXAM: CHEST  2 VIEW COMPARISON:  CT chest 10/04/2015 FINDINGS: There is bilateral interstitial and alveolar airspace opacities. There is no pleural effusion or pneumothorax. There is stable cardiomegaly. There is no acute osseous abnormality. IMPRESSION: Findings concerning for pulmonary edema versus multilobar pneumonia. Electronically Signed   By: Elige Ko   On: 10/27/2015 18:28   Dg Chest 2 View  10/03/2015  CLINICAL  DATA:  Shortness of breath and lower extremity edema. Chronic renal failure EXAM: CHEST  2 VIEW COMPARISON:  September 29, 2015 FINDINGS: There is widespread interstitial and alveolar opacity throughout the lungs, stable. There is cardiomegaly with pulmonary venous hypertension. No adenopathy. No appreciable effusions. No demonstrable bone lesions. IMPRESSION: Widespread interstitial and alveolar opacity bilaterally with cardiomegaly and pulmonary venous hypertension. Differential considerations for this extensive abnormality include congestive heart failure. Widespread pneumonia, Goodpasture's disease with hemorrhage, hypersensitivity pneumonitis, and ARDS. More than 1 of these entities may well exist concurrently. The appearance is stable compared to recent prior study. Electronically Signed   By: Bretta Bang III M.D.   On: 10/03/2015 20:39   Ct Chest Wo Contrast  10/28/2015  CLINICAL DATA:  50 year old male with shortness of breath AP EXAM: CT CHEST WITHOUT CONTRAST TECHNIQUE: Multidetector CT imaging of the chest was performed following the standard protocol without IV contrast. COMPARISON:  Radiograph dated 10/27/2015 FINDINGS: Evaluation of this exam is limited in the absence of intravenous contrast. There bilateral multi focal airspace ground-glass opacities as well as areas of nodularity most compatible with multifocal pneumonia. The degree of congestive changes may be present. Overall the appearance of the ovary densities have improved compared to the prior study. There is no pleural effusion or pneumothorax. The central airways are patent. The thoracic aorta and central pulmonary arteries are grossly unremarkable on this noncontrast study. Top-normal cardiac size. There is hypoattenuation of the cardiac blood pool suggestive of a degree of anemia. Clinical correlation is recommended. There mediastinal adenopathy most prominent involving the prevascular and right paratracheal space. The esophagus is  grossly unremarkable. There is no axillary adenopathy. There is diffuse subcutaneous soft tissue stranding of the thoracic wall. Bold right posterior rib fractures noted. No acute fracture. The visualized upper abdomen is grossly unremarkable. IMPRESSION: Diffuse displaced and nodular opacities concerning for multifocal pneumonia. The overall there has been interval improvement in the pulmonary opacities compared to the prior study. A degree of congestive changes is likely present as well. Correlation  with clinical exam recommended. Electronically Signed   By: Elgie Collard M.D.   On: 10/28/2015 00:32   Ct Chest Wo Contrast  10/04/2015  CLINICAL DATA:  50 year old male with shortness of breath EXAM: CT CHEST WITHOUT CONTRAST TECHNIQUE: Multidetector CT imaging of the chest was performed following the standard protocol without IV contrast. COMPARISON:  Chest radiograph dated 10/03/2015 and CT dated 01/08/2015 FINDINGS: Evaluation of this exam is limited in the absence of intravenous contrast. There is multi focal airspace opacities involving upper and lower lobes bilaterally with areas of air bronchogram most compatible with multifocal pneumonia. Pulmonary edema is not excluded. Clinical correlation is recommended. There is no pleural effusion or pneumothorax. The central airways are patent. The thoracic aorta and pulmonary arteries are grossly unremarkable on this noncontrast study. There is no cardiomegaly or pericardial effusion. There is mild approximation of the cardiac blood pool suggestive of a degree of anemia. Mildly enlarged paratracheal and mediastinal adenopathy. The esophagus is grossly unremarkable. No thyroid nodule identified. The chest wall soft tissues appear unremarkable. Mild degenerative changes of the spine. Old right seventh rib fracture. No acute fracture. The visualized upper abdomen appears unremarkable. IMPRESSION: Bilateral airspace opacity most compatible with multifocal pneumonia.  Underlying edema is not excluded. Clinical correlation and follow-up recommended. Electronically Signed   By: Elgie Collard M.D.   On: 10/04/2015 02:43    CBC  Recent Labs Lab 10/27/15 1742 10/28/15 0037 10/28/15 0246 10/29/15 0420 10/30/15 0257  WBC 8.5 8.6 8.7 6.4 6.3  HGB 9.8* 10.4* 10.4* 10.3* 10.2*  HCT 31.0* 31.1* 31.4* 32.4* 32.5*  PLT 308 339 354 360 347  MCV 68.9* 68.7* 68.7* 68.9* 68.9*  MCH 21.8* 23.0* 22.8* 21.9* 21.6*  MCHC 31.6 33.4 33.1 31.8 31.4  RDW 15.9* 15.9* 16.1* 16.1* 15.9*  LYMPHSABS  --   --  1.3 1.2 1.2  MONOABS  --   --  0.8 0.6 0.6  EOSABS  --   --  0.2 0.2 0.2  BASOSABS  --   --  0.0 0.1 0.0    Chemistries   Recent Labs Lab 10/27/15 1742 10/27/15 2212 10/28/15 0037 10/28/15 0246 10/29/15 0420 10/30/15 0257  NA 132* 133*  --  131* 132* 131*  K 3.9 3.6  --  3.4* 3.3* 3.6  CL 95* 96*  --  96* 93* 93*  CO2 26 26  --  25 29 29   GLUCOSE 129* 99  --  179* 136* 150*  BUN 17 16  --  17 15 14   CREATININE 1.46* 1.38* 1.44* 1.47* 1.33* 1.39*  CALCIUM 8.3* 8.4*  --  8.2* 8.3* 8.3*  MG  --   --  1.7  --   --   --   AST  --  19  --  18  --   --   ALT  --  23  --  24  --   --   ALKPHOS  --  65  --  62  --   --   BILITOT  --  0.6  --  0.5  --   --    ------------------------------------------------------------------------------------------------------------------ estimated creatinine clearance is 87.4 mL/min (by C-G formula based on Cr of 1.39). ------------------------------------------------------------------------------------------------------------------  Recent Labs  10/29/15 0420  HGBA1C 6.6*   ------------------------------------------------------------------------------------------------------------------  Recent Labs  10/29/15 0420  CHOL 109  HDL 34*  LDLCALC 63  TRIG 58  CHOLHDL 3.2   ------------------------------------------------------------------------------------------------------------------ No results for input(s):  TSH, T4TOTAL, T3FREE, THYROIDAB in the last 72  hours.  Invalid input(s): FREET3 ------------------------------------------------------------------------------------------------------------------  Recent Labs  10/28/15 1438  VITAMINB12 222  FOLATE 7.8  FERRITIN 190  TIBC 245*  IRON 29*  RETICCTPCT 2.8    Coagulation profile No results for input(s): INR, PROTIME in the last 168 hours.  No results for input(s): DDIMER in the last 72 hours.  Cardiac Enzymes  Recent Labs Lab 10/29/15 2039 10/30/15 0257 10/30/15 1031  TROPONINI <0.03 <0.03 <0.03   ------------------------------------------------------------------------------------------------------------------ Invalid input(s): POCBNP    Rue Tinnel D.O. on 10/30/2015 at 11:16 AM  Between 7am to 7pm - Pager - [phone]  After 7pm go to [email or web address] - password [email or web address]  And look for the night coverage person covering for me after hours  [facility] Office  [phone]

## 2015-10-31 LAB — CBC WITH DIFFERENTIAL/PLATELET
BASOS ABS: 0 10*3/uL (ref 0.0–0.1)
Basophils Relative: 0 %
EOS ABS: 0.2 10*3/uL (ref 0.0–0.7)
Eosinophils Relative: 3 %
HCT: 32.8 % — ABNORMAL LOW (ref 39.0–52.0)
HEMOGLOBIN: 10.5 g/dL — AB (ref 13.0–17.0)
LYMPHS PCT: 26 %
Lymphs Abs: 1.7 10*3/uL (ref 0.7–4.0)
MCH: 21.8 pg — ABNORMAL LOW (ref 26.0–34.0)
MCHC: 32 g/dL (ref 30.0–36.0)
MCV: 68.2 fL — ABNORMAL LOW (ref 78.0–100.0)
MONOS PCT: 11 %
Monocytes Absolute: 0.7 10*3/uL (ref 0.1–1.0)
NEUTROS ABS: 3.9 10*3/uL (ref 1.7–7.7)
Neutrophils Relative %: 60 %
Platelets: 339 10*3/uL (ref 150–400)
RBC: 4.81 MIL/uL (ref 4.22–5.81)
RDW: 16.1 % — AB (ref 11.5–15.5)
WBC: 6.5 10*3/uL (ref 4.0–10.5)

## 2015-10-31 LAB — BASIC METABOLIC PANEL
ANION GAP: 8 (ref 5–15)
BUN: 14 mg/dL (ref 6–20)
CALCIUM: 8.2 mg/dL — AB (ref 8.9–10.3)
CO2: 29 mmol/L (ref 22–32)
Chloride: 94 mmol/L — ABNORMAL LOW (ref 101–111)
Creatinine, Ser: 1.31 mg/dL — ABNORMAL HIGH (ref 0.61–1.24)
GLUCOSE: 109 mg/dL — AB (ref 65–99)
POTASSIUM: 3.5 mmol/L (ref 3.5–5.1)
SODIUM: 131 mmol/L — AB (ref 135–145)

## 2015-10-31 LAB — TROPONIN I

## 2015-10-31 MED ORDER — FLUTICASONE PROPIONATE 50 MCG/ACT NA SUSP
2.0000 | Freq: Every day | NASAL | Status: DC
  Administered 2015-10-31 – 2015-11-01 (×2): 2 via NASAL

## 2015-10-31 MED ORDER — FUROSEMIDE 80 MG PO TABS
80.0000 mg | ORAL_TABLET | Freq: Two times a day (BID) | ORAL | Status: DC
  Administered 2015-10-31 – 2015-11-01 (×2): 80 mg via ORAL
  Filled 2015-10-31 (×2): qty 1

## 2015-10-31 NOTE — Progress Notes (Signed)
Pharmacy Antibiotic Note  Austin Mccarty is a 50 y.o. male admitted on 10/27/2015. Pharmacy has been consulted for cefepime and vancomycin for possible HCAP. WBC wnl, afebrile, SCr 1.31; appears to be at baseline.   Plan: -Continue Vancomycin 750 q12h -Cefepime 2 g IV q12h -Monitor renal fx, cultures, duration of therapy  -Consider de-escalation as cultures remain negative (D/C vancomycin; cefepime>rocephin)  Height:  (185.4 cm) Weight: 248 lb 3.2 oz (112.583 kg) (scale b) IBW/kg (Calculated) : 79.9  Temp (24hrs), Avg:98.2 F (36.8 C), Min:97.8 F (36.6 C), Max:98.5 F (36.9 C)   Recent Labs Lab 10/28/15 0037 10/28/15 0246 10/29/15 0420 10/30/15 0257 10/31/15 0346  WBC 8.6 8.7 6.4 6.3 6.5  CREATININE 1.44* 1.47* 1.33* 1.39* 1.31*    Estimated Creatinine Clearance: 91.7 mL/min (by C-G formula based on Cr of 1.31).    Allergies  Allergen Reactions  . Ibuprofen Itching and Swelling    Antimicrobials this admission:  2/9 ceftriaxone x1 2/9 doxy x1 2/9 vanc > 2/9 cefepime >  Microbiology results:  2/9 blood cx: NGTD 2/9 sputum: no result 2/9 strep Ag: neg 2/9 legionella: neg 2/9 influenza: neg  Thank you for allowing pharmacy to be a part of this patient's care.  Reuel Derby, PharmD PGY1 Pharmacy Resident 10/31/2015 9:42 AM

## 2015-10-31 NOTE — Progress Notes (Signed)
[facility]                                                                              Patient Demographics  Austin Mccarty, is a 50 y.o. male, DOB - 1966-01-22, ZOX:[id number]  Admit date - 10/27/2015   Admitting Physician Bobette Mo, MD  Outpatient Primary MD for the patient is [name], MD  LOS - 3   Chief Complaint  Patient presents with  . Shortness of Breath  . Leg Swelling      HPI on 10/27/2015 by Dr. Sanda Klein Austin Mccarty is a 50 y.o. male with a past medical history of chronic systolic CHF, hypertension, stage II CKD, recently admitted and discharged for community-acquired pneumonia comes to the emergency department due to progressively worse dyspnea and lower extremity edema for about a week.  Per patient, he was admitted and discharge for community-acquired pneumonia last month. He states that he was doing well post discharge, but for the past 2 weeks started noticing increased lower extremity edema. Since about a week ago, he started noticing progressively worse dyspnea and had his furosemide dose 4 days ago. He denies chest pain, palpitations, orthopnea or PND, but complains of occasional positional dizziness. He also complains of persistent episodes of coughing, wheezing, subjective fevers, chills, fatigue, malaise and loss of appetite.   When seen, the patient stated he was feeling better with supplemental oxygen. He was in no acute distress.  Assessment & Plan   Acute hypoxic respiratory failure -Resolved -Likely multifactorial including multifocal pneumonia and heart failure exacerbation -CT chest shows diffuse displaced and nodular opacities, degree of congestive changes -Patient currently maintaining oxygen saturations in the high 90s on room air  Acute on chronic systolic heart failure exacerbation -BNP 582.5 -Continue spironolactone, losartan, Imdur, Coreg, hydralazine -Echocardiogram EF 30-35% -Echocardiogram in 2016 showed an  EF of 35-40% with diffuse hypokinesis -Cardiology consulted and appreciated -Continue to monitor intake and output, daily weights -Urine output over the last 24 hours 2425cc -LE edema improving -Will transition to PO lasix today  Multifocal pneumonia -CT chest showed diffuse multifocal pneumonia with interval improvement -Continue vancomycin and cefepime -Influenza panel negative -Strep pneumonia and legionella urine antigens negative -Patient is experiencing cough possibly secondary to ACE inhibitor, switched to ARB  Essential hypertension -Continue spironolactone, Lasix, losartan, Coreg, hydralazine  Hyponatremia, chronic -Baseline appears to be in the 133-135 range, currently 131 -Continue to monitor BMP   Chronic kidney disease, stage II -Ultrasound April 2016 consistent with medical renal disease -Continue to monitor BMP closely as patient is being diuresed  Hypokalemia -Secondary to diuresis -Continue to replace and monitor BMP  Chronic microcytic Anemia -Baseline hemoglobin approximately 10, currently 10.5 -Continue to monitor CBC   Hyperglycemia -Hemoglobin A1c 6.6 -Consult nutrition   Nasal congestion -Added flonase  Code Status: Full  Family Communication: None at bedside.  Disposition Plan: Admitted.  Transition to PO lasix and monitor.   Time Spent in minutes   30 minutes  Procedures  Echocardiogram  Consults   Cardiology  DVT Prophylaxis  Lovenox  Lab Results  Component Value Date   PLT 339 10/31/2015    Medications  Scheduled Meds: . carvedilol  12.5 mg Oral BID WC  . ceFEPime (MAXIPIME) IV  2 g Intravenous Q12H  . enoxaparin (LOVENOX) injection  60 mg Subcutaneous QHS  . fluticasone  2 spray Each Nare Daily  . furosemide  80 mg Oral BID  . hydrALAZINE  25 mg Oral TID  . isosorbide mononitrate  30 mg Oral Daily  . losartan  100 mg Oral Daily  . potassium chloride SA  20 mEq Oral Daily  . sodium chloride flush  3 mL Intravenous  Q12H  . spironolactone  12.5 mg Oral Daily  . vancomycin  750 mg Intravenous Q12H   Continuous Infusions:  PRN Meds:.ipratropium-albuterol  Antibiotics    Anti-infectives    Start     Dose/Rate Route Frequency Ordered Stop   10/29/15 0000  ceFEPIme (MAXIPIME) 2 g in dextrose 5 % 50 mL IVPB     2 g 100 mL/hr over 30 Minutes Intravenous Every 12 hours 10/28/15 1144     10/28/15 2200  vancomycin (VANCOCIN) IVPB 750 mg/150 ml premix     750 mg 150 mL/hr over 60 Minutes Intravenous Every 12 hours 10/28/15 1144     10/28/15 1200  ceFEPIme (MAXIPIME) 2 g in dextrose 5 % 50 mL IVPB     2 g 100 mL/hr over 30 Minutes Intravenous  Once 10/28/15 0850 10/28/15 1237   10/28/15 0930  vancomycin (VANCOCIN) 2,000 mg in sodium chloride 0.9 % 500 mL IVPB     2,000 mg 250 mL/hr over 120 Minutes Intravenous  Once 10/28/15 0850 10/28/15 1155   10/28/15 0130  cefTRIAXone (ROCEPHIN) 1 g in dextrose 5 % 50 mL IVPB  Status:  Discontinued     1 g 100 mL/hr over 30 Minutes Intravenous Daily at bedtime 10/28/15 0103 10/28/15 0846   10/28/15 0115  doxycycline (VIBRA-TABS) tablet 100 mg  Status:  Discontinued     100 mg Oral Every 12 hours 10/28/15 0103 10/28/15 0846        Subjective:   Maddax Drawdy seen and examined today.  Patient leg edema is improving, and his SOB has resolved. Continues to complain of  nasal congestion and dryness. Denies chest pain, palpitations, dizziness, headache, abdominal pain.  Objective:   Filed Vitals:   10/30/15 1208 10/30/15 1930 10/31/15 0418 10/31/15 1252  BP: 141/81 149/79 159/82 146/71  Pulse: 90 82 102 95  Temp: 97.8 F (36.6 C) 98.2 F (36.8 C) 98.5 F (36.9 C) 98.2 F (36.8 C)  TempSrc: Oral Oral Oral Oral  Resp: 20 18 18 20   Height:      Weight:   112.583 kg (248 lb 3.2 oz)   SpO2: 94% 98% 98% 100%    Wt Readings from Last 3 Encounters:  10/31/15 112.583 kg (248 lb 3.2 oz)  10/27/15 118.752 kg (261 lb 12.8 oz)  10/25/15 119.296 kg (263 lb)      Intake/Output Summary (Last 24 hours) at 10/31/15 1323 Last data filed at 10/31/15 1254  Gross per 24 hour  Intake   1110 ml  Output   1725 ml  Net   -615 ml    Exam  General: Well developed, well nourished, NAD  HEENT: NCAT, mucous membranes moist.   Cardiovascular: S1 S2 auscultated, no murmurs, RRR  Respiratory: Clear to auscultation   Abdomen: Soft, nontender, nondistended, + bowel sounds  Extremities: warm dry without cyanosis clubbing. +LE edema B/L  Neuro: AAOx3, nonfocal  Psych: Normal affect and demeanor, pleasant    Data Review   Micro Results  Recent Results (from the past 240 hour(s))  Culture, blood (routine x 2) Call MD if unable to obtain prior to antibiotics being given     Status: None (Preliminary result)   Collection Time: 10/28/15  2:46 AM  Result Value Ref Range Status   Specimen Description BLOOD LEFT ARM  Final   Special Requests BOTTLES DRAWN AEROBIC AND ANAEROBIC 10CC   Final   Culture NO GROWTH 3 DAYS  Final   Report Status PENDING  Incomplete  Culture, blood (routine x 2) Call MD if unable to obtain prior to antibiotics being given     Status: None (Preliminary result)   Collection Time: 10/28/15  2:51 AM  Result Value Ref Range Status   Specimen Description BLOOD LEFT HAND  Final   Special Requests BOTTLES DRAWN AEROBIC AND ANAEROBIC 10CC  Final   Culture NO GROWTH 3 DAYS  Final   Report Status PENDING  Incomplete    Radiology Reports Dg Chest 2 View  10/27/2015  CLINICAL DATA:  Bilateral pedal edema, cough, shortness of breath EXAM: CHEST  2 VIEW COMPARISON:  CT chest 10/04/2015 FINDINGS: There is bilateral interstitial and alveolar airspace opacities. There is no pleural effusion or pneumothorax. There is stable cardiomegaly. There is no acute osseous abnormality. IMPRESSION: Findings concerning for pulmonary edema versus multilobar pneumonia. Electronically Signed   By: Elige Ko   On: 10/27/2015 18:28   Dg Chest 2  View  10/03/2015  CLINICAL DATA:  Shortness of breath and lower extremity edema. Chronic renal failure EXAM: CHEST  2 VIEW COMPARISON:  September 29, 2015 FINDINGS: There is widespread interstitial and alveolar opacity throughout the lungs, stable. There is cardiomegaly with pulmonary venous hypertension. No adenopathy. No appreciable effusions. No demonstrable bone lesions. IMPRESSION: Widespread interstitial and alveolar opacity bilaterally with cardiomegaly and pulmonary venous hypertension. Differential considerations for this extensive abnormality include congestive heart failure. Widespread pneumonia, Goodpasture's disease with hemorrhage, hypersensitivity pneumonitis, and ARDS. More than 1 of these entities may well exist concurrently. The appearance is stable compared to recent prior study. Electronically Signed   By: Bretta Bang III M.D.   On: 10/03/2015 20:39   Ct Chest Wo Contrast  10/28/2015  CLINICAL DATA:  49 year old male with shortness of breath AP EXAM: CT CHEST WITHOUT CONTRAST TECHNIQUE: Multidetector CT imaging of the chest was performed following the standard protocol without IV contrast. COMPARISON:  Radiograph dated 10/27/2015 FINDINGS: Evaluation of this exam is limited in the absence of intravenous contrast. There bilateral multi focal airspace ground-glass opacities as well as areas of nodularity most compatible with multifocal pneumonia. The degree of congestive changes may be present. Overall the appearance of the ovary densities have improved compared to the prior study. There is no pleural effusion or pneumothorax. The central airways are patent. The thoracic aorta and central pulmonary arteries are grossly unremarkable on this noncontrast study. Top-normal cardiac size. There is hypoattenuation of the cardiac blood pool suggestive of a degree of anemia. Clinical correlation is recommended. There mediastinal adenopathy most prominent involving the prevascular and right  paratracheal space. The esophagus is grossly unremarkable. There is no axillary adenopathy. There is diffuse subcutaneous soft tissue stranding of the thoracic wall. Bold right posterior rib fractures noted. No acute fracture. The visualized upper abdomen is grossly unremarkable. IMPRESSION: Diffuse displaced and nodular opacities concerning for multifocal pneumonia. The overall there has been interval improvement in the pulmonary opacities compared to the prior study. A degree of congestive changes is likely present as well. Correlation  with clinical exam recommended. Electronically Signed   By: Elgie Collard M.D.   On: 10/28/2015 00:32   Ct Chest Wo Contrast  10/04/2015  CLINICAL DATA:  50 year old male with shortness of breath EXAM: CT CHEST WITHOUT CONTRAST TECHNIQUE: Multidetector CT imaging of the chest was performed following the standard protocol without IV contrast. COMPARISON:  Chest radiograph dated 10/03/2015 and CT dated 01/08/2015 FINDINGS: Evaluation of this exam is limited in the absence of intravenous contrast. There is multi focal airspace opacities involving upper and lower lobes bilaterally with areas of air bronchogram most compatible with multifocal pneumonia. Pulmonary edema is not excluded. Clinical correlation is recommended. There is no pleural effusion or pneumothorax. The central airways are patent. The thoracic aorta and pulmonary arteries are grossly unremarkable on this noncontrast study. There is no cardiomegaly or pericardial effusion. There is mild approximation of the cardiac blood pool suggestive of a degree of anemia. Mildly enlarged paratracheal and mediastinal adenopathy. The esophagus is grossly unremarkable. No thyroid nodule identified. The chest wall soft tissues appear unremarkable. Mild degenerative changes of the spine. Old right seventh rib fracture. No acute fracture. The visualized upper abdomen appears unremarkable. IMPRESSION: Bilateral airspace opacity most  compatible with multifocal pneumonia. Underlying edema is not excluded. Clinical correlation and follow-up recommended. Electronically Signed   By: Elgie Collard M.D.   On: 10/04/2015 02:43    CBC  Recent Labs Lab 10/28/15 0037 10/28/15 0246 10/29/15 0420 10/30/15 0257 10/31/15 0346  WBC 8.6 8.7 6.4 6.3 6.5  HGB 10.4* 10.4* 10.3* 10.2* 10.5*  HCT 31.1* 31.4* 32.4* 32.5* 32.8*  PLT 339 354 360 347 339  MCV 68.7* 68.7* 68.9* 68.9* 68.2*  MCH 23.0* 22.8* 21.9* 21.6* 21.8*  MCHC 33.4 33.1 31.8 31.4 32.0  RDW 15.9* 16.1* 16.1* 15.9* 16.1*  LYMPHSABS  --  1.3 1.2 1.2 1.7  MONOABS  --  0.8 0.6 0.6 0.7  EOSABS  --  0.2 0.2 0.2 0.2  BASOSABS  --  0.0 0.1 0.0 0.0    Chemistries   Recent Labs Lab 10/27/15 2212 10/28/15 0037 10/28/15 0246 10/29/15 0420 10/30/15 0257 10/31/15 0346  NA 133*  --  131* 132* 131* 131*  K 3.6  --  3.4* 3.3* 3.6 3.5  CL 96*  --  96* 93* 93* 94*  CO2 26  --  25 29 29 29   GLUCOSE 99  --  179* 136* 150* 109*  BUN 16  --  17 15 14 14   CREATININE 1.38* 1.44* 1.47* 1.33* 1.39* 1.31*  CALCIUM 8.4*  --  8.2* 8.3* 8.3* 8.2*  MG  --  1.7  --   --   --   --   AST 19  --  18  --   --   --   ALT 23  --  24  --   --   --   ALKPHOS 65  --  62  --   --   --   BILITOT 0.6  --  0.5  --   --   --    ------------------------------------------------------------------------------------------------------------------ estimated creatinine clearance is 91.7 mL/min (by C-G formula based on Cr of 1.31). ------------------------------------------------------------------------------------------------------------------  Recent Labs  10/29/15 0420  HGBA1C 6.6*   ------------------------------------------------------------------------------------------------------------------  Recent Labs  10/29/15 0420  CHOL 109  HDL 34*  LDLCALC 63  TRIG 58  CHOLHDL 3.2    ------------------------------------------------------------------------------------------------------------------ No results for input(s): TSH, T4TOTAL, T3FREE, THYROIDAB in the last 72 hours.  Invalid input(s): FREET3 ------------------------------------------------------------------------------------------------------------------  Recent  Labs  10/28/15 1438  VITAMINB12 222  FOLATE 7.8  FERRITIN 190  TIBC 245*  IRON 29*  RETICCTPCT 2.8    Coagulation profile No results for input(s): INR, PROTIME in the last 168 hours.  No results for input(s): DDIMER in the last 72 hours.  Cardiac Enzymes  Recent Labs Lab 10/30/15 2026 10/31/15 0346 10/31/15 0908  TROPONINI <0.03 <0.03 <0.03   ------------------------------------------------------------------------------------------------------------------ Invalid input(s): POCBNP    Keaghan Staton D.O. on 10/31/2015 at 1:23 PM  Between 7am to 7pm - Pager - [phone]  After 7pm go to [email or web address] - password [email or web address]  And look for the night coverage person covering for me after hours  [facility] Office  [phone]

## 2015-11-01 LAB — BASIC METABOLIC PANEL
Anion gap: 9 (ref 5–15)
BUN: 18 mg/dL (ref 6–20)
CALCIUM: 8.4 mg/dL — AB (ref 8.9–10.3)
CO2: 25 mmol/L (ref 22–32)
Chloride: 94 mmol/L — ABNORMAL LOW (ref 101–111)
Creatinine, Ser: 1.36 mg/dL — ABNORMAL HIGH (ref 0.61–1.24)
GFR calc Af Amer: >60 mL/min (ref >60)
GLUCOSE: 98 mg/dL (ref 65–99)
Potassium: 3.8 mmol/L (ref 3.5–5.1)
Sodium: 128 mmol/L — ABNORMAL LOW (ref 135–145)

## 2015-11-01 LAB — CBC
HCT: 32.9 % — ABNORMAL LOW (ref 39.0–52.0)
Hemoglobin: 10.7 g/dL — ABNORMAL LOW (ref 13.0–17.0)
MCH: 22.5 pg — AB (ref 26.0–34.0)
MCHC: 32.5 g/dL (ref 30.0–36.0)
MCV: 69.1 fL — AB (ref 78.0–100.0)
PLATELETS: 323 10*3/uL (ref 150–400)
RBC: 4.76 MIL/uL (ref 4.22–5.81)
RDW: 16.3 % — AB (ref 11.5–15.5)
WBC: 6 10*3/uL (ref 4.0–10.5)

## 2015-11-01 LAB — TSH: TSH: 2.001 u[IU]/mL (ref 0.350–4.500)

## 2015-11-01 MED ORDER — FLUTICASONE PROPIONATE 50 MCG/ACT NA SUSP: 2.0000 | Freq: Every day | NASAL | Status: AC

## 2015-11-01 MED ORDER — FUROSEMIDE 80 MG PO TABS: 80.0000 mg | ORAL_TABLET | Freq: Two times a day (BID) | ORAL | Status: DC

## 2015-11-01 MED ORDER — LEVOFLOXACIN 750 MG PO TABS: 750.0000 mg | ORAL_TABLET | Freq: Every day | ORAL | Status: DC

## 2015-11-01 MED ORDER — SPIRONOLACTONE 25 MG PO TABS: 12.5000 mg | ORAL_TABLET | Freq: Every day | ORAL | Status: DC

## 2015-11-01 MED ORDER — LOSARTAN POTASSIUM 100 MG PO TABS: 100.0000 mg | ORAL_TABLET | Freq: Every day | ORAL | Status: AC

## 2015-11-01 MED FILL — levoFLOXacin 750 MG TABS: 750 | 3 days supply | Qty: 3 | Fill #0

## 2015-11-01 MED FILL — SPIRONOLACTONE 25 MG TABLET: 25 | 30 days supply | Qty: 15 | Fill #0

## 2015-11-01 MED FILL — LOSARTAN POTASSIUM 100 MG T: 100 | 30 days supply | Qty: 30 | Fill #0

## 2015-11-01 MED FILL — FLUTICASONE PROP 50 MCG SPR: 50 | 30 days supply | Qty: 16 | Fill #0

## 2015-11-01 NOTE — Discharge Summary (Signed)
Physician Discharge Summary  Austin Mccarty ZOX:[id number] DOB: 1965-11-22 DOA: 10/27/2015  PCP: [name], MD  Admit date: 10/27/2015 Discharge date: 11/01/2015  Time spent: 45 minutes  Recommendations for Outpatient Follow-up:  Patient will be discharged to home.  Patient will need to follow up with primary care provider within one week of discharge and discuss diabetes, repeat CBC and BMP.  Follow up with cardiology in 2-4 weeks.  Patient should continue medications as prescribed.  Patient should follow a heart healthy/ carb modified diet.   Discharge Diagnoses:  Acute hypoxic respiratory failure  Acute on chronic systolic heart failure Multifocal pneumonia Essential hypertension Chronic hyponatremia Chronic kidney disease, stage III Hypokalemia Chronic microcytic anemia Hyperglycemia Nasal congestion  Discharge Condition: Stable  Diet recommendation:  Heart healthy/carb modified  Filed Weights   10/30/15 0500 10/31/15 0418 11/01/15 0705  Weight: 115.168 kg (253 lb 14.4 oz) 112.583 kg (248 lb 3.2 oz) 112.129 kg (247 lb 3.2 oz)    History of present illness:  on 10/27/2015 by Dr. Sanda Klein [name] is a 50 y.o. male with a past medical history of chronic systolic CHF, hypertension, stage II CKD, recently admitted and discharged for community-acquired pneumonia comes to the emergency department due to progressively worse dyspnea and lower extremity edema for about a week.  Per patient, he was admitted and discharge for community-acquired pneumonia last month. He states that he was doing well post discharge, but for the past 2 weeks started noticing increased lower extremity edema. Since about a week ago, he started noticing progressively worse dyspnea and had his furosemide dose 4 days ago. He denies chest pain, palpitations, orthopnea or PND, but complains of occasional positional dizziness. He also complains of persistent episodes of coughing, wheezing,  subjective fevers, chills, fatigue, malaise and loss of appetite.   When seen, the patient stated he was feeling better with supplemental oxygen. He was in no acute distress.  Hospital Course:  Acute hypoxic respiratory failure -Resolved -Likely multifactorial including multifocal pneumonia and heart failure exacerbation -CT chest shows diffuse displaced and nodular opacities, degree of congestive changes -Patient currently maintaining oxygen saturations in the high 90s on room air  Acute on chronic systolic heart failure exacerbation -BNP 582.5 -Continue spironolactone, losartan, Imdur, Coreg, hydralazine -Echocardiogram EF 30-35% -Echocardiogram in 2016 showed an EF of 35-40% with diffuse hypokinesis -Cardiology consulted and appreciated -Continue to monitor intake and output, daily weights -Urine output over the last 24 hours 2525cc -LE edema improving -Transitioned to PO lasix  Multifocal pneumonia -CT chest showed diffuse multifocal pneumonia with interval improvement -intially placed on  vancomycin and cefepime, discharge with 3 doses of Levaquin -Influenza panel negative -Strep pneumonia and legionella urine antigens negative -Patient is experiencing cough possibly secondary to ACE inhibitor, switched to ARB  Essential hypertension -Continue spironolactone, Lasix, losartan, Coreg, hydralazine  Hyponatremia, chronic -Baseline appears to be in the 133-135 range -TSH 2.001 -Repeat BMP in one week  Chronic kidney disease, stage II -Ultrasound April 2016 consistent with medical renal disease -Repeat BMP in one week  Hypokalemia -Secondary to diuresis  Chronic microcytic Anemia -Baseline hemoglobin approximately 10, currently 10.7  Hyperglycemia -Hemoglobin A1c 6.6 -Discuss diet and lifestyle modifications with PCP  Nasal congestion -Added flonase  Procedures  Echocardiogram  Consults  Cardiology  Discharge Exam: Filed Vitals:   10/31/15 2136  11/01/15 0705  BP: 135/86 144/71  Pulse: 93 91  Temp: 99 F (37.2 C) 98.4 F (36.9 C)  Resp: 18 18   Exam  General: Well developed, well nourished, NAD  HEENT: NCAT, mucous membranes moist.   Cardiovascular: S1 S2 auscultated, no murmurs, RRR  Respiratory: Clear to auscultation  Abdomen: Soft, nontender, nondistended, + bowel sounds  Extremities: warm dry without cyanosis clubbing. +trace LE edema B/L  Neuro: AAOx3, nonfocal  Psych: Normal affect and demeanor, pleasant  Discharge Instructions      Discharge Instructions    Discharge instructions    Complete by:  As directed   Patient will be discharged to home.  Patient will need to follow up with primary care provider within one week of discharge and discuss diabetes, repeat CBC and BMP.  Follow up with cardiology in 2-4 weeks.  Patient should continue medications as prescribed.  Patient should follow a heart healthy/ carb modified diet.            Medication List    STOP taking these medications        cetirizine 10 MG tablet  Commonly known as:  ZYRTEC     cyclobenzaprine 5 MG tablet  Commonly known as:  FLEXERIL     lisinopril 40 MG tablet  Commonly known as:  PRINIVIL,ZESTRIL      TAKE these medications        carvedilol 12.5 MG tablet  Commonly known as:  COREG  Take 1 tablet (12.5 mg total) by mouth 2 (two) times daily with a meal.     fluticasone 50 MCG/ACT nasal spray  Commonly known as:  FLONASE  Place 2 sprays into both nostrils daily.     furosemide 80 MG tablet  Commonly known as:  LASIX  Take 1 tablet (80 mg total) by mouth 2 (two) times daily.     hydrALAZINE 25 MG tablet  Commonly known as:  APRESOLINE  Take 1 tablet (25 mg total) by mouth 3 (three) times daily.     isosorbide mononitrate 30 MG 24 hr tablet  Commonly known as:  IMDUR  Take 1 tablet (30 mg total) by mouth daily.     levofloxacin 750 MG tablet  Commonly known as:  LEVAQUIN  Take 1 tablet (750 mg total) by  mouth daily.     losartan 100 MG tablet  Commonly known as:  COZAAR  Take 1 tablet (100 mg total) by mouth daily.     potassium chloride SA 20 MEQ tablet  Commonly known as:  K-DUR,KLOR-CON  Take 1 tablet (20 mEq total) by mouth daily.     spironolactone 25 MG tablet  Commonly known as:  ALDACTONE  Take 0.5 tablets (12.5 mg total) by mouth daily.       Allergies  Allergen Reactions  . Ibuprofen Itching and Swelling   Follow-up Information    Follow up with [facility] On 11/03/2015.   Why:  10 am   Contact information:   [address] [place] [address] [place] [address] [phone]      Follow up with Tonny Bollman, MD. Schedule an appointment as soon as possible for a visit in 3 weeks.   Specialty:  Cardiology   Why:  Hospital follow up   Contact information:   [address] [place] [address] [phone]        The results of significant diagnostics from this hospitalization (including imaging, microbiology, ancillary and laboratory) are listed below for reference.    Significant Diagnostic Studies: Dg Chest 2 View  10/27/2015  CLINICAL DATA:  Bilateral pedal edema, cough, shortness of breath  EXAM: CHEST  2 VIEW COMPARISON:  CT chest 10/04/2015 FINDINGS: There is bilateral interstitial and alveolar airspace opacities. There is no pleural effusion or pneumothorax. There is stable cardiomegaly. There is no acute osseous abnormality. IMPRESSION: Findings concerning for pulmonary edema versus multilobar pneumonia. Electronically Signed   By: Elige Ko   On: 10/27/2015 18:28   Dg Chest 2 View  10/03/2015  CLINICAL DATA:  Shortness of breath and lower extremity edema. Chronic renal failure EXAM: CHEST  2 VIEW COMPARISON:  September 29, 2015 FINDINGS: There is widespread interstitial and alveolar opacity throughout the lungs, stable. There is cardiomegaly with pulmonary venous hypertension. No adenopathy. No  appreciable effusions. No demonstrable bone lesions. IMPRESSION: Widespread interstitial and alveolar opacity bilaterally with cardiomegaly and pulmonary venous hypertension. Differential considerations for this extensive abnormality include congestive heart failure. Widespread pneumonia, Goodpasture's disease with hemorrhage, hypersensitivity pneumonitis, and ARDS. More than 1 of these entities may well exist concurrently. The appearance is stable compared to recent prior study. Electronically Signed   By: Bretta Bang III M.D.   On: 10/03/2015 20:39   Ct Chest Wo Contrast  10/28/2015  CLINICAL DATA:  50 year old male with shortness of breath AP EXAM: CT CHEST WITHOUT CONTRAST TECHNIQUE: Multidetector CT imaging of the chest was performed following the standard protocol without IV contrast. COMPARISON:  Radiograph dated 10/27/2015 FINDINGS: Evaluation of this exam is limited in the absence of intravenous contrast. There bilateral multi focal airspace ground-glass opacities as well as areas of nodularity most compatible with multifocal pneumonia. The degree of congestive changes may be present. Overall the appearance of the ovary densities have improved compared to the prior study. There is no pleural effusion or pneumothorax. The central airways are patent. The thoracic aorta and central pulmonary arteries are grossly unremarkable on this noncontrast study. Top-normal cardiac size. There is hypoattenuation of the cardiac blood pool suggestive of a degree of anemia. Clinical correlation is recommended. There mediastinal adenopathy most prominent involving the prevascular and right paratracheal space. The esophagus is grossly unremarkable. There is no axillary adenopathy. There is diffuse subcutaneous soft tissue stranding of the thoracic wall. Bold right posterior rib fractures noted. No acute fracture. The visualized upper abdomen is grossly unremarkable. IMPRESSION: Diffuse displaced and nodular opacities  concerning for multifocal pneumonia. The overall there has been interval improvement in the pulmonary opacities compared to the prior study. A degree of congestive changes is likely present as well. Correlation with clinical exam recommended. Electronically Signed   By: Elgie Collard M.D.   On: 10/28/2015 00:32   Ct Chest Wo Contrast  10/04/2015  CLINICAL DATA:  50 year old male with shortness of breath EXAM: CT CHEST WITHOUT CONTRAST TECHNIQUE: Multidetector CT imaging of the chest was performed following the standard protocol without IV contrast. COMPARISON:  Chest radiograph dated 10/03/2015 and CT dated 01/08/2015 FINDINGS: Evaluation of this exam is limited in the absence of intravenous contrast. There is multi focal airspace opacities involving upper and lower lobes bilaterally with areas of air bronchogram most compatible with multifocal pneumonia. Pulmonary edema is not excluded. Clinical correlation is recommended. There is no pleural effusion or pneumothorax. The central airways are patent. The thoracic aorta and pulmonary arteries are grossly unremarkable on this noncontrast study. There is no cardiomegaly or pericardial effusion. There is mild approximation of the cardiac blood pool suggestive of a degree of anemia. Mildly enlarged paratracheal and mediastinal adenopathy. The esophagus is grossly unremarkable. No thyroid nodule identified. The chest wall soft tissues appear unremarkable. Mild degenerative  changes of the spine. Old right seventh rib fracture. No acute fracture. The visualized upper abdomen appears unremarkable. IMPRESSION: Bilateral airspace opacity most compatible with multifocal pneumonia. Underlying edema is not excluded. Clinical correlation and follow-up recommended. Electronically Signed   By: Elgie Collard M.D.   On: 10/04/2015 02:43    Microbiology: Recent Results (from the past 240 hour(s))  Culture, blood (routine x 2) Call MD if unable to obtain prior to  antibiotics being given     Status: None (Preliminary result)   Collection Time: 10/28/15  2:46 AM  Result Value Ref Range Status   Specimen Description BLOOD LEFT ARM  Final   Special Requests BOTTLES DRAWN AEROBIC AND ANAEROBIC 10CC   Final   Culture NO GROWTH 3 DAYS  Final   Report Status PENDING  Incomplete  Culture, blood (routine x 2) Call MD if unable to obtain prior to antibiotics being given     Status: None (Preliminary result)   Collection Time: 10/28/15  2:51 AM  Result Value Ref Range Status   Specimen Description BLOOD LEFT HAND  Final   Special Requests BOTTLES DRAWN AEROBIC AND ANAEROBIC 10CC  Final   Culture NO GROWTH 3 DAYS  Final   Report Status PENDING  Incomplete     Labs: Basic Metabolic Panel:  Recent Labs Lab 10/28/15 0037 10/28/15 0246 10/29/15 0420 10/30/15 0257 10/31/15 0346 11/01/15 0336  NA  --  131* 132* 131* 131* 128*  K  --  3.4* 3.3* 3.6 3.5 3.8  CL  --  96* 93* 93* 94* 94*  CO2  --  25 29 29 29 25   GLUCOSE  --  179* 136* 150* 109* 98  BUN  --  17 15 14 14 18   CREATININE 1.44* 1.47* 1.33* 1.39* 1.31* 1.36*  CALCIUM  --  8.2* 8.3* 8.3* 8.2* 8.4*  MG 1.7  --   --   --   --   --   PHOS 3.5  --   --   --   --   --    Liver Function Tests:  Recent Labs Lab 10/27/15 2212 10/28/15 0246  AST 19 18  ALT 23 24  ALKPHOS 65 62  BILITOT 0.6 0.5  PROT 7.2 6.8  ALBUMIN 2.0* 2.1*   No results for input(s): LIPASE, AMYLASE in the last 168 hours. No results for input(s): AMMONIA in the last 168 hours. CBC:  Recent Labs Lab 10/28/15 0246 10/29/15 0420 10/30/15 0257 10/31/15 0346 11/01/15 0336  WBC 8.7 6.4 6.3 6.5 6.0  NEUTROABS 6.4 4.3 4.3 3.9  --   HGB 10.4* 10.3* 10.2* 10.5* 10.7*  HCT 31.4* 32.4* 32.5* 32.8* 32.9*  MCV 68.7* 68.9* 68.9* 68.2* 69.1*  PLT 354 360 347 339 323   Cardiac Enzymes:  Recent Labs Lab 10/30/15 1031 10/30/15 1425 10/30/15 2026 10/31/15 0346 10/31/15 0908  TROPONINI <0.03 <0.03 <0.03 <0.03 <0.03    BNP: BNP (last 3 results)  Recent Labs  09/29/15 2013 10/03/15 2212 10/27/15 2027  BNP 993.4* 1176.2* 582.5*    ProBNP (last 3 results)  Recent Labs  01/12/15 1213  PROBNP 2840.00*    CBG: No results for input(s): GLUCAP in the last 168 hours.     SignedEdsel Petrin  [facility] 11/01/2015, 11:17 AM

## 2015-11-01 NOTE — Care Management Note (Addendum)
Case Management Note  Patient Details  Name: Austin Mccarty MRN: [id number] Date of Birth: 07/01/1966  Subjective/Objective:   Pt admitted with CHF                 Action/Plan:  Pt is from home home and is already active with [facility].  Pt already active with [facility] and has follow up appt 11/03/15 10am.  HF nurse to provide HF assessment. CM will continue to montior   Expected Discharge Date:                  Expected Discharge Plan:  Home/Self Care  In-House Referral:     Discharge planning Services  CM Consult, [facility]  Post Acute Care Choice:    Choice offered to:  Patient  DME Arranged:    DME Agency:     HH Arranged:    HH Agency:     Status of Service:     Medicare Important Message Given:    Date Medicare IM Given:    Medicare IM give by:    Date Additional Medicare IM Given:    Additional Medicare Important Message give by:     If discussed at Long Length of Stay Meetings, dates discussed:    Additional Comments:  Cherylann Parr, RN 11/01/2015, 10:06 AM

## 2015-11-01 NOTE — Progress Notes (Signed)
D/c instructions with patient. All questions answered and pt verbalized understanding

## 2015-11-01 NOTE — Progress Notes (Addendum)
ReDS Vest Discharge Study  Your patient is in the Blinded arm of the Vest at Discharge study.  Your patient has had a ReDS Vest reading and the reading has been transmitted to the cloud.  Your patient is ok for discharge.    Thank You   The research team   

## 2015-11-01 NOTE — Progress Notes (Signed)
I again reviewed HF recommendations for home and answered some questions regarding diet for Austin Mccarty.  He will follow-up next week at [facility].

## 2015-11-01 NOTE — Discharge Instructions (Signed)
Heart Failure °Heart failure is a condition in which the heart has trouble pumping blood. This means your heart does not pump blood efficiently for your body to work well. In some cases of heart failure, fluid may back up into your lungs or you may have swelling (edema) in your lower legs. Heart failure is usually a long-term (chronic) condition. It is important for you to take good care of yourself and follow your health care provider's treatment plan. °CAUSES  °Some health conditions can cause heart failure. Those health conditions include: °· High blood pressure (hypertension). Hypertension causes the heart muscle to work harder than normal. When pressure in the blood vessels is high, the heart needs to pump (contract) with more force in order to circulate blood throughout the body. High blood pressure eventually causes the heart to become stiff and weak. °· Coronary artery disease (CAD). CAD is the buildup of cholesterol and fat (plaque) in the arteries of the heart. The blockage in the arteries deprives the heart muscle of oxygen and blood. This can cause chest pain and may lead to a heart attack. High blood pressure can also contribute to CAD. °· Heart attack (myocardial infarction). A heart attack occurs when one or more arteries in the heart become blocked. The loss of oxygen damages the muscle tissue of the heart. When this happens, part of the heart muscle dies. The injured tissue does not contract as well and weakens the heart's ability to pump blood. °· Abnormal heart valves. When the heart valves do not open and close properly, it can cause heart failure. This makes the heart muscle pump harder to keep the blood flowing. °· Heart muscle disease (cardiomyopathy or myocarditis). Heart muscle disease is damage to the heart muscle from a variety of causes. These can include drug or alcohol abuse, infections, or unknown reasons. These can increase the risk of heart failure. °· Lung disease. Lung disease  makes the heart work harder because the lungs do not work properly. This can cause a strain on the heart, leading it to fail. °· Diabetes. Diabetes increases the risk of heart failure. High blood sugar contributes to high fat (lipid) levels in the blood. Diabetes can also cause slow damage to tiny blood vessels that carry important nutrients to the heart muscle. When the heart does not get enough oxygen and food, it can cause the heart to become weak and stiff. This leads to a heart that does not contract efficiently. °· Other conditions can contribute to heart failure. These include abnormal heart rhythms, thyroid problems, and low blood counts (anemia). °Certain unhealthy behaviors can increase the risk of heart failure, including: °· Being overweight. °· Smoking or chewing tobacco. °· Eating foods high in fat and cholesterol. °· Abusing illicit drugs or alcohol. °· Lacking physical activity. °SYMPTOMS  °Heart failure symptoms may vary and can be hard to detect. Symptoms may include: °· Shortness of breath with activity, such as climbing stairs. °· Persistent cough. °· Swelling of the feet, ankles, legs, or abdomen. °· Unexplained weight gain. °· Difficulty breathing when lying flat (orthopnea). °· Waking from sleep because of the need to sit up and get more air. °· Rapid heartbeat. °· Fatigue and loss of energy. °· Feeling light-headed, dizzy, or close to fainting. °· Loss of appetite. °· Nausea. °· Increased urination during the night (nocturia). °DIAGNOSIS  °A diagnosis of heart failure is based on your history, symptoms, physical examination, and diagnostic tests. Diagnostic tests for heart failure may include: °·   Echocardiography. °· Electrocardiography. °· Chest X-ray. °· Blood tests. °· Exercise stress test. °· Cardiac angiography. °· Radionuclide scans. °TREATMENT  °Treatment is aimed at managing the symptoms of heart failure. Medicines, behavioral changes, or surgical intervention may be necessary to  treat heart failure. °· Medicines to help treat heart failure may include: °¨ Angiotensin-converting enzyme (ACE) inhibitors. This type of medicine blocks the effects of a blood protein called angiotensin-converting enzyme. ACE inhibitors relax (dilate) the blood vessels and help lower blood pressure. °¨ Angiotensin receptor blockers (ARBs). This type of medicine blocks the actions of a blood protein called angiotensin. Angiotensin receptor blockers dilate the blood vessels and help lower blood pressure. °¨ Water pills (diuretics). Diuretics cause the kidneys to remove salt and water from the blood. The extra fluid is removed through urination. This loss of extra fluid lowers the volume of blood the heart pumps. °¨ Beta blockers. These prevent the heart from beating too fast and improve heart muscle strength. °¨ Digitalis. This increases the force of the heartbeat. °· Healthy behavior changes include: °¨ Obtaining and maintaining a healthy weight. °¨ Stopping smoking or chewing tobacco. °¨ Eating heart-healthy foods. °¨ Limiting or avoiding alcohol. °¨ Stopping illicit drug use. °¨ Physical activity as directed by your health care provider. °· Surgical treatment for heart failure may include: °¨ A procedure to open blocked arteries, repair damaged heart valves, or remove damaged heart muscle tissue. °¨ A pacemaker to improve heart muscle function and control certain abnormal heart rhythms. °¨ An internal cardioverter defibrillator to treat certain serious abnormal heart rhythms. °¨ A left ventricular assist device (LVAD) to assist the pumping ability of the heart. °HOME CARE INSTRUCTIONS  °· Take medicines only as directed by your health care provider. Medicines are important in reducing the workload of your heart, slowing the progression of heart failure, and improving your symptoms. °¨ Do not stop taking your medicine unless directed by your health care provider. °¨ Do not skip any dose of medicine. °¨ Refill your  prescriptions before you run out of medicine. Your medicines are needed every day. °· Engage in moderate physical activity if directed by your health care provider. Moderate physical activity can benefit some people. The elderly and people with severe heart failure should consult with a health care provider for physical activity recommendations. °· Eat heart-healthy foods. Food choices should be free of trans fat and low in saturated fat, cholesterol, and salt (sodium). Healthy choices include fresh or frozen fruits and vegetables, fish, lean meats, legumes, fat-free or low-fat dairy products, and whole grain or high fiber foods. Talk to a dietitian to learn more about heart-healthy foods. °· Limit sodium if directed by your health care provider. Sodium restriction may reduce symptoms of heart failure in some people. Talk to a dietitian to learn more about heart-healthy seasonings. °· Use healthy cooking methods. Healthy cooking methods include roasting, grilling, broiling, baking, poaching, steaming, or stir-frying. Talk to a dietitian to learn more about healthy cooking methods. °· Limit fluids if directed by your health care provider. Fluid restriction may reduce symptoms of heart failure in some people. °· Weigh yourself every day. Daily weights are important in the early recognition of excess fluid. You should weigh yourself every morning after you urinate and before you eat breakfast. Wear the same amount of clothing each time you weigh yourself. Record your daily weight. Provide your health care provider with your weight record. °· Monitor and record your blood pressure if directed by your health care   provider.  Check your pulse if directed by your health care provider.  Lose weight if directed by your health care provider. Weight loss may reduce symptoms of heart failure in some people.  Stop smoking or chewing tobacco. Nicotine makes your heart work harder by causing your blood vessels to constrict.  Do not use nicotine gum or patches before talking to your health care provider.  Keep all follow-up visits as directed by your health care provider. This is important.  Limit alcohol intake to no more than 1 drink per day for nonpregnant women and 2 drinks per day for men. One drink equals 12 ounces of beer, 5 ounces of wine, or 1 ounces of hard liquor. Drinking more than that is harmful to your heart. Tell your health care provider if you drink alcohol several times a week. Talk with your health care provider about whether alcohol is safe for you. If your heart has already been damaged by alcohol or you have severe heart failure, drinking alcohol should be stopped completely.  Stop illicit drug use.  Stay up-to-date with immunizations. It is especially important to prevent respiratory infections through current pneumococcal and influenza immunizations.  Manage other health conditions such as hypertension, diabetes, thyroid disease, or abnormal heart rhythms as directed by your health care provider.  Learn to manage stress.  Plan rest periods when fatigued.  Learn strategies to manage high temperatures. If the weather is extremely hot:  Avoid vigorous physical activity.  Use air conditioning or fans or seek a cooler location.  Avoid caffeine and alcohol.  Wear loose-fitting, lightweight, and light-colored clothing.  Learn strategies to manage cold temperatures. If the weather is extremely cold:  Avoid vigorous physical activity.  Layer clothes.  Wear mittens or gloves, a hat, and a scarf when going outside.  Avoid alcohol.  Obtain ongoing education and support as needed.  Participate in or seek rehabilitation as needed to maintain or improve independence and quality of life. SEEK MEDICAL CARE IF:   You have a rapid weight gain.  You have increasing shortness of breath that is unusual for you.  You are unable to participate in your usual physical activities.  You tire  easily.  You cough more than normal, especially with physical activity.  You have any or more swelling in areas such as your hands, feet, ankles, or abdomen.  You are unable to sleep because it is hard to breathe.  You feel like your heart is beating fast (palpitations).  You become dizzy or light-headed upon standing up. SEEK IMMEDIATE MEDICAL CARE IF:   You have difficulty breathing.  There is a change in mental status such as decreased alertness or difficulty with concentration.  You have a pain or discomfort in your chest.  You have an episode of fainting (syncope). MAKE SURE YOU:   Understand these instructions.  Will watch your condition.  Will get help right away if you are not doing well or get worse.   This information is not intended to replace advice given to you by your health care provider. Make sure you discuss any questions you have with your health care provider.   Document Released: 09/04/2005 Document Revised: 01/19/2015 Document Reviewed: 10/04/2012 Elsevier Interactive Patient Education 2016 [employer]. Respiratory failure is when your lungs are not working well and your breathing (respiratory) system fails. When respiratory failure occurs, it is difficult for your lungs to get enough oxygen, get rid of carbon dioxide, or both. Respiratory failure can be life threatening.  Respiratory failure can be acute or chronic. Acute respiratory failure is sudden, severe, and requires emergency medical treatment. Chronic respiratory failure is less severe, happens over time, and requires ongoing treatment.  WHAT ARE THE CAUSES OF ACUTE RESPIRATORY FAILURE?  Any problem affecting the heart or lungs can cause acute respiratory failure. Some of these causes include the following:  Chronic bronchitis and emphysema (COPD).   Blood clot going to a lung (pulmonary embolism).   Having water in the lungs caused by heart failure, lung injury, or infection (pulmonary  edema).   Collapsed lung (pneumothorax).   Pneumonia.   Pulmonary fibrosis.   Obesity.   Asthma.   Heart failure.   Any type of trauma to the chest that can make breathing difficult.   Nerve or muscle diseases making chest movements difficult. HOW WILL MY ACUTE RESPIRATORY FAILURE BE TREATED?  Treatment of acute respiratory failure depends on the cause of the respiratory failure. Usually, you will stay in the intensive care unit so your breathing can be watched closely. Treatment can include the following:  Oxygen. Oxygen can be delivered through the following:  Nasal cannula. This is small tubing that goes in your nose to give you oxygen.  Face mask. A face mask covers your nose and mouth to give you oxygen.  Medicine. Different medicines can be given to help with breathing. These can include:  Nebulizers. Nebulizers deliver medicines to open the air passages (bronchodilators). These medicines help to open or relax the airways in the lungs so you can breathe better. They can also help loosen mucus from your lungs.  Diuretics. Diuretic medicines can help you breathe better by getting rid of extra water in your body.  Steroids. Steroid medicines can help decrease swelling (inflammation) in your lungs.  Antibiotics.  Chest tube. If you have a collapsed lung (pneumothorax), a chest tube is placed to help reinflate the lung.  Noninvasive positive pressure ventilation (NPPV). This is a tight-fitting mask that goes over your nose and mouth. The mask has tubing that is attached to a machine. The machine blows air into the tubing, which helps to keep the tiny air sacs (alveoli) in your lungs open. This machine allows you to breathe on your own.  Ventilator. A ventilator is a breathing machine. When on a ventilator, a breathing tube is put into the lungs. A ventilator is used when you can no longer breathe well enough on your own. You may have low oxygen levels or high carbon  dioxide (CO2) levels in your blood. When you are on a ventilator, sedation and pain medicines are given to make you sleep so your lungs can heal. SEEK IMMEDIATE MEDICAL CARE IF:  You have shortness of breath (dyspnea) with or without activity.  You have rapid breathing (tachypnea).  You are wheezing.  You are unable to say more than a few words without having to catch your breath.  You find it very difficult to function normally.  You have a fast heart rate.  You have a bluish color to your finger or toe nail beds.  You have confusion or drowsiness or both.   This information is not intended to replace advice given to you by your health care provider. Make sure you discuss any questions you have with your health care provider.   Document Released: 09/09/2013 Document Revised: 05/26/2015 Document Reviewed: 09/09/2013 Elsevier Interactive Patient Education [employer]

## 2015-11-01 NOTE — Progress Notes (Signed)
  RD consulted for nutrition education regarding hyperglycemia.   Lab Results  Component Value Date   HGBA1C 6.6* 10/29/2015    RD provided "Carbohydrate Counting for People with Diabetes" handout from the Academy of Nutrition and Dietetics. Discussed pt's lab results and encouraged patient to limit intake of added sugars, sweets, desserts etc. in order to better control blood sugar and prevent onset of diabetes.  Discussed different food groups and their effects on blood sugar, emphasizing carbohydrate-containing foods. Provided list of carbohydrates and recommended serving sizes of common foods.  Discussed importance of controlled and consistent carbohydrate intake throughout the day. Provided examples of ways to balance meals/snacks and encouraged intake of high-fiber, whole grain complex carbohydrates. Teach back method used.  Expect good compliance. Pt states that he can eliminate soda from his day and can cut back on added sugar. He generally eats a healthful diet; cooks at home. He already follows a no added salt diet. RD answered pt's question regarding sodium content of foods as well.   Body mass index is 32.62 kg/(m^2). Pt meets criteria for Obesity based on current BMI.  Current diet order is Heart Healthy, patient is consuming approximately 100% of meals at this time. Labs and medications reviewed. No further nutrition interventions warranted at this time. RD contact information provided. If additional nutrition issues arise, please re-consult RD.  Dorothea Ogle RD, LDN Inpatient Clinical Dietitian Pager: [phone] After Hours Pager: [phone]

## 2015-11-02 LAB — CULTURE, BLOOD (ROUTINE X 2)
CULTURE: NO GROWTH
CULTURE: NO GROWTH

## 2015-11-03 ENCOUNTER — Ambulatory Visit: Payer: No Typology Code available for payment source | Admitting: Family Medicine

## 2015-11-07 NOTE — Progress Notes (Signed)
Cardiology Office Note:    Date:  11/08/2015   ID:  Austin Mccarty, DOB 26-Feb-1966, MRN [id number]  PCP:  [name], MD  Cardiologist:  Dr. Tonny Bollman   Electrophysiologist:  n/a  Chief Complaint  Patient presents with  . Hospitalization Follow-up    Admitted with a/c systolic CHF    History of Present Illness:     Austin Mccarty is a 50 y.o. [place] male with a hx of HFrEF, CKD, HTN.  Dilated cardiomyopathy is presumed to be secondary to uncontrolled hypertension. Ejection fraction has been ~ 35%.  Admitted in 1/17 with a/c systolic CHF in the setting of multifocal pneumonia.  He has been followed at the [facility] and [facility] (has seen Dr. Daleen Squibb).  Recently admitted 2/8-2/13 with acute hypoxic respiratory failure secondary to acute on chronic systolic CHF and multifocal pneumonia.  He was followed by cardiology. He was discharged on a combination of carvedilol, furosemide, hydralazine, isosorbide, losartan, spironolactone. He returns for follow-up.   Here alone. Doing well since discharge. Breathing is improved. He denies orthopnea or PND. Cough is improved. LE edema is improved. Denies chest pain or syncope.   Past Medical History  Diagnosis Date  . CKD (chronic kidney disease) stage 2, GFR 60-89 ml/min   . CAP (community acquired pneumonia) 01/08/2015  . Foreign body, eye 2009  . Hypertension     hx/pt 01/08/2015  . Chronic systolic (congestive) heart failure [facility])     Past Surgical History  Procedure Laterality Date  . Eye surgery Right 2009    "removed glass"    Current Medications: Outpatient Prescriptions Prior to Visit  Medication Sig Dispense Refill  . fluticasone (FLONASE) 50 MCG/ACT nasal spray Place 2 sprays into both nostrils daily. 16 g 2  . hydrALAZINE (APRESOLINE) 25 MG tablet Take 1 tablet (25 mg total) by mouth 3 (three) times daily. 60 tablet 2  . isosorbide mononitrate (IMDUR) 30 MG 24 hr tablet Take 1 tablet (30  mg total) by mouth daily. 30 tablet 2  . losartan (COZAAR) 100 MG tablet Take 1 tablet (100 mg total) by mouth daily. 30 tablet 0  . potassium chloride SA (K-DUR,KLOR-CON) 20 MEQ tablet Take 1 tablet (20 mEq total) by mouth daily. 30 tablet 3  . spironolactone (ALDACTONE) 25 MG tablet Take 0.5 tablets (12.5 mg total) by mouth daily. 30 tablet 0  . carvedilol (COREG) 12.5 MG tablet Take 1 tablet (12.5 mg total) by mouth 2 (two) times daily with a meal. 60 tablet 3  . furosemide (LASIX) 80 MG tablet Take 1 tablet (80 mg total) by mouth 2 (two) times daily. (Patient not taking: Reported on 11/08/2015) 60 tablet 0  . levofloxacin (LEVAQUIN) 750 MG tablet Take 1 tablet (750 mg total) by mouth daily. (Patient not taking: Reported on 11/08/2015) 3 tablet 0   No facility-administered medications prior to visit.     Allergies:   Ibuprofen   Social History   Social History  . Marital Status: Married    Spouse Name: N/A  . Number of Children: N/A  . Years of Education: N/A   Social History Main Topics  . Smoking status: Former Smoker -- 1.00 packs/day for 8 years    Types: Cigarettes    Quit date: 10/25/2007  . Smokeless tobacco: Never Used     Comment: "quit smoking in ~ 2009  . Alcohol Use: No  . Drug Use: No  . Sexual Activity: Yes   Other Topics  Concern  . None   Social History Narrative     Family History:  The patient's family history includes Hypertension in his father.   ROS:   Please see the history of present illness.    ROS All other systems reviewed and are negative.   Physical Exam:    VS:  BP 160/78 mmHg  Pulse 86  Ht 6\' 1"  (1.854 m)  Wt 254 lb (115.214 kg)  BMI 33.52 kg/m2   GEN: Well nourished, well developed, in no acute distress HEENT: normal Neck: no JVD, no masses Cardiac: Normal S1/S2, RRR; no murmurs,  no edema;     Respiratory:  clear to auscultation bilaterally; no wheezing, rhonchi or rales GI: soft, nontender  MS: no deformity or atrophy Skin:  warm and dry  Neuro:    no focal deficits  Psych: Alert and oriented x 3, normal affect  Wt Readings from Last 3 Encounters:  11/08/15 254 lb (115.214 kg)  11/01/15 247 lb 3.2 oz (112.129 kg)  10/27/15 261 lb 12.8 oz (118.752 kg)      Studies/Labs Reviewed:     EKG:  EKG is  ordered today.  The ekg ordered today demonstrates NSR, HR 87, normal axis, LVH with repolarization abnormality, T-wave inversions in 1, aVL, V4-V6, no significant change when compared to prior tracings, QTc 459 ms  Recent Labs: 01/12/2015: Pro B Natriuretic peptide (BNP) 2840.00* 10/27/2015: B Natriuretic Peptide 582.5* 10/28/2015: ALT 24; Magnesium 1.7 11/01/2015: BUN 18; Creatinine, Ser 1.36*; Hemoglobin 10.7*; Platelets 323; Potassium 3.8; Sodium 128*; TSH 2.001   Recent Lipid Panel    Component Value Date/Time   CHOL 109 10/29/2015 0420   TRIG 58 10/29/2015 0420   HDL 34* 10/29/2015 0420   CHOLHDL 3.2 10/29/2015 0420   VLDL 12 10/29/2015 0420   LDLCALC 63 10/29/2015 0420    Additional studies/ records that were reviewed today include:   Echo 10/29/15 EF 30-35%, diffuse HK, mild MR, severe LAE, moderate RVE, mild RAE, trivial pericardial effusion  Echo 4/16 Moderate LVH, EF 35-40%, diffuse HK, moderate MR, mild BAE, moderate TR, PASP 59 mmHg   ASSESSMENT:     1. Chronic combined systolic and diastolic congestive heart failure (HCC)   2. Hypertensive heart disease with heart failure (HCC)   3. Chronic kidney disease, stage 2 (mild)   4. Dilated cardiomyopathy (HCC)     PLAN:     In order of problems listed above:  1. Chronic combined systolic and diastolic CHF - Volume stable. He is NYHA 2. He inadvertently took Lasix 40 mg twice a day since discharge. He was discharged on 80 mg twice a day. Volume status has only improved since discharge. He would be a good candidate for Entresto given his multiple recent admissions with acute on chronic CHF and LVEF less than 40%. However, he has no insurance.   I will check with his pharmacy at the [facility] to see if it is covered. If it is not, I will check with the drug company to see if can be covered for him.  Continue current dose of furosemide. Check BMET today. Continue current dose of hydralazine, nitrates, losartan, spironolactone.  2. HTN Heart Disease - BP uncontrolled. Increase Coreg to 25 mg Twice daily.   3. CKD Stage 2 - Check BMET today.  4. Dilated CM - Likely related to HTN.  Will need to repeat Echo once meds optimized.  If EF remains low, consider ischemic evaluation.  Medication Adjustments/Labs and Tests Ordered: Current medicines are reviewed at length with the patient today.  Concerns regarding medicines are outlined above.  Medication changes, Labs and Tests ordered today are outlined in the Patient Instructions noted below. Patient Instructions  Medication Instructions:  1. INCREASE COREG TO 25 MG TWICE DAILY; NEW RX SENT IN  Labwork: TODAY BMET  Testing/Procedures: NONE  Follow-Up: 11/22/15 @ 2 PM WITH Aerin Delany, PAC   Any Other Special Instructions Will Be Listed Below (If Applicable).  If you need a refill on your cardiac medications before your next appointment, please call your pharmacy.    Signed, Tereso Newcomer, PA-C  11/08/2015 1:40 PM    [facility] [address] [place], [place], [place]  [address] Phone: [phone]; Fax: [phone]

## 2015-11-08 ENCOUNTER — Ambulatory Visit (INDEPENDENT_AMBULATORY_CARE_PROVIDER_SITE_OTHER): Payer: No Typology Code available for payment source | Admitting: Physician Assistant

## 2015-11-08 ENCOUNTER — Encounter: Payer: Self-pay | Admitting: Physician Assistant

## 2015-11-08 VITALS — BP 160/78 | HR 86 | Ht 73.0 in | Wt 254.0 lb

## 2015-11-08 DIAGNOSIS — N182 Chronic kidney disease, stage 2 (mild): Secondary | ICD-10-CM

## 2015-11-08 DIAGNOSIS — I110 Hypertensive heart disease with heart failure: Secondary | ICD-10-CM

## 2015-11-08 DIAGNOSIS — I5042 Chronic combined systolic (congestive) and diastolic (congestive) heart failure: Secondary | ICD-10-CM

## 2015-11-08 DIAGNOSIS — I420 Dilated cardiomyopathy: Secondary | ICD-10-CM

## 2015-11-08 MED ORDER — CARVEDILOL 25 MG PO TABS: 25.0000 mg | ORAL_TABLET | Freq: Two times a day (BID) | ORAL | Status: AC

## 2015-11-08 MED FILL — CARVEDILOL 25 MG TABLET: 25 | 30 days supply | Qty: 60 | Fill #0

## 2015-11-08 NOTE — Patient Instructions (Addendum)
Medication Instructions:  1. INCREASE COREG TO 25 MG TWICE DAILY; NEW RX SENT IN  Labwork: TODAY BMET  Testing/Procedures: NONE  Follow-Up: 11/22/15 @ 2 PM WITH SCOTT WEAVER, PAC   Any Other Special Instructions Will Be Listed Below (If Applicable).  If you need a refill on your cardiac medications before your next appointment, please call your pharmacy.

## 2015-11-09 ENCOUNTER — Telehealth: Payer: Self-pay | Admitting: *Deleted

## 2015-11-09 LAB — BASIC METABOLIC PANEL
BUN: 22 mg/dL (ref 7–25)
CHLORIDE: 95 mmol/L — AB (ref 98–110)
CO2: 26 mmol/L (ref 20–31)
Calcium: 8.4 mg/dL — ABNORMAL LOW (ref 8.6–10.3)
Creat: 1.37 mg/dL — ABNORMAL HIGH (ref 0.60–1.35)
Glucose, Bld: 105 mg/dL — ABNORMAL HIGH (ref 65–99)
POTASSIUM: 4.4 mmol/L (ref 3.5–5.3)
SODIUM: 130 mmol/L — AB (ref 135–146)

## 2015-11-09 NOTE — Telephone Encounter (Signed)
Lmptcb for lab results and recommendations on fluid restrictions.

## 2015-11-09 NOTE — Telephone Encounter (Signed)
Lmptcb x 2 to go over lab reuslts and recommendations

## 2015-11-11 NOTE — Telephone Encounter (Signed)
Pt has been notified of lab results and was advised to limit fluids to between 40 and 50 oz. I went over this guideline with the pt as to what this mean. Will repeat BMET 3/6 when he see's Lorin Picket W. PA. Pt verbalized understanding to plan of care.

## 2015-11-15 ENCOUNTER — Encounter: Payer: Self-pay | Admitting: Family Medicine

## 2015-11-15 ENCOUNTER — Ambulatory Visit: Payer: Medicaid Other | Attending: Family Medicine | Admitting: Family Medicine

## 2015-11-15 VITALS — BP 166/101 | HR 86 | Temp 98.5°F | Resp 15 | Ht 73.0 in | Wt 247.8 lb

## 2015-11-15 DIAGNOSIS — R05 Cough: Secondary | ICD-10-CM | POA: Diagnosis not present

## 2015-11-15 DIAGNOSIS — R059 Cough, unspecified: Secondary | ICD-10-CM

## 2015-11-15 DIAGNOSIS — I5042 Chronic combined systolic (congestive) and diastolic (congestive) heart failure: Secondary | ICD-10-CM | POA: Insufficient documentation

## 2015-11-15 DIAGNOSIS — N182 Chronic kidney disease, stage 2 (mild): Secondary | ICD-10-CM | POA: Insufficient documentation

## 2015-11-15 DIAGNOSIS — I129 Hypertensive chronic kidney disease with stage 1 through stage 4 chronic kidney disease, or unspecified chronic kidney disease: Secondary | ICD-10-CM | POA: Insufficient documentation

## 2015-11-15 DIAGNOSIS — I10 Essential (primary) hypertension: Secondary | ICD-10-CM

## 2015-11-15 DIAGNOSIS — E119 Type 2 diabetes mellitus without complications: Secondary | ICD-10-CM | POA: Insufficient documentation

## 2015-11-15 DIAGNOSIS — E1122 Type 2 diabetes mellitus with diabetic chronic kidney disease: Secondary | ICD-10-CM

## 2015-11-15 MED FILL — hydrALAZINE HCL 25 MG TABS: 25 | 20 days supply | Qty: 60 | Fill #1

## 2015-11-15 MED FILL — FUROSEMIDE 80 MG TABLET: 80 | 30 days supply | Qty: 60 | Fill #0

## 2015-11-15 NOTE — Patient Instructions (Signed)
Diabetes Mellitus and Food It is important for you to manage your blood sugar (glucose) level. Your blood glucose level can be greatly affected by what you eat. Eating healthier foods in the appropriate amounts throughout the day at about the same time each day will help you control your blood glucose level. It can also help slow or prevent worsening of your diabetes mellitus. Healthy eating may even help you improve the level of your blood pressure and reach or maintain a healthy weight.  General recommendations for healthful eating and cooking habits include:  Eating meals and snacks regularly. Avoid going long periods of time without eating to lose weight.  Eating a diet that consists mainly of plant-based foods, such as fruits, vegetables, nuts, legumes, and whole grains.  Using low-heat cooking methods, such as baking, instead of high-heat cooking methods, such as deep frying. Work with your dietitian to make sure you understand how to use the Nutrition Facts information on food labels. HOW CAN FOOD AFFECT ME? Carbohydrates Carbohydrates affect your blood glucose level more than any other type of food. Your dietitian will help you determine how many carbohydrates to eat at each meal and teach you how to count carbohydrates. Counting carbohydrates is important to keep your blood glucose at a healthy level, especially if you are using insulin or taking certain medicines for diabetes mellitus. Alcohol Alcohol can cause sudden decreases in blood glucose (hypoglycemia), especially if you use insulin or take certain medicines for diabetes mellitus. Hypoglycemia can be a life-threatening condition. Symptoms of hypoglycemia (sleepiness, dizziness, and disorientation) are similar to symptoms of having too much alcohol.  If your health care provider has given you approval to drink alcohol, do so in moderation and use the following guidelines:  Women should not have more than one drink per day, and men  should not have more than two drinks per day. One drink is equal to:  12 oz of beer.  5 oz of wine.  1 oz of hard liquor.  Do not drink on an empty stomach.  Keep yourself hydrated. Have water, diet soda, or unsweetened iced tea.  Regular soda, juice, and other mixers might contain a lot of carbohydrates and should be counted. WHAT FOODS ARE NOT RECOMMENDED? As you make food choices, it is important to remember that all foods are not the same. Some foods have fewer nutrients per serving than other foods, even though they might have the same number of calories or carbohydrates. It is difficult to get your body what it needs when you eat foods with fewer nutrients. Examples of foods that you should avoid that are high in calories and carbohydrates but low in nutrients include:  Trans fats (most processed foods list trans fats on the Nutrition Facts label).  Regular soda.  Juice.  Candy.  Sweets, such as cake, pie, doughnuts, and cookies.  Fried foods. WHAT FOODS CAN I EAT? Eat nutrient-rich foods, which will nourish your body and keep you healthy. The food you should eat also will depend on several factors, including:  The calories you need.  The medicines you take.  Your weight.  Your blood glucose level.  Your blood pressure level.  Your cholesterol level. You should eat a variety of foods, including:  Protein.  Lean cuts of meat.  Proteins low in saturated fats, such as fish, egg whites, and beans. Avoid processed meats.  Fruits and vegetables.  Fruits and vegetables that may help control blood glucose levels, such as apples, mangoes, and   yams.  Dairy products.  Choose fat-free or low-fat dairy products, such as milk, yogurt, and cheese.  Grains, bread, pasta, and rice.  Choose whole grain products, such as multigrain bread, whole oats, and brown rice. These foods may help control blood pressure.  Fats.  Foods containing healthful fats, such as nuts,  avocado, olive oil, canola oil, and fish. DOES EVERYONE WITH DIABETES MELLITUS HAVE THE SAME MEAL PLAN? Because every person with diabetes mellitus is different, there is not one meal plan that works for everyone. It is very important that you meet with a dietitian who will help you create a meal plan that is just right for you.   This information is not intended to replace advice given to you by your health care provider. Make sure you discuss any questions you have with your health care provider.   Document Released: 06/01/2005 Document Revised: 09/25/2014 Document Reviewed: 08/01/2013 Elsevier Interactive Patient Education 2016 Elsevier Inc.  

## 2015-11-15 NOTE — Progress Notes (Signed)
Subjective:  Patient ID: Austin Mccarty, male    DOB: Dec 27, 1965  Age: 50 y.o. MRN: [id number]  CC: Follow-up   HPI Austin Mccarty is a 50 year old male with a history of CHF, hypertension, CKD who comes into the clinic for a follow-up after hospitalization at [facility] from 10/27/15-11/01/15 for acute hypoxic respiratory failure secondary to multifocal pneumonia and acute on chronic systolic heart failure.  He had been referred from the clinic to the ED due to persistent hypoxemia in the 90s requiring oxygen administration in the clinic as well as dyspnea and worsening pedal edema. Chest x-ray revealed findings concerning for pulmonary edema versus multilobar pneumonia, CT chest without contrast revealed nodular opacities concerning for multifocal pneumonia, a degree of congestive changes. He was placed on IV diuresis and closely followed by the heart failure team, also placed on IV vancomycin and cefepime for pneumonia. Strep pneumonia, urine Legionella antigens , influenza panel were negative. Ace inhibitor was switched to losartan due to persisting cough. 2-D echo revealed EF of 30-35%, diffuse hypokinesis, mild mitral regurg, severely dilated left atrium, mildly dilated right atrium, trivial pericardial effusion. His condition improved and he was subsequently transitioned to oral Lasix and also discharged on a 3 day course of Levaquin.   Since discharge he has been seen by the heart failure clinic for follow-up visit and his carvedilol was increased to 25 mg twice daily.  Interval history: He reports improvement in shortness of breath and he is able to go up a flight of stairs with little to no dyspnea and his pedal edema has resolved however he still has a dry cough and has nasal congestion. Denies orthopnea or paroxysmal nocturnal dyspnea. He has an elevated blood pressure today and is unsure of the medications he should be taking.   Outpatient Prescriptions Prior to Visit    Medication Sig Dispense Refill  . furosemide (LASIX) 40 MG tablet Take 40 mg by mouth 2 (two) times daily.    . hydrALAZINE (APRESOLINE) 25 MG tablet Take 1 tablet (25 mg total) by mouth 3 (three) times daily. 60 tablet 2  . isosorbide mononitrate (IMDUR) 30 MG 24 hr tablet Take 1 tablet (30 mg total) by mouth daily. 30 tablet 2  . carvedilol (COREG) 25 MG tablet Take 1 tablet (25 mg total) by mouth 2 (two) times daily with a meal. (Patient not taking: Reported on 11/15/2015) 180 tablet 3  . fluticasone (FLONASE) 50 MCG/ACT nasal spray Place 2 sprays into both nostrils daily. (Patient not taking: Reported on 11/15/2015) 16 g 2  . losartan (COZAAR) 100 MG tablet Take 1 tablet (100 mg total) by mouth daily. (Patient not taking: Reported on 11/15/2015) 30 tablet 0  . potassium chloride SA (K-DUR,KLOR-CON) 20 MEQ tablet Take 1 tablet (20 mEq total) by mouth daily. (Patient not taking: Reported on 11/15/2015) 30 tablet 3  . spironolactone (ALDACTONE) 25 MG tablet Take 0.5 tablets (12.5 mg total) by mouth daily. (Patient not taking: Reported on 11/15/2015) 30 tablet 0   No facility-administered medications prior to visit.    ROS Review of Systems  Constitutional: Negative for activity change and appetite change.  HENT: Negative for sinus pressure and sore throat.   Eyes: Negative for visual disturbance.  Respiratory: Positive for cough. Negative for chest tightness and shortness of breath.   Cardiovascular: Negative for chest pain and leg swelling.  Gastrointestinal: Negative for abdominal pain, diarrhea, constipation and abdominal distention.  Endocrine: Negative.   Genitourinary: Negative for dysuria.  Musculoskeletal: Negative for myalgias and joint swelling.  Skin: Negative for rash.  Allergic/Immunologic: Negative.   Neurological: Negative for weakness, light-headedness and numbness.  Psychiatric/Behavioral: Negative for suicidal ideas and dysphoric mood.    Objective:  BP 166/101 mmHg   Pulse 86  Temp(Src) 98.5 F (36.9 C)  Resp 15  Ht  (1.854 m)  Wt 247 lb 12.8 oz (112.401 kg)  BMI 32.70 kg/m2  SpO2 98%  BP/Weight 11/15/2015 11/08/2015 11/01/2015  Systolic BP 166 160 144  Diastolic BP 101 78 71  Wt. (Lbs) 247.8 254 247.2  BMI 32.7 33.52 -   Wt Readings from Last 3 Encounters:  11/15/15 247 lb 12.8 oz (112.401 kg)  11/08/15 254 lb (115.214 kg)  11/01/15 247 lb 3.2 oz (112.129 kg)      Physical Exam  Constitutional: He is oriented to person, place, and time. He appears well-developed and well-nourished.  Neck: No JVD present.  Cardiovascular: Normal rate, normal heart sounds and intact distal pulses.   No murmur heard. Pulmonary/Chest: Effort normal and breath sounds normal. He has no wheezes. He has no rales. He exhibits no tenderness.  Abdominal: Soft. Bowel sounds are normal. He exhibits no distension and no mass. There is no tenderness.  Musculoskeletal: Normal range of motion.  Neurological: He is alert and oriented to person, place, and time.  Skin: Skin is warm and dry.    Lab Results  Component Value Date   HGBA1C 6.6* 10/29/2015    CMP Latest Ref Rng 11/08/2015 11/01/2015 10/31/2015  Glucose 65 - 99 mg/dL 161(W) 98 960(A)  BUN 7 - 25 mg/dL Creatinine 0.60 - 1.35 mg/dL 5.40(J) 8.11(B) 1.47(W)  Sodium 135 - 146 mmol/L 130(L) 128(L) 131(L)  Potassium 3.5 - 5.3 mmol/L 4.4 3.8 3.5  Chloride 98 - 110 mmol/L 95(L) 94(L) 94(L)  CO2 20 - 31 mmol/L Calcium 8.6 - 10.3 mg/dL 2.9(F) 6.2(Z) 3.0(Q)  Total Protein 6.5 - 8.1 g/dL - - -  Total Bilirubin 0.3 - 1.2 mg/dL - - -  Alkaline Phos 38 - 126 U/L - - -  AST 15 - 41 U/L - - -  ALT 17 - 63 U/L - - -      Assessment & Plan:   1. Chronic combined systolic and diastolic congestive heart failure (HCC) EF 30-35% from 2-D echo of 10/2015 No evidence of fluid overload at this time; saturating well on room air Weight is stable compared to discharge weight. Advised to limit fluid  intake, daily weight checks and low-sodium diet Followed by the heart failure clinic  2. Hypertension, uncontrolled Due to the fact that he has been on wrong doses of his antihypertensives I have personally reviewed his medications myself: Discontinued Coreg 6.25 and he has been advised to pick up 25 mg from the pharmacy. Losartan is also missing and have advised him to pick that up as well. Advised to discontinue amlodipine. Low-sodium, DASH diet   3. Chronic kidney disease, stage 2 (mild) Avoid nephrotoxic agents Maintaining a balance between euvolemic and preserving renal function will be a challenge given he is on Lasix.  4. Type 2 diabetes mellitus with stage 2 chronic kidney disease, without long-term current use of insulin (HCC) A1c 6.6 On diet control. We'll give prescription for meter and test strip to his next office visit Will consider medications if dietary control is not effective.  5. Cough Pneumonia resolved. Advised to continue antihistamines for postnasal drip.   No  orders of the defined types were placed in this encounter.    Follow-up: Return in about 1 month (around 12/13/2015) for Follow-up of hypertension and diabetes mellitus.    This note has been created with Education officer, environmental. Any transcriptional errors are unintentional.    [name] MD

## 2015-11-15 NOTE — Progress Notes (Signed)
Patient is confused about which meds he is to take and he has different doses of other meds Reports no pain Patient reports cough

## 2015-11-21 NOTE — Progress Notes (Signed)
Cardiology Office Note:    Date:  11/22/2015   ID:  Austin Mccarty, DOB 08/22/1966, MRN [id number]  PCP:  Jaclyn ShaggyEnobong, Amao, MD  Cardiologist:  Dr. Tonny BollmanMichael Cooper   Electrophysiologist:  n/a  Chief Complaint  Patient presents with  . Congestive Heart Failure    follow up    History of Present Illness:     Austin Mccarty is a 50 y.o. [place] male with a hx of HFrEF, CKD, HTN.  Dilated cardiomyopathy is presumed to be secondary to uncontrolled hypertension. Ejection fraction has been ~ 35%.  Admitted in 1/17 with a/c systolic CHF in the setting of multifocal pneumonia.  [facility] (has seen Dr. Daleen SquibbWall).    Admitted 2/8-2/13 with acute hypoxic respiratory failure secondary to acute on chronic systolic CHF and multifocal pneumonia.  He was followed by cardiology. He was discharged on a combination of carvedilol, furosemide, hydralazine, isosorbide, losartan, spironolactone.   Last seen by me 11/08/15. He returns for follow-up.  He is doing well.  The patient denies chest pain, shortness of breath, syncope, orthopnea, PND or significant pedal edema.  His cough is much better. His PCP refilled Lasix 80 mg bid (started taking this 4-5 days ago). When I saw him last, he was on Lasix 40 mg bid.     Past Medical History  Diagnosis Date  . CKD (chronic kidney disease) stage 2, GFR 60-89 ml/min   . CAP (community acquired pneumonia) 01/08/2015  . Foreign body, eye 2009  . Hypertension     hx/pt 01/08/2015  . Chronic systolic (congestive) heart failure [facility](HCC)     Past Surgical History  Procedure Laterality Date  . Eye surgery Right 2009    "removed glass"    Current Medications: Outpatient Prescriptions Prior to Visit  Medication Sig Dispense Refill  . carvedilol (COREG) 25 MG tablet Take 1 tablet (25 mg total) by mouth 2 (two) times daily with a meal. 180 tablet 3  . fluticasone (FLONASE) 50 MCG/ACT nasal spray Place 2  sprays into both nostrils daily. 16 g 2  . losartan (COZAAR) 100 MG tablet Take 1 tablet (100 mg total) by mouth daily. 30 tablet 0  . hydrALAZINE (APRESOLINE) 25 MG tablet Take 1 tablet (25 mg total) by mouth 3 (three) times daily. 60 tablet 2  . isosorbide mononitrate (IMDUR) 30 MG 24 hr tablet Take 1 tablet (30 mg total) by mouth daily. 30 tablet 2  . potassium chloride SA (K-DUR,KLOR-CON) 20 MEQ tablet Take 1 tablet (20 mEq total) by mouth daily. 30 tablet 3  . spironolactone (ALDACTONE) 25 MG tablet Take 0.5 tablets (12.5 mg total) by mouth daily. 30 tablet 0  . furosemide (LASIX) 40 MG tablet Take 40 mg by mouth 2 (two) times daily.     No facility-administered medications prior to visit.     Allergies:   Ibuprofen   Social History   Social History  . Marital Status: Married    Spouse Name: N/A  . Number of Children: N/A  . Years of Education: N/A   Social History Main Topics  . Smoking status: Former Smoker -- 1.00 packs/day for 8 years    Types: Cigarettes    Quit date: 10/25/2007  . Smokeless tobacco: Never Used     Comment: "quit smoking in ~ 2009  . Alcohol Use: No  . Drug Use: No  . Sexual Activity: Yes   Other Topics Concern  .  None   Social History Narrative     Family History:  The patient's family history includes Hypertension in his father.   ROS:   Please see the history of present illness.    Review of Systems  Respiratory: Positive for cough.    All other systems reviewed and are negative.   Physical Exam:    VS:  BP 178/100 mmHg  Pulse 83  Ht  (1.854 m)  Wt 253 lb (114.76 kg)  BMI 33.39 kg/m2   GEN: Well nourished, well developed, in no acute distress HEENT: normal Neck: no JVD, no masses Cardiac: Normal S1/S2, RRR; no murmurs,  no edema;     Respiratory:  clear to auscultation bilaterally; no wheezing, rhonchi or rales GI: soft, nontender  MS: no deformity or atrophy Skin: warm and dry  Neuro:    no focal deficits  Psych: Alert  and oriented x 3, normal affect  Wt Readings from Last 3 Encounters:  11/22/15 253 lb (114.76 kg)  11/15/15 247 lb 12.8 oz (112.401 kg)  11/08/15 254 lb (115.214 kg)      Studies/Labs Reviewed:     EKG:  EKG is not ordered today.  The ekg ordered today demonstrates n/a  Recent Labs: 01/12/2015: Pro B Natriuretic peptide (BNP) 2840.00* 10/27/2015: B Natriuretic Peptide 582.5* 10/28/2015: ALT 24; Magnesium 1.7 11/01/2015: Hemoglobin 10.7*; Platelets 323; TSH 2.001 11/08/2015: BUN 22; Creat 1.37*; Potassium 4.4; Sodium 130*   Recent Lipid Panel    Component Value Date/Time   CHOL 109 10/29/2015 0420   TRIG 58 10/29/2015 0420   HDL 34* 10/29/2015 0420   CHOLHDL 3.2 10/29/2015 0420   VLDL 12 10/29/2015 0420   LDLCALC 63 10/29/2015 0420    Additional studies/ records that were reviewed today include:   Echo 10/29/15 EF 30-35%, diffuse HK, mild MR, severe LAE, moderate RVE, mild RAE, trivial pericardial effusion  Echo 4/16 Moderate LVH, EF 35-40%, diffuse HK, moderate MR, mild BAE, moderate TR, PASP 59 mmHg   ASSESSMENT:     1. Chronic combined systolic and diastolic CHF (congestive heart failure) (HCC)   2. Hypertensive heart disease with heart failure (HCC)   3. Chronic kidney disease, stage 2 (mild)   4. Dilated cardiomyopathy (HCC)     PLAN:     In order of problems listed above:  1. Chronic combined systolic and diastolic CHF - Volume stable. He is NYHA 2.  He would be a good candidate for Entresto given his multiple recent admissions with acute on chronic CHF and LVEF less than 40%. However, he has no insurance and his pharmacy at the [facility] does not cover it.  I will try to see if we can get paperwork submitted. If he can get the drug on discount or for free, I will change his Losartan to Entresto.  Continue hydralazine, nitrates, losartan, spironolactone.  Continue current dose of Lasix (80 mg bid) for now.  Check BMET today.  If  BUN/Creatinine higher, decrease back to 40 mg bid.   2. HTN Heart Disease - BP remains out of control.    -  Increase Spironolactone to 25 mg QD  -  Decrease K+ to 10 mEq QD  -  Increase Hydralazine to 50 mg TID  -  Increase Imdur to 60 mg QD  -  BMET 1 week.   3. CKD Stage 2 - Repeat BMET today.  4. Dilated CM - Likely related to HTN.  Will need  to repeat Echo once meds optimized.  If EF remains low, consider ischemic evaluation.      Medication Adjustments/Labs and Tests Ordered: Current medicines are reviewed at length with the patient today.  Concerns regarding medicines are outlined above.  Medication changes, Labs and Tests ordered today are outlined in the Patient Instructions noted below. Patient Instructions  Medication Instructions:  1. INCREASE SPIRONOLACTONE TO 25 MG DAILY; RX SENT  2. INCREASE HYDRALAZINE TO 50 MG THREE TIMES A DAY; RX SENT  3. INCREASE IMDUR TO 60 MG DAILY; RX SENT  4. DECREASE POTASSIUM TO 10 MEQ DAILY; RX SENT  Labwork: 1. TODAY BMET  2. BMET TO BE DONE ON 3/13  Testing/Procedures: NONE  Follow-Up: 12/13/15 @ 2 PM WITH Oliver Heitzenrater, PAC  Any Other Special Instructions Will Be Listed Below (If Applicable). YOU WILL NEED TO CALL [facility] PATIENT ASSISTANCE PROGRAM TO SEE IF  YOU QUALIFY : [phone]  If you need a refill on your cardiac medications before your next appointment, please call your pharmacy.   Signed, Tereso Newcomer, PA-C  11/22/2015 3:20 PM    [facility] [address] [place], [place], [place]  [address] Phone: [phone]; Fax: [phone]

## 2015-11-22 ENCOUNTER — Ambulatory Visit (INDEPENDENT_AMBULATORY_CARE_PROVIDER_SITE_OTHER): Payer: Self-pay | Admitting: Physician Assistant

## 2015-11-22 ENCOUNTER — Encounter: Payer: Self-pay | Admitting: Physician Assistant

## 2015-11-22 VITALS — BP 178/100 | HR 83 | Ht 73.0 in | Wt 253.0 lb

## 2015-11-22 DIAGNOSIS — I420 Dilated cardiomyopathy: Secondary | ICD-10-CM

## 2015-11-22 DIAGNOSIS — I5042 Chronic combined systolic (congestive) and diastolic (congestive) heart failure: Secondary | ICD-10-CM

## 2015-11-22 DIAGNOSIS — I110 Hypertensive heart disease with heart failure: Secondary | ICD-10-CM

## 2015-11-22 DIAGNOSIS — N182 Chronic kidney disease, stage 2 (mild): Secondary | ICD-10-CM

## 2015-11-22 LAB — BASIC METABOLIC PANEL
BUN: 31 mg/dL — AB (ref 7–25)
CO2: 27 mmol/L (ref 20–31)
CREATININE: 1.68 mg/dL — AB (ref 0.60–1.35)
Calcium: 8.6 mg/dL (ref 8.6–10.3)
Chloride: 96 mmol/L — ABNORMAL LOW (ref 98–110)
Glucose, Bld: 98 mg/dL (ref 65–99)
Potassium: 4 mmol/L (ref 3.5–5.3)
SODIUM: 129 mmol/L — AB (ref 135–146)

## 2015-11-22 MED ORDER — SPIRONOLACTONE 25 MG PO TABS: 25.0000 mg | ORAL_TABLET | Freq: Every day | ORAL | Status: AC

## 2015-11-22 MED ORDER — ISOSORBIDE MONONITRATE ER 60 MG PO TB24: 60.0000 mg | ORAL_TABLET | Freq: Every day | ORAL | Status: AC

## 2015-11-22 MED ORDER — HYDRALAZINE HCL 50 MG PO TABS: 50.0000 mg | ORAL_TABLET | Freq: Three times a day (TID) | ORAL | Status: AC

## 2015-11-22 MED ORDER — POTASSIUM CHLORIDE CRYS ER 10 MEQ PO TBCR: 10.0000 meq | EXTENDED_RELEASE_TABLET | Freq: Every day | ORAL | Status: AC

## 2015-11-22 MED FILL — ISOSORBIDE MN ER 60 MG TAB: 60 | 30 days supply | Qty: 30 | Fill #0

## 2015-11-22 MED FILL — POTASSIUM CL 10 MEQ TAB SA: 10 | 90 days supply | Qty: 90 | Fill #0

## 2015-11-22 MED FILL — ?SPIRONOLACTONE 25 MG TABLE: 25 | 30 days supply | Qty: 30 | Fill #0

## 2015-11-22 MED FILL — hydrALAZINE HCL 50 MG TABS: 50 | 30 days supply | Qty: 90 | Fill #0

## 2015-11-22 NOTE — Patient Instructions (Addendum)
Medication Instructions:  1. INCREASE SPIRONOLACTONE TO 25 MG DAILY; RX SENT  2. INCREASE HYDRALAZINE TO 50 MG THREE TIMES A DAY; RX SENT  3. INCREASE IMDUR TO 60 MG DAILY; RX SENT  4. DECREASE POTASSIUM TO 10 MEQ DAILY; RX SENT  Labwork: 1. TODAY BMET  2. BMET TO BE DONE ON 3/13  Testing/Procedures: NONE  Follow-Up: 12/13/15 @ 2 PM WITH SCOTT WEAVER, PAC  Any Other Special Instructions Will Be Listed Below (If Applicable). YOU WILL NEED TO CALL [facility] PATIENT ASSISTANCE PROGRAM TO SEE IF  YOU QUALIFY : [phone]  If you need a refill on your cardiac medications before your next appointment, please call your pharmacy.

## 2015-11-24 ENCOUNTER — Telehealth: Payer: Self-pay | Admitting: *Deleted

## 2015-11-24 NOTE — Telephone Encounter (Signed)
Pt notified of lab results and to decrease lasix to 80 AM/40 PM, bmet 11/29/15 already scheduled. Pt verbalized understanding to plan of care.

## 2015-11-29 ENCOUNTER — Other Ambulatory Visit (INDEPENDENT_AMBULATORY_CARE_PROVIDER_SITE_OTHER): Payer: Self-pay | Admitting: *Deleted

## 2015-11-29 DIAGNOSIS — N182 Chronic kidney disease, stage 2 (mild): Secondary | ICD-10-CM

## 2015-11-29 DIAGNOSIS — I5042 Chronic combined systolic (congestive) and diastolic (congestive) heart failure: Secondary | ICD-10-CM

## 2015-11-29 DIAGNOSIS — I110 Hypertensive heart disease with heart failure: Secondary | ICD-10-CM

## 2015-11-29 DIAGNOSIS — I420 Dilated cardiomyopathy: Secondary | ICD-10-CM

## 2015-11-29 LAB — BASIC METABOLIC PANEL
BUN: 25 mg/dL (ref 7–25)
CALCIUM: 8.3 mg/dL — AB (ref 8.6–10.3)
CHLORIDE: 93 mmol/L — AB (ref 98–110)
CO2: 26 mmol/L (ref 20–31)
CREATININE: 1.61 mg/dL — AB (ref 0.60–1.35)
Glucose, Bld: 134 mg/dL — ABNORMAL HIGH (ref 65–99)
POTASSIUM: 3.9 mmol/L (ref 3.5–5.3)
SODIUM: 130 mmol/L — AB (ref 135–146)

## 2015-12-12 ENCOUNTER — Encounter (HOSPITAL_COMMUNITY): Payer: Self-pay | Admitting: Neurology

## 2015-12-12 ENCOUNTER — Emergency Department (HOSPITAL_COMMUNITY): Payer: Medicaid Other

## 2015-12-12 ENCOUNTER — Inpatient Hospital Stay (HOSPITAL_COMMUNITY): Payer: Medicaid Other

## 2015-12-12 ENCOUNTER — Inpatient Hospital Stay (HOSPITAL_COMMUNITY)
Admission: EM | Admit: 2015-12-12 | Discharge: 2016-01-17 | DRG: 003 | Disposition: E | Payer: Medicaid Other | Attending: Pulmonary Disease | Admitting: Pulmonary Disease

## 2015-12-12 DIAGNOSIS — R402321 Coma scale, best motor response, extension, in the field [EMT or ambulance]: Secondary | ICD-10-CM | POA: Diagnosis present

## 2015-12-12 DIAGNOSIS — I615 Nontraumatic intracerebral hemorrhage, intraventricular: Principal | ICD-10-CM | POA: Diagnosis present

## 2015-12-12 DIAGNOSIS — R001 Bradycardia, unspecified: Secondary | ICD-10-CM | POA: Diagnosis not present

## 2015-12-12 DIAGNOSIS — J9621 Acute and chronic respiratory failure with hypoxia: Secondary | ICD-10-CM | POA: Diagnosis present

## 2015-12-12 DIAGNOSIS — E669 Obesity, unspecified: Secondary | ICD-10-CM | POA: Diagnosis present

## 2015-12-12 DIAGNOSIS — I469 Cardiac arrest, cause unspecified: Secondary | ICD-10-CM | POA: Diagnosis present

## 2015-12-12 DIAGNOSIS — R402212 Coma scale, best verbal response, none, at arrival to emergency department: Secondary | ICD-10-CM | POA: Diagnosis present

## 2015-12-12 DIAGNOSIS — G911 Obstructive hydrocephalus: Secondary | ICD-10-CM

## 2015-12-12 DIAGNOSIS — I5042 Chronic combined systolic (congestive) and diastolic (congestive) heart failure: Secondary | ICD-10-CM | POA: Diagnosis present

## 2015-12-12 DIAGNOSIS — R403 Persistent vegetative state: Secondary | ICD-10-CM | POA: Diagnosis present

## 2015-12-12 DIAGNOSIS — E871 Hypo-osmolality and hyponatremia: Secondary | ICD-10-CM | POA: Diagnosis present

## 2015-12-12 DIAGNOSIS — Z930 Tracheostomy status: Secondary | ICD-10-CM

## 2015-12-12 DIAGNOSIS — Z8249 Family history of ischemic heart disease and other diseases of the circulatory system: Secondary | ICD-10-CM

## 2015-12-12 DIAGNOSIS — J9690 Respiratory failure, unspecified, unspecified whether with hypoxia or hypercapnia: Secondary | ICD-10-CM

## 2015-12-12 DIAGNOSIS — J9622 Acute and chronic respiratory failure with hypercapnia: Secondary | ICD-10-CM | POA: Diagnosis present

## 2015-12-12 DIAGNOSIS — I429 Cardiomyopathy, unspecified: Secondary | ICD-10-CM | POA: Diagnosis present

## 2015-12-12 DIAGNOSIS — Z452 Encounter for adjustment and management of vascular access device: Secondary | ICD-10-CM

## 2015-12-12 DIAGNOSIS — Z886 Allergy status to analgesic agent status: Secondary | ICD-10-CM | POA: Diagnosis not present

## 2015-12-12 DIAGNOSIS — R000 Tachycardia, unspecified: Secondary | ICD-10-CM | POA: Diagnosis not present

## 2015-12-12 DIAGNOSIS — Z9289 Personal history of other medical treatment: Secondary | ICD-10-CM

## 2015-12-12 DIAGNOSIS — R509 Fever, unspecified: Secondary | ICD-10-CM | POA: Diagnosis not present

## 2015-12-12 DIAGNOSIS — G253 Myoclonus: Secondary | ICD-10-CM

## 2015-12-12 DIAGNOSIS — S0633AA Contusion and laceration of cerebrum, unspecified, with loss of consciousness status unknown, initial encounter: Secondary | ICD-10-CM

## 2015-12-12 DIAGNOSIS — R739 Hyperglycemia, unspecified: Secondary | ICD-10-CM | POA: Diagnosis present

## 2015-12-12 DIAGNOSIS — Z87891 Personal history of nicotine dependence: Secondary | ICD-10-CM

## 2015-12-12 DIAGNOSIS — Z6833 Body mass index (BMI) 33.0-33.9, adult: Secondary | ICD-10-CM

## 2015-12-12 DIAGNOSIS — I130 Hypertensive heart and chronic kidney disease with heart failure and stage 1 through stage 4 chronic kidney disease, or unspecified chronic kidney disease: Secondary | ICD-10-CM | POA: Diagnosis present

## 2015-12-12 DIAGNOSIS — J9600 Acute respiratory failure, unspecified whether with hypoxia or hypercapnia: Secondary | ICD-10-CM

## 2015-12-12 DIAGNOSIS — Z79899 Other long term (current) drug therapy: Secondary | ICD-10-CM

## 2015-12-12 DIAGNOSIS — N179 Acute kidney failure, unspecified: Secondary | ICD-10-CM | POA: Diagnosis present

## 2015-12-12 DIAGNOSIS — J690 Pneumonitis due to inhalation of food and vomit: Secondary | ICD-10-CM | POA: Diagnosis present

## 2015-12-12 DIAGNOSIS — I161 Hypertensive emergency: Secondary | ICD-10-CM | POA: Diagnosis present

## 2015-12-12 DIAGNOSIS — G931 Anoxic brain damage, not elsewhere classified: Secondary | ICD-10-CM

## 2015-12-12 DIAGNOSIS — I619 Nontraumatic intracerebral hemorrhage, unspecified: Secondary | ICD-10-CM | POA: Insufficient documentation

## 2015-12-12 DIAGNOSIS — Z515 Encounter for palliative care: Secondary | ICD-10-CM | POA: Insufficient documentation

## 2015-12-12 DIAGNOSIS — R680 Hypothermia, not associated with low environmental temperature: Secondary | ICD-10-CM | POA: Diagnosis present

## 2015-12-12 DIAGNOSIS — A419 Sepsis, unspecified organism: Secondary | ICD-10-CM | POA: Insufficient documentation

## 2015-12-12 DIAGNOSIS — I959 Hypotension, unspecified: Secondary | ICD-10-CM | POA: Diagnosis not present

## 2015-12-12 DIAGNOSIS — G935 Compression of brain: Secondary | ICD-10-CM | POA: Diagnosis present

## 2015-12-12 DIAGNOSIS — G8194 Hemiplegia, unspecified affecting left nondominant side: Secondary | ICD-10-CM | POA: Diagnosis present

## 2015-12-12 DIAGNOSIS — R402112 Coma scale, eyes open, never, at arrival to emergency department: Secondary | ICD-10-CM | POA: Diagnosis present

## 2015-12-12 DIAGNOSIS — R471 Dysarthria and anarthria: Secondary | ICD-10-CM | POA: Diagnosis present

## 2015-12-12 DIAGNOSIS — S06360A Traumatic hemorrhage of cerebrum, unspecified, without loss of consciousness, initial encounter: Secondary | ICD-10-CM

## 2015-12-12 DIAGNOSIS — D649 Anemia, unspecified: Secondary | ICD-10-CM | POA: Diagnosis present

## 2015-12-12 DIAGNOSIS — G40901 Epilepsy, unspecified, not intractable, with status epilepticus: Secondary | ICD-10-CM | POA: Diagnosis present

## 2015-12-12 DIAGNOSIS — R58 Hemorrhage, not elsewhere classified: Secondary | ICD-10-CM | POA: Insufficient documentation

## 2015-12-12 DIAGNOSIS — G9382 Brain death: Secondary | ICD-10-CM | POA: Insufficient documentation

## 2015-12-12 DIAGNOSIS — I639 Cerebral infarction, unspecified: Secondary | ICD-10-CM

## 2015-12-12 DIAGNOSIS — E872 Acidosis: Secondary | ICD-10-CM | POA: Diagnosis present

## 2015-12-12 DIAGNOSIS — N182 Chronic kidney disease, stage 2 (mild): Secondary | ICD-10-CM | POA: Diagnosis present

## 2015-12-12 DIAGNOSIS — G936 Cerebral edema: Secondary | ICD-10-CM | POA: Diagnosis present

## 2015-12-12 DIAGNOSIS — J9620 Acute and chronic respiratory failure, unspecified whether with hypoxia or hypercapnia: Secondary | ICD-10-CM

## 2015-12-12 DIAGNOSIS — E785 Hyperlipidemia, unspecified: Secondary | ICD-10-CM | POA: Diagnosis present

## 2015-12-12 DIAGNOSIS — R1310 Dysphagia, unspecified: Secondary | ICD-10-CM | POA: Diagnosis present

## 2015-12-12 DIAGNOSIS — I629 Nontraumatic intracranial hemorrhage, unspecified: Secondary | ICD-10-CM

## 2015-12-12 DIAGNOSIS — R4020 Unspecified coma: Secondary | ICD-10-CM | POA: Diagnosis present

## 2015-12-12 DIAGNOSIS — I10 Essential (primary) hypertension: Secondary | ICD-10-CM | POA: Insufficient documentation

## 2015-12-12 DIAGNOSIS — R4182 Altered mental status, unspecified: Secondary | ICD-10-CM

## 2015-12-12 LAB — I-STAT ARTERIAL BLOOD GAS, ED
ACID-BASE DEFICIT: 3 mmol/L — AB (ref 0.0–2.0)
Acid-base deficit: 10 mmol/L — ABNORMAL HIGH (ref 0.0–2.0)
Bicarbonate: 23.6 mEq/L (ref 20.0–24.0)
Bicarbonate: 27 mEq/L — ABNORMAL HIGH (ref 20.0–24.0)
O2 SAT: 93 %
O2 Saturation: 79 %
PCO2 ART: 93.7 mmHg — AB (ref 35.0–45.0)
PH ART: 7.009 — AB (ref 7.350–7.450)
PH ART: 7.228 — AB (ref 7.350–7.450)
PO2 ART: 67 mmHg — AB (ref 80.0–100.0)
Patient temperature: 98.6
TCO2: 26 mmol/L (ref 0–100)
TCO2: 29 mmol/L (ref 0–100)
pCO2 arterial: 64 mmHg (ref 35.0–45.0)
pO2, Arterial: 76 mmHg — ABNORMAL LOW (ref 80.0–100.0)

## 2015-12-12 LAB — I-STAT CG4 LACTIC ACID, ED
LACTIC ACID, VENOUS: 5.16 mmol/L — AB (ref 0.5–2.0)
Lactic Acid, Venous: 2.23 mmol/L (ref 0.5–2.0)

## 2015-12-12 LAB — URINE MICROSCOPIC-ADD ON

## 2015-12-12 LAB — COMPREHENSIVE METABOLIC PANEL
ALBUMIN: 2.5 g/dL — AB (ref 3.5–5.0)
ALK PHOS: 48 U/L (ref 38–126)
ALT: 47 U/L (ref 17–63)
AST: 41 U/L (ref 15–41)
Anion gap: 9 (ref 5–15)
BILIRUBIN TOTAL: 0.5 mg/dL (ref 0.3–1.2)
BUN: 30 mg/dL — AB (ref 6–20)
CO2: 26 mmol/L (ref 22–32)
CREATININE: 1.78 mg/dL — AB (ref 0.61–1.24)
Calcium: 7.9 mg/dL — ABNORMAL LOW (ref 8.9–10.3)
Chloride: 97 mmol/L — ABNORMAL LOW (ref 101–111)
GFR calc Af Amer: 51 mL/min — ABNORMAL LOW (ref >60)
GFR, EST NON AFRICAN AMERICAN: 44 mL/min — AB (ref >60)
GLUCOSE: 237 mg/dL — AB (ref 65–99)
POTASSIUM: 3.6 mmol/L (ref 3.5–5.1)
Sodium: 132 mmol/L — ABNORMAL LOW (ref 135–145)
TOTAL PROTEIN: 5.2 g/dL — AB (ref 6.5–8.1)

## 2015-12-12 LAB — CBC WITH DIFFERENTIAL/PLATELET
BASOS PCT: 0 %
Basophils Absolute: 0 10*3/uL (ref 0.0–0.1)
EOS PCT: 1 %
Eosinophils Absolute: 0.2 10*3/uL (ref 0.0–0.7)
HEMATOCRIT: 40.1 % (ref 39.0–52.0)
HEMOGLOBIN: 13 g/dL (ref 13.0–17.0)
LYMPHS PCT: 40 %
Lymphs Abs: 6.8 10*3/uL — ABNORMAL HIGH (ref 0.7–4.0)
MCH: 22.6 pg — ABNORMAL LOW (ref 26.0–34.0)
MCHC: 32.4 g/dL (ref 30.0–36.0)
MCV: 69.7 fL — AB (ref 78.0–100.0)
MONOS PCT: 5 %
Monocytes Absolute: 0.9 10*3/uL (ref 0.1–1.0)
NEUTROS PCT: 54 %
Neutro Abs: 9.1 10*3/uL — ABNORMAL HIGH (ref 1.7–7.7)
Platelets: 289 10*3/uL (ref 150–400)
RBC: 5.75 MIL/uL (ref 4.22–5.81)
RDW: 17.9 % — AB (ref 11.5–15.5)
WBC: 17 10*3/uL — AB (ref 4.0–10.5)

## 2015-12-12 LAB — CBC
HEMATOCRIT: 43 % (ref 39.0–52.0)
Hemoglobin: 13.7 g/dL (ref 13.0–17.0)
MCH: 21.7 pg — AB (ref 26.0–34.0)
MCHC: 31.9 g/dL (ref 30.0–36.0)
MCV: 68 fL — AB (ref 78.0–100.0)
Platelets: 321 10*3/uL (ref 150–400)
RBC: 6.32 MIL/uL — AB (ref 4.22–5.81)
RDW: 17.6 % — ABNORMAL HIGH (ref 11.5–15.5)
WBC: 17 10*3/uL — AB (ref 4.0–10.5)

## 2015-12-12 LAB — CBG MONITORING, ED: GLUCOSE-CAPILLARY: 239 mg/dL — AB (ref 65–99)

## 2015-12-12 LAB — INFLUENZA PANEL BY PCR (TYPE A & B)
H1N1FLUPCR: NOT DETECTED
Influenza A By PCR: NEGATIVE
Influenza B By PCR: NEGATIVE

## 2015-12-12 LAB — MRSA PCR SCREENING: MRSA BY PCR: NEGATIVE

## 2015-12-12 LAB — RAPID URINE DRUG SCREEN, HOSP PERFORMED
Amphetamines: NOT DETECTED
BARBITURATES: NOT DETECTED
Benzodiazepines: NOT DETECTED
COCAINE: NOT DETECTED
Opiates: NOT DETECTED
TETRAHYDROCANNABINOL: NOT DETECTED

## 2015-12-12 LAB — I-STAT TROPONIN, ED: Troponin i, poc: 0.01 ng/mL (ref 0.00–0.08)

## 2015-12-12 LAB — GLUCOSE, CAPILLARY
GLUCOSE-CAPILLARY: 57 mg/dL — AB (ref 65–99)
GLUCOSE-CAPILLARY: 72 mg/dL (ref 65–99)
GLUCOSE-CAPILLARY: 111 mg/dL — AB (ref 65–99)
GLUCOSE-CAPILLARY: 97 mg/dL (ref 65–99)
GLUCOSE-CAPILLARY: 123 mg/dL — AB (ref 65–99)
Glucose-Capillary: 206 mg/dL — ABNORMAL HIGH (ref 65–99)
Glucose-Capillary: 92 mg/dL (ref 65–99)

## 2015-12-12 LAB — PROTIME-INR
INR: 1.14 (ref 0.00–1.49)
Prothrombin Time: 14.8 seconds (ref 11.6–15.2)

## 2015-12-12 LAB — BASIC METABOLIC PANEL
ANION GAP: 11 (ref 5–15)
BUN: 31 mg/dL — AB (ref 6–20)
CO2: 25 mmol/L (ref 22–32)
Calcium: 8.2 mg/dL — ABNORMAL LOW (ref 8.9–10.3)
Chloride: 98 mmol/L — ABNORMAL LOW (ref 101–111)
Creatinine, Ser: 1.78 mg/dL — ABNORMAL HIGH (ref 0.61–1.24)
GFR calc Af Amer: 51 mL/min — ABNORMAL LOW (ref >60)
GFR calc non Af Amer: 44 mL/min — ABNORMAL LOW (ref >60)
GLUCOSE: 262 mg/dL — AB (ref 65–99)
POTASSIUM: 3.9 mmol/L (ref 3.5–5.1)
Sodium: 134 mmol/L — ABNORMAL LOW (ref 135–145)

## 2015-12-12 LAB — SODIUM
Sodium: 133 mmol/L — ABNORMAL LOW (ref 135–145)
Sodium: 137 mmol/L (ref 135–145)

## 2015-12-12 LAB — URINALYSIS, ROUTINE W REFLEX MICROSCOPIC
Bilirubin Urine: NEGATIVE
Glucose, UA: 250 mg/dL — AB
KETONES UR: NEGATIVE mg/dL
LEUKOCYTES UA: NEGATIVE
NITRITE: NEGATIVE
PH: 7 (ref 5.0–8.0)
Specific Gravity, Urine: 1.019 (ref 1.005–1.030)

## 2015-12-12 LAB — APTT: APTT: 32 s (ref 24–37)

## 2015-12-12 LAB — OSMOLALITY
OSMOLALITY: 296 mosm/kg — AB (ref 275–295)
OSMOLALITY: 308 mosm/kg — AB (ref 275–295)
OSMOLALITY: 302 mosm/kg — AB (ref 275–295)
Osmolality: 295 mOsm/kg (ref 275–295)
Osmolality: 297 mOsm/kg — ABNORMAL HIGH (ref 275–295)

## 2015-12-12 LAB — PHOSPHORUS: PHOSPHORUS: 6.4 mg/dL — AB (ref 2.5–4.6)

## 2015-12-12 LAB — PROCALCITONIN: Procalcitonin: 6.55 ng/mL

## 2015-12-12 LAB — STREP PNEUMONIAE URINARY ANTIGEN: STREP PNEUMO URINARY ANTIGEN: NEGATIVE

## 2015-12-12 LAB — MAGNESIUM: Magnesium: 1.9 mg/dL (ref 1.7–2.4)

## 2015-12-12 LAB — TRIGLYCERIDES: Triglycerides: 41 mg/dL (ref <150)

## 2015-12-12 MED ORDER — VANCOMYCIN HCL 10 G IV SOLR: 1250.0000 mg | INTRAVENOUS | Status: DC

## 2015-12-12 MED ORDER — MANNITOL 20 % IV SOLN
100.0000 g | Freq: Once | INTRAVENOUS | Status: AC
  Administered 2015-12-12: 100 g via INTRAVENOUS
  Filled 2015-12-12: qty 500

## 2015-12-12 MED ORDER — NALOXONE HCL 2 MG/2ML IJ SOSY
PREFILLED_SYRINGE | INTRAMUSCULAR | Status: AC
  Filled 2015-12-12: qty 2

## 2015-12-12 MED ORDER — NICARDIPINE HCL IN NACL 20-0.86 MG/200ML-% IV SOLN
INTRAVENOUS | Status: AC
  Filled 2015-12-12: qty 200

## 2015-12-12 MED ORDER — PIPERACILLIN-TAZOBACTAM 3.375 G IVPB 30 MIN
3.3750 g | Freq: Once | INTRAVENOUS | Status: AC
  Administered 2015-12-12: 3.375 g via INTRAVENOUS
  Filled 2015-12-12: qty 50

## 2015-12-12 MED ORDER — ANTISEPTIC ORAL RINSE SOLUTION (CORINZ)
7.0000 mL | OROMUCOSAL | Status: DC
  Administered 2015-12-12 – 2015-12-27 (×143): 7 mL via OROMUCOSAL

## 2015-12-12 MED ORDER — MANNITOL 25 % IV SOLN
25.0000 g | Freq: Four times a day (QID) | INTRAVENOUS | Status: DC
  Administered 2015-12-12 (×2): 25 g via INTRAVENOUS
  Filled 2015-12-12: qty 100
  Filled 2015-12-12: qty 50
  Filled 2015-12-12 (×2): qty 100
  Filled 2015-12-12: qty 50
  Filled 2015-12-12 (×2): qty 100

## 2015-12-12 MED ORDER — INSULIN ASPART 100 UNIT/ML ~~LOC~~ SOLN
0.0000 [IU] | SUBCUTANEOUS | Status: DC
  Administered 2015-12-12: 7 [IU] via SUBCUTANEOUS
  Administered 2015-12-13 – 2015-12-18 (×19): 3 [IU] via SUBCUTANEOUS
  Administered 2015-12-18: 4 [IU] via SUBCUTANEOUS
  Administered 2015-12-19 – 2015-12-22 (×14): 3 [IU] via SUBCUTANEOUS

## 2015-12-12 MED ORDER — CHLORHEXIDINE GLUCONATE 0.12% ORAL RINSE (MEDLINE KIT)
15.0000 mL | Freq: Two times a day (BID) | OROMUCOSAL | Status: DC
  Administered 2015-12-12 – 2015-12-26 (×30): 15 mL via OROMUCOSAL

## 2015-12-12 MED ORDER — SODIUM CHLORIDE 0.9 % IV SOLN
23.0000 mg/h | INTRAVENOUS | Status: DC
  Administered 2015-12-12: 23 mg/h via INTRAVENOUS
  Filled 2015-12-12 (×2): qty 10

## 2015-12-12 MED ORDER — LORAZEPAM 2 MG/ML IJ SOLN
INTRAMUSCULAR | Status: AC
  Administered 2015-12-12: 2 mg
  Filled 2015-12-12: qty 1

## 2015-12-12 MED ORDER — LORAZEPAM 2 MG/ML IJ SOLN
INTRAMUSCULAR | Status: AC
  Administered 2015-12-12: 2 mg via INTRAVENOUS
  Filled 2015-12-12: qty 1

## 2015-12-12 MED ORDER — NOREPINEPHRINE BITARTRATE 1 MG/ML IV SOLN
0.0000 ug/min | Freq: Once | INTRAVENOUS | Status: DC
  Filled 2015-12-12: qty 4

## 2015-12-12 MED ORDER — SODIUM BICARBONATE 8.4 % IV SOLN
INTRAVENOUS | Status: AC | PRN
  Administered 2015-12-12 (×2): 50 meq via INTRAVENOUS

## 2015-12-12 MED ORDER — NALOXONE HCL 2 MG/2ML IJ SOSY
PREFILLED_SYRINGE | INTRAMUSCULAR | Status: AC | PRN
  Administered 2015-12-12: 2 mg via INTRAVENOUS

## 2015-12-12 MED ORDER — FUROSEMIDE 10 MG/ML IJ SOLN
100.0000 mg | Freq: Once | INTRAVENOUS | Status: AC
  Administered 2015-12-12: 100 mg via INTRAVENOUS
  Filled 2015-12-12: qty 10

## 2015-12-12 MED ORDER — PANTOPRAZOLE SODIUM 40 MG IV SOLR
40.0000 mg | INTRAVENOUS | Status: DC
  Administered 2015-12-12 – 2015-12-13 (×2): 40 mg via INTRAVENOUS
  Filled 2015-12-12 (×2): qty 40

## 2015-12-12 MED ORDER — SODIUM CHLORIDE 0.9 % IV SOLN
1000.0000 mg | Freq: Two times a day (BID) | INTRAVENOUS | Status: DC
  Administered 2015-12-12: 1000 mg via INTRAVENOUS
  Filled 2015-12-12 (×3): qty 10

## 2015-12-12 MED ORDER — NICARDIPINE HCL IN NACL 20-0.86 MG/200ML-% IV SOLN
3.0000 mg/h | INTRAVENOUS | Status: DC
  Administered 2015-12-12: 8 mg/h via INTRAVENOUS
  Administered 2015-12-13: 5 mg/h via INTRAVENOUS
  Administered 2015-12-13: 15 mg/h via INTRAVENOUS
  Administered 2015-12-14: 7.5 mg/h via INTRAVENOUS
  Administered 2015-12-14 (×3): 12.5 mg/h via INTRAVENOUS
  Filled 2015-12-12: qty 200
  Filled 2015-12-12: qty 400
  Filled 2015-12-12 (×4): qty 200

## 2015-12-12 MED ORDER — VALPROATE SODIUM 500 MG/5ML IV SOLN
1000.0000 mg | INTRAVENOUS | Status: AC
  Administered 2015-12-12: 1000 mg via INTRAVENOUS
  Filled 2015-12-12: qty 10

## 2015-12-12 MED ORDER — PROPOFOL 1000 MG/100ML IV EMUL
INTRAVENOUS | Status: AC
  Administered 2015-12-12: 1000 mg
  Filled 2015-12-12: qty 100

## 2015-12-12 MED ORDER — SODIUM CHLORIDE 0.9 % IV SOLN
30.0000 mg/h | INTRAVENOUS | Status: DC
  Administered 2015-12-12 – 2015-12-13 (×6): 23 mg/h via INTRAVENOUS
  Administered 2015-12-14: 30 mg/h via INTRAVENOUS
  Administered 2015-12-14: 29 mg/h via INTRAVENOUS
  Administered 2015-12-14: 23 mg/h via INTRAVENOUS
  Administered 2015-12-14: 27 mg/h via INTRAVENOUS
  Administered 2015-12-15: 24 mg/h via INTRAVENOUS
  Administered 2015-12-15: 7 mg/h via INTRAVENOUS
  Filled 2015-12-12 (×16): qty 20

## 2015-12-12 MED ORDER — VANCOMYCIN HCL IN DEXTROSE 1-5 GM/200ML-% IV SOLN
1000.0000 mg | Freq: Once | INTRAVENOUS | Status: AC
  Administered 2015-12-12: 1000 mg via INTRAVENOUS
  Filled 2015-12-12: qty 200

## 2015-12-12 MED ORDER — PIPERACILLIN-TAZOBACTAM 3.375 G IVPB
3.3750 g | Freq: Three times a day (TID) | INTRAVENOUS | Status: DC
  Administered 2015-12-12 – 2015-12-22 (×30): 3.375 g via INTRAVENOUS
  Filled 2015-12-12 (×33): qty 50

## 2015-12-12 MED ORDER — NICARDIPINE HCL IN NACL 20-0.86 MG/200ML-% IV SOLN
3.0000 mg/h | Freq: Once | INTRAVENOUS | Status: AC
  Administered 2015-12-12: 3 mg/h via INTRAVENOUS

## 2015-12-12 MED ORDER — SODIUM CHLORIDE 0.9 % IV SOLN: INTRAVENOUS | Status: DC | PRN

## 2015-12-12 MED ORDER — SODIUM BICARBONATE 8.4 % IV SOLN
50.0000 meq | Freq: Once | INTRAVENOUS | Status: AC
  Administered 2015-12-12: 50 meq via INTRAVENOUS

## 2015-12-12 MED ORDER — ACETAMINOPHEN 160 MG/5ML PO SOLN
650.0000 mg | Freq: Four times a day (QID) | ORAL | Status: DC | PRN
  Administered 2015-12-12 – 2015-12-15 (×7): 650 mg
  Administered 2015-12-24: 17:00:00
  Administered 2015-12-25 (×2): 650 mg
  Filled 2015-12-12 (×11): qty 20.3

## 2015-12-12 MED ORDER — EPINEPHRINE HCL 0.1 MG/ML IJ SOSY
PREFILLED_SYRINGE | INTRAMUSCULAR | Status: AC | PRN
  Administered 2015-12-12 (×4): 1 via INTRAVENOUS

## 2015-12-12 MED ORDER — VANCOMYCIN HCL 10 G IV SOLR
1750.0000 mg | Freq: Once | INTRAVENOUS | Status: AC
  Administered 2015-12-12: 1750 mg via INTRAVENOUS
  Filled 2015-12-12: qty 1750

## 2015-12-12 MED ORDER — CARVEDILOL 12.5 MG PO TABS: 25.0000 mg | ORAL_TABLET | Freq: Two times a day (BID) | ORAL | Status: DC

## 2015-12-12 MED ORDER — ETOMIDATE 2 MG/ML IV SOLN
INTRAVENOUS | Status: AC | PRN
Start: 2015-12-12 — End: 2015-12-12
  Administered 2015-12-12: 20 mg via INTRAVENOUS

## 2015-12-12 MED ORDER — DEXTROSE 50 % IV SOLN
INTRAVENOUS | Status: AC
  Administered 2015-12-12: 25 mL
  Filled 2015-12-12: qty 50

## 2015-12-12 MED ORDER — HEPARIN (PORCINE) IN NACL 100-0.45 UNIT/ML-% IJ SOLN
1500.0000 [IU]/h | INTRAMUSCULAR | Status: DC
  Filled 2015-12-12: qty 250

## 2015-12-12 MED ORDER — MANNITOL 20 % IV SOLN
25.0000 g | Freq: Four times a day (QID) | INTRAVENOUS | Status: DC
  Administered 2015-12-12 – 2015-12-13 (×2): 25 g via INTRAVENOUS
  Filled 2015-12-12 (×5): qty 500

## 2015-12-12 MED ORDER — LORAZEPAM 2 MG/ML IJ SOLN
1.0000 mg | INTRAMUSCULAR | Status: DC | PRN
  Administered 2015-12-12 – 2015-12-16 (×2): 2 mg via INTRAVENOUS
  Filled 2015-12-12: qty 1

## 2015-12-12 MED ORDER — PROPOFOL 1000 MG/100ML IV EMUL
5.0000 ug/kg/min | INTRAVENOUS | Status: DC
  Administered 2015-12-12: 40 ug/kg/min via INTRAVENOUS
  Administered 2015-12-12: 25 ug/kg/min via INTRAVENOUS
  Filled 2015-12-12 (×2): qty 100

## 2015-12-12 MED ORDER — SODIUM CHLORIDE 0.9 % IV SOLN
250.0000 mL | INTRAVENOUS | Status: DC | PRN
  Administered 2015-12-25: 10 mL/h via INTRAVENOUS

## 2015-12-12 MED ORDER — NITROGLYCERIN IN D5W 200-5 MCG/ML-% IV SOLN
0.0000 ug/min | Freq: Once | INTRAVENOUS | Status: DC
Start: 2015-12-12 — End: 2015-12-12
  Filled 2015-12-12: qty 250

## 2015-12-12 MED ORDER — SODIUM CHLORIDE 0.9 % IV SOLN
1500.0000 mg | Freq: Once | INTRAVENOUS | Status: DC
  Filled 2015-12-12: qty 15

## 2015-12-12 MED ORDER — DEXTROSE 5 % IV SOLN
500.0000 mg | Freq: Two times a day (BID) | INTRAVENOUS | Status: DC
  Administered 2015-12-12: 500 mg via INTRAVENOUS
  Filled 2015-12-12 (×3): qty 5

## 2015-12-12 MED ORDER — MIDAZOLAM BOLUS VIA INFUSION
23.0000 mg | Freq: Once | INTRAVENOUS | Status: AC
Start: 2015-12-12 — End: 2015-12-12
  Administered 2015-12-12: 23 mg via INTRAVENOUS
  Filled 2015-12-12: qty 23

## 2015-12-12 MED ORDER — SODIUM BICARBONATE 8.4 % IV SOLN
INTRAVENOUS | Status: AC
  Filled 2015-12-12: qty 50

## 2015-12-12 MED ORDER — SUCCINYLCHOLINE CHLORIDE 20 MG/ML IJ SOLN
INTRAMUSCULAR | Status: AC | PRN
  Administered 2015-12-12: 100 mg via INTRAVENOUS

## 2015-12-12 MED ORDER — DEXTROSE 5 % IV SOLN
0.0000 ug/min | INTRAVENOUS | Status: DC
  Administered 2015-12-12: 20 ug/min via INTRAVENOUS
  Administered 2015-12-12: 8 ug/min via INTRAVENOUS
  Filled 2015-12-12 (×2): qty 1

## 2015-12-12 MED ORDER — LORAZEPAM 2 MG/ML IJ SOLN: 2.0000 mg | Freq: Once | INTRAMUSCULAR | Status: AC

## 2015-12-12 MED ORDER — HEPARIN BOLUS VIA INFUSION
4000.0000 [IU] | Freq: Once | INTRAVENOUS | Status: DC
  Filled 2015-12-12: qty 4000

## 2015-12-12 MED ORDER — FAMOTIDINE 20 MG PO TABS: 20.0000 mg | ORAL_TABLET | Freq: Two times a day (BID) | ORAL | Status: DC

## 2015-12-12 NOTE — ED Notes (Signed)
Paged critical care @ [phone]

## 2015-12-12 NOTE — Progress Notes (Deleted)
ANTICOAGULATION CONSULT NOTE - Initial Consult  Pharmacy Consult for Heparin Indication: chest pain/ACS  Allergies  Allergen Reactions  . Ibuprofen Itching and Swelling    Patient Measurements: Height: 6\' 1"  (185.4 cm) IBW/kg (Calculated) : 79.9  Wt: 115 kg Heparin Dosing Weight: 105 kg  Vital Signs: Temp: 95.9 F (35.5 C) (03/26 0200) BP: 193/127 mmHg (03/26 0200) Pulse Rate: 104 (03/26 0200)  Labs:  Recent Labs  November 20, 2015 0102  HGB 13.0  HCT 40.1  PLT 289  CREATININE 1.78*    CrCl cannot be calculated (Unknown ideal weight.).   Medical History: Past Medical History  Diagnosis Date  . CKD (chronic kidney disease) stage 2, GFR 60-89 ml/min   . CAP (community acquired pneumonia) 01/08/2015  . Foreign body, eye 2009  . Hypertension     hx/pt 01/08/2015  . Chronic systolic (congestive) heart failure (HCC)     Medications:  Awaiting electronic med rec  Assessment: 50 y.o. M presented unresponsive - required intubation in ED, also went into PEA requiring CPR/epinephrine. To begin heparin for ACS. CBC ok on admission.   Goal of Therapy:  Heparin level 0.3-0.7 units/ml Monitor platelets by anticoagulation protocol: Yes   Plan:  Heparin IV bolus 4000 units Heparin gtt at 1500 units/hr Will f/u heparin level in 6 hours Daily heparin level and CBC   Christoper Fabianaron Docie Abramovich, PharmD, BCPS Clinical pharmacist, pager [phone] 10-23-2015,2:09 AM

## 2015-12-12 NOTE — Progress Notes (Signed)
PULMONARY / CRITICAL CARE MEDICINE   Name: Austin Mccarty MRN: [id number] DOB: February 23, 1966    ADMISSION DATE:  11/25/2015  CHIEF COMPLAINT:  PEA arrest  HISTORY OF PRESENT ILLNESS:   50 yo male with h/o HTN, HFrEF, CKD, DCM 2/2 HTN who presents from home with loss of consciousness.  Per report from friends and brother, he was in his usual state of health without any complaints.  He was with his friends today and became acutely encephalopathic, confused and dysarthric, he slurred his speech then fell back in his chair.  He arrived in the ED with sats in the 60's, emergently intubated then PEA arrest x68min then ROSC.  He was found to have a large right sided IPH with intraventricular hemorrhage likely 2/2 HTN.  SUBJECTIVE: Patient remains intubated and on low-level sedation as well as Cardene infusion. No other acute events since admission.  REVIEW OF SYSTEMS:  Unable to obtain given intubation and altered mental status with sedation.  VITAL SIGNS: Temp:  [94.8 F (34.9 C)-98.1 F (36.7 C)] 97.9 F (36.6 C) (03/26 0700) Pulse Rate:  [46-140] 106 (03/26 0700) Resp:  [16-92] 33 (03/26 0700) BP: (88-198)/(54-130) 129/73 mmHg (03/26 0700) SpO2:  [57 %-100 %] 95 % (03/26 0512) FiO2 (%):  [100 %] 100 % (03/26 0512) Weight:  [114.8 kg (253 lb 1.4 oz)] 114.8 kg (253 lb 1.4 oz) (03/26 0512) HEMODYNAMICS:   VENTILATOR SETTINGS: Vent Mode:  [-] PRVC FiO2 (%):  [100 %] 100 % Set Rate:  [30 bmp] 30 bmp Vt Set:  [640 mL] 640 mL PEEP:  [15 cmH20] 15 cmH20 Plateau Pressure:  [36 cmH20-41 cmH20] 36 cmH20 INTAKE / OUTPUT:  Intake/Output Summary (Last 24 hours) at 12/06/2015 0804 Last data filed at 12/02/2015 0700  Gross per 24 hour  Intake   2223 ml  Output   1425 ml  Net    798 ml    PHYSICAL EXAMINATION: General:  Sedated. No acute distress. Brother at bedside.  Integument:  Warm & dry. No rash on exposed skin.  HEENT:  No scleral injection or icterus. Endotracheal tube in place.   Cardiovascular:  Regular rate. Normal S1 & S2. No appreciable JVD.  Pulmonary:  Distant breath sounds bilaterally. Symmetric chest wall rise on ventilator. Abdomen: Soft. Normal bowel sounds. Nondistended.  Neurological:  Sedated. Pupils equal. No withdrawal to pain in extremities.  LABS:  CBC  Recent Labs Lab 12/11/2015 0102 12/01/2015 0438  WBC 17.0* 17.0*  HGB 13.0 13.7  HCT 40.1 43.0  PLT 289 PENDING   Coag's  Recent Labs Lab 12/02/2015 0438  APTT 32  INR 1.14   BMET  Recent Labs Lab 11/24/2015 0102 12/14/2015 0438  NA 132* 134*  K 3.6 3.9  CL 97* 98*  CO2 26 25  BUN 30* 31*  CREATININE 1.78* 1.78*  GLUCOSE 237* 262*   Electrolytes  Recent Labs Lab 12/14/2015 0102 11/25/2015 0438  CALCIUM 7.9* 8.2*  MG  --  1.9  PHOS  --  6.4*   Sepsis Markers  Recent Labs Lab 11/23/2015 0130 12/15/2015 0445  LATICACIDVEN 5.16* 2.23*   ABG  Recent Labs Lab 12/06/2015 0135 12/04/2015 0519  PHART 7.009* 7.228*  PCO2ART 93.7* 64.0*  PO2ART 67.0* 76.0*   Liver Enzymes  Recent Labs Lab 12/09/2015 0102  AST 41  ALT 47  ALKPHOS 48  BILITOT 0.5  ALBUMIN 2.5*   Cardiac Enzymes No results for input(s): TROPONINI, PROBNP in the last 168 hours. Glucose  Recent Labs Lab  December 25, 2015 0146 2015/12/25 0549  GLUCAP 239* 206*    Imaging Ct Head Wo Contrast  2015-12-25  ADDENDUM REPORT: 12/25/2015 02:39 ADDENDUM: These results were called by telephone at the time of interpretation on 12-25-15 at 2:39 am to Dr. Ross Marcus , who verbally acknowledged these results. Electronically Signed   By: Elgie Collard M.D.   On: 25-Dec-2015 02:39  12-25-2015  CLINICAL DATA:  50 year old male with altered mental status. Hypertension. EXAM: CT HEAD WITHOUT CONTRAST TECHNIQUE: Contiguous axial images were obtained from the base of the skull through the vertex without intravenous contrast. COMPARISON:  None. FINDINGS: There is a 2.6 x 3.7 cm right thalamic hemorrhage. Intraventricular  extension of blood noted in the lateral ventricles, third, and fourth ventricles. There is mild mass effect and mild compression of the third ventricle and approximately 2 mm right-to-left midline shift. There is diffuse effacement of the sulci likely related to mass effect caused by right thalamic and intraventricular hemorrhages. There is apparent mild diffuse hypoattenuation of the brain parenchyma which may represent degree of diffuse cerebral edema. The gray-white matter discrimination is however preserved. There is diffuse mucoperiosteal thickening and partial opacification of the paranasal sinuses. The mastoid air cells are clear. The calvarium is intact. IMPRESSION: Right thalamic hemorrhage as well as intraventricular hemorrhages with mild mass effect and a 2 mm right-to-left midline shift. Diffuse effacement of the sulci with possible mild diffuse cerebral edema. Electronically Signed: By: Elgie Collard M.D. On: 2015-12-25 02:29   Dg Chest Portable 1 View  December 25, 2015  CLINICAL DATA:  Post code with intubation EXAM: PORTABLE CHEST 1 VIEW COMPARISON:  06/25/2016 FINDINGS: Endotracheal tube placed with tip measuring 3.9 cm above the carina. Enteric tube tip is off the field of view but below the left hemidiaphragm. Shallow inspiration. Cardiac enlargement. Diffuse bilateral pulmonary infiltration consistent with airspace disease. This could represent edema, pneumonia, or aspiration. Old right rib fractures. IMPRESSION: Appliances appear to be in satisfactory location. Diffuse airspace disease throughout both lungs. Electronically Signed   By: Burman Nieves M.D.   On: 12-25-15 01:38   EVENTS: 3/26 - Admit  STUDIES: TTE (10/29/15):  EF 30-35% w/ diffuse hypokinesis. No AS or AR. Mild MR but no MS. RV moderately dilated w/ normal systolic function. LA severely dilated & RA mildly dilated. CT HEAD 2:19AM 3/26:  R Thalamic hemorrhage as well as IVH w/ mild mass effect & 2mm right to left midline  shift. Possible mild diffuse cerebral edema. Port CXR 3/26: No appreciated pleural effusion. Endotracheal tube in acceptable position. Diffuse bilateral alveolar opacities.  MICROBIOLOGY: Blood Ctx x2 3/26>>> Urine Ctx 3/26>>> Tracheal Asp Ctx 3/26>>> Influenza PCR 3/26>>> Respiratory Viral Panel PCR 3/26>>> Urine Strep Ag 3/26>>> Urine Legionella Ag 3/26>>> MRSA PCR 3/26:  Negative   ANTIBIOTICS: Vancomycin 3/26>>> Zosyn 3/26>>>  LINES/TUBES: OETT 7.5 3/26>>> OGT 3/26>>> FOLEY 3/26>>> PIV X4  ASSESSMENT / PLAN:  NEUROLOGIC A:   Right sided IPH - Sub-falcine herniation. Intraventricular Hemmorhage Catastrophic Neurologic Exam w/ Myoclonic Jerking  P:   Neurology Consulted RASS goal: 0 Propofol gtt Keppra IV VPA IV Mannitol IV EEG & possible continuous EEG if in status SBP goal 120-140 UDS Pending  PULMONARY A: Acute Hypoxic Respiratory Failure - Secondary to pulmonary edema. Pulmonary Edema - Cardiogenic vs Neurogenic Acute Hypercarbic Respiratory Failure  P:   Full Vent Support w/ low TV ventilation given high peak pressures Diuresis w/ Lasix SBT if patient survives  CARDIOVASCULAR A:  Possible Acute Systolic CHF Exacerbation - EF  30-35% 10/2015. Hypertensive Emergency  P:  Diuresis w/ lasix as BP tolerates Cardene gtt to maintain SBP 120-140 Monitor on telemetry Vitals per unit protocol  RENAL A:   Acute on Chronic Renal Failure - likely hypervolemic/cardiorenal syndrome. Lactic Acidosis - Resolving. Hyponatremia - Mild.  P:   Monitoring UOP with Foley Trending electrolytes & renal function daily Replace electrolytes as indicated  GASTROINTESTINAL A:   No acute issues.  P: NPO Protonix IV daily  HEMATOLOGIC A:   Leukocytosis - Sepsis vs Stress Induced.  P:  Trending cell counts daily w/ CBC SCDs No chemical DVT prophylaxis given ICH/IVH  INFECTIOUS A:   Possible CAP Possible UTI  P:   Empiric Vancomycin & Zosyn Day  #1 Procalcitonin per algorithm Awaiting culture results Checking Influenza PCR & Respiratory Panel PCR Checking Urine Legionella & Streptococcal Antigens  ENDOCRINE A:   Hyperglycemia - No h/o DM.  P:   Accu-Checks q4hr SSI per algorithm Hgb A1c pending  FAMILY UPDATES:  Brother updated at bedside by Dr. Jamison NeighborNestor & Fellow.  TODAY'S SUMMARY:  25M with IPH likely hypertensive bleed, PEA arrest from hypoxemia and acute exacerbation of CHF vs neurogenic vs hypertensive pulmonary edema.  Now with myoclonic jerks. Weaning off of nicardipine infusion. Neurology continuing treatment with mannitol & considering hypertonic saline. Placing arterial line for accurate blood pressure monitoring. If hypertonic saline is to be started patient will need central line placement.  I have spent an additional 22 minutes of critical care time today caring for the patient, discussing the plan of care with neurology, & reviewing the patient's electronic medical record.  Donna ChristenJennings E. Jamison NeighborNestor, M.D. [facility] Pager:  [phone] After 3pm or if no response, call [phone] 03/04/2016, 8:04 AM

## 2015-12-12 NOTE — Code Documentation (Addendum)
Intubation attempted, large amounts of frothy sputum noted, No color change, ET tube removed. OPA placed

## 2015-12-12 NOTE — Progress Notes (Signed)
Due to a language barrier, communication with the patient's wife was questionable. She has been relying on the brother for translation and decision-making. Wanted to make certain she was comfortable with this arrangement, so translation services was contacted. The translator was able to confirm that the patient's wife prefers for the brother to serve as the primary decision-maker.

## 2015-12-12 NOTE — Code Documentation (Signed)
Pulses present, rate 97, sinus rhythm

## 2015-12-12 NOTE — ED Notes (Signed)
CBG 239. RN notified.

## 2015-12-12 NOTE — Progress Notes (Signed)
STAT EEG completed; results pending. 

## 2015-12-12 NOTE — Progress Notes (Signed)
Notified Dr.Horton of ABG results. Patients respiratory rate increased to 30 bpm

## 2015-12-12 NOTE — Procedures (Signed)
  [facility]  EEG (ELECTROENCEPHALOGRAM) REPORT   STUDY DATE: 12/19/2015 PATIENT NAME: Austin Mccarty DOB: 06/24/1966 MRN: [id number]  ORDERING CLINICIAN: Noel Christmasharles Stewart  TECHNOLOGIST: Marianne SofiaKelly Suitor TECHNIQUE: Electroencephalogram was recorded utilizing standard 10-20 system of lead placement and reformatted into average and bipolar montages.  RECORDING TIME: 21 minutes ACTIVATION: none  CLINICAL INFORMATION: 50 year old male with acute loss of consciousness, PEA arrest, s/p resuscitation, found to have right thalamic intracerebral hemorrhage. Now with intermittent myoclonus.    FINDINGS:  Overall the background rhythms are notable for a burst suppression pattern. Background activity is attenuated for up to 10-30 seconds at a time, and then interrrupted with bursts of activity lasting 1-10 seconds at a time. During the bursts a variety of electrical activity is noted: intermittent rhythmic delta activity (2-3 hertz) and frequent right frontal-temporal (F4, FP2, F8, T4) poly-spike, spike and wave discharges. During the bursts there is associated myoclonic jerks on video recording. The myoclonic jerks occur randomly without a specific rhythm or evolving pattern.  Patient recorded in the comatose state. EKG channel shows tachycardia of 100-110 beats per minute.   IMPRESSION:  Abnormal EEG in the comatose state demonstrating: 1. Burst suppression pattern (1-10 seconds of burst activity with up to 10-30 seconds of suppression). 2. During the burst activity, there are frequent right frontal-temporal poly-spike and spike-and-wave discharges. These are associated with myoclonic jerks on video recording.    INTERPRETING PHYSICIAN:  Suanne MarkerVIKRAM R. PENUMALLI, MD Certified in Neurology, Neurophysiology and Neuroimaging  [facility] [address], [address] [place], [place] [address] [phone]

## 2015-12-12 NOTE — Code Documentation (Addendum)
Pulse check, no pulse; CPR resumed 

## 2015-12-12 NOTE — ED Notes (Signed)
Pt to ED by [facility] c/o sudden onset of shortness of breath. Family reports pt was sitting around talking with family members when pt clutched his chest and became unresponsive. On EMS arrival, pt with large amounts of frothy sputum, sats 57%. Dr.Horton at bedside for intubation

## 2015-12-12 NOTE — Progress Notes (Signed)
Sputum sample obtained and sent down to main lab without any complications.  

## 2015-12-12 NOTE — Code Documentation (Signed)
Cardiology at bedside.

## 2015-12-12 NOTE — Progress Notes (Signed)
LTM day 1 started; Dr Singh notified. 

## 2015-12-12 NOTE — Code Documentation (Signed)
Pt bradycardic at 27, No pulse, cpr started

## 2015-12-12 NOTE — ED Notes (Signed)
Sats remain low; ET tube removed, new tube placed, +end tidal.

## 2015-12-12 NOTE — Progress Notes (Signed)
Neurohospitalist service   EEG performed this morning with results, per Dr. Marjory LiesPenumalli, as follows:  The EEG exhibits a burst suppression pattern (1-10 seconds of burst activity with up to 10-30 seconds of suppression). During the burst activity, there are frequent right frontal-temporal poly-spike and spike-and-wave discharges. These are associated with myoclonic jerks on video recording.  Impression: 1. Myoclonic status epilepticus.  2. Intracerebral hemorrhage.   Recommendations: 1. Start Versed gtt with 0.2 mg/kg bolus, followed by an infusion rate of 0.2 mg/kg/hr. 2. Will need continuous EEG monitoring.  3. Continue propofol gtt at 40 mcg/kg/min. 4. Increase frequency of scheduled valproic acid to 500 mg IV TID. 5. Continue Keppra at 1000 mg IV BID.  6. Stroke Neurology service will provide periodic assessment and recommendations regarding management of ICH.  Caryl PinaEric Naketa Daddario, MD

## 2015-12-12 NOTE — Progress Notes (Signed)
   12/05/2015 0100  Clinical Encounter Type  Visited With Family  Visit Type ED  Referral From Nurse  Spiritual Encounters  Spiritual Needs Emotional  Stress Factors  Family Stress Factors Lack of knowledge;Health changes  Chaplain called to ED to meet family and friends of patient in Trauma C. Escorted them to consult room, brought coffee and water, informed doctor of their presence.

## 2015-12-12 NOTE — Progress Notes (Signed)
Pharmacy Antibiotic Note  Austin Mccarty is a 50 y.o. male admitted on 11/22/2015 and s/p PEA arrest and also noted with right sided IPH with intraventricular hemorrhage  .  Pharmacy has been consulted for zosyn and vancomycin dosing. Vancomycin 1000mg  given in the ED at ~ 2am -WBC= 27, tmax= 100.8, SCr= 1.78 and CrCl ~ 50 (normalized)  Plan: -Vancomycin 1750mg  IV x1 followed by 1250mg  IV q24hr -Zosyn 3.375gm IV q8h -Will follow renal function, cultures and clinical progress   Height: 6\' 1"  (185.4 cm) Weight: 253 lb 1.4 oz (114.8 kg) IBW/kg (Calculated) : 79.9  Temp (24hrs), Avg:97.9 F (36.6 C), Min:94.8 F (34.9 C), Max:100.8 F (38.2 C)   Recent Labs Lab 12/08/2015 0102 12/11/2015 0130 12/08/2015 0438 12/02/2015 0445  WBC 17.0*  --  17.0*  --   CREATININE 1.78*  --  1.78*  --   LATICACIDVEN  --  5.16*  --  2.23*    Estimated Creatinine Clearance: 68.1 mL/min (by C-G formula based on Cr of 1.78).    Allergies  Allergen Reactions  . Ibuprofen Itching and Swelling    Antimicrobials this admission: 3/26 vanc 3/26 zosyn  Microbiology results: 3/26 urine 3/26 resp 3/26 MRSA PCR- neg 3/26 bllod x2  Thank you for allowing pharmacy to be a part of this patient's care.  Harland GermanAndrew Cato Liburd, Pharm D 11/22/2015 2:19 PM

## 2015-12-12 NOTE — Code Documentation (Signed)
Pulse check, no pulse, CPR resumed

## 2015-12-12 NOTE — ED Provider Notes (Signed)
CSN: [id number]     Arrival date & time 12/13/2015  0052 History   By signing my name below, I, Arlan OrganAshley Leger, attest that this documentation has been prepared under the direction and in the presence of Shon Batonourtney F Horton, MD.  Electronically Signed: Arlan OrganAshley Leger, ED Scribe. 12/13/2015. 1:22 AM.   Chief Complaint  Patient presents with  . Respiratory Arrest   The history is provided by the EMS personnel. No language interpreter was used.     LEVEL 5 CAVEAT DUE TO RESPIRATORY DISTRESS   HPI Comments: Austin Mccarty brought in by EMS is a 50 y.o. male with a PMHx of CKD, CHF, and HTN who presents to the Emergency Department complaining here for respiratory distress this evening. Pt was somewhat responsive upon EMS arrival. However, he quickly became unresponsive prior to getting him on the truck. Pt began aspirating en route with several episodes of vomiting. Breathing was assisted by EMS prior to arrival. However, no other interventions given. Pt was recently treated for PNA. No reported drug use.  Per chart review, last ejection fraction 30%.  PCP: Jaclyn ShaggyEnobong, Amao, MD    Past Medical History  Diagnosis Date  . CKD (chronic kidney disease) stage 2, GFR 60-89 ml/min   . CAP (community acquired pneumonia) 01/08/2015  . Foreign body, eye 2009  . Hypertension     hx/pt 01/08/2015  . Chronic systolic (congestive) heart failure [facility](HCC)    Past Surgical History  Procedure Laterality Date  . Eye surgery Right 2009    "removed glass"   Family History  Problem Relation Age of Onset  . Hypertension Father    Social History  Substance Use Topics  . Smoking status: Former Smoker -- 1.00 packs/day for 8 years    Types: Cigarettes    Quit date: 10/25/2007  . Smokeless tobacco: Never Used     Comment: "quit smoking in ~ 2009  . Alcohol Use: No    Review of Systems  Unable to perform ROS: Severe respiratory distress      Allergies  Ibuprofen  Home Medications   Prior to  Admission medications   Medication Sig Start Date End Date Taking? Authorizing Provider  carvedilol (COREG) 25 MG tablet Take 1 tablet (25 mg total) by mouth 2 (two) times daily with a meal. 11/08/15   Beatrice LecherScott T Weaver, PA-C  fluticasone (FLONASE) 50 MCG/ACT nasal spray Place 2 sprays into both nostrils daily. 11/01/15   Maryann Mikhail, DO  furosemide (LASIX) 80 MG tablet Take 80 mg by mouth as directed. 80 mg AM/40 mg PM    Historical Provider, MD  hydrALAZINE (APRESOLINE) 50 MG tablet Take 1 tablet (50 mg total) by mouth 3 (three) times daily. 11/22/15   Beatrice LecherScott T Weaver, PA-C  isosorbide mononitrate (IMDUR) 60 MG 24 hr tablet Take 1 tablet (60 mg total) by mouth daily. 11/22/15   Beatrice LecherScott T Weaver, PA-C  losartan (COZAAR) 100 MG tablet Take 1 tablet (100 mg total) by mouth daily. 11/01/15   Maryann Mikhail, DO  potassium chloride SA (K-DUR,KLOR-CON) 10 MEQ tablet Take 1 tablet (10 mEq total) by mouth daily. 11/22/15   Beatrice LecherScott T Weaver, PA-C  spironolactone (ALDACTONE) 25 MG tablet Take 1 tablet (25 mg total) by mouth daily. 11/22/15   Beatrice LecherScott T Weaver, PA-C   Triage Vitals: BP 163/107 mmHg  Pulse 97  Temp(Src) 95.9 F (35.5 C)  Resp 30  Ht 6\' 1"  (1.854 m)  SpO2 93%   Physical Exam  HENT:  Head:  Normocephalic and atraumatic.  Eyes:  Pupils 4 mm and minimally reactive bilaterally  Cardiovascular: Regular rhythm.   Initially tachycardic  Pulmonary/Chest: He is in respiratory distress. He has wheezes. He has rales.  Tachypnea, sonorous respiration, copious frothy sputum noted at the nose and mouth  Abdominal: Soft.  Musculoskeletal: He exhibits edema.  Neurological:  GCS 3  Skin:  Diaphoretic  Nursing note and vitals reviewed.   ED Course  Procedures (including critical care time)  DIAGNOSTIC STUDIES: Oxygen Saturation is 70% on intubated, low by my interpretation.    COORDINATION OF CARE: 12:55 AM-Discussed treatment plan with pt at bedside and pt agreed to plan.     CPR started at 1:00 PM.  Epinephrine given at 12:54, 1:04, 1:08, and 1:15. Bicarb given at 1:03 and 1:16.    INTUBATION Performed by: Shon Baton  Required items: required blood products, implants, devices, and special equipment available Patient identity confirmed: provided demographic data and hospital-assigned identification number Time out: Immediately prior to procedure a "time out" was called to verify the correct patient, procedure, equipment, support staff and site/side marked as required.  Indications: respiratory failure  Intubation method: Glidescope Laryngoscopy   Preoxygenation: BVM  Sedatives: Etomidate Paralytic: Rocuronium  Tube Size: 7.5 cuffed  Post-procedure assessment: chest rise and ETCO2 monitor Breath sounds: equal and absent over the epigastrium Tube secured with: ETT holder Chest x-ray interpreted by radiologist and me.  Chest x-ray findings: endotracheal tube in appropriate position   Cardiopulmonary Resuscitation (CPR) Procedure Note Directed/Performed by: Shon Baton I personally directed ancillary staff and/or performed CPR in an effort to regain return of spontaneous circulation and to maintain cardiac, neuro and systemic perfusion.   CRITICAL CARE Performed by: Shon Baton, MD  Total critical care time: 90 minutes Critical care time was exclusive of separately billable procedures and treating other patients. Critical care was necessary to treat or prevent imminent or life-threatening deterioration. Critical care was time spent personally by me on the following activities: development of treatment plan with patient and/or surrogate as well as nursing, discussions with consultants, evaluation of patient's response to treatment, examination of patient, obtaining history from patient or surrogate, ordering and performing treatments and interventions, ordering and review of laboratory studies, ordering and review of radiographic studies, pulse oximetry and  re-evaluation of patient's condition.   1:45 AM- Spoke with Dr. Verne Carrow with [facility]. Pt will not go to cath lab this evening.   1:47 AM- Spoke with Dr. Hosie Poisson. He reccommended starting a central line.  Labs Review Labs Reviewed  CBC WITH DIFFERENTIAL/PLATELET - Abnormal; Notable for the following:    WBC 17.0 (*)    MCV 69.7 (*)    MCH 22.6 (*)    RDW 17.9 (*)    Neutro Abs 9.1 (*)    Lymphs Abs 6.8 (*)    All other components within normal limits  COMPREHENSIVE METABOLIC PANEL - Abnormal; Notable for the following:    Sodium 132 (*)    Chloride 97 (*)    Glucose, Bld 237 (*)    BUN 30 (*)    Creatinine, Ser 1.78 (*)    Calcium 7.9 (*)    Total Protein 5.2 (*)    Albumin 2.5 (*)    GFR calc non Af Amer 44 (*)    GFR calc Af Amer 51 (*)    All other components within normal limits  I-STAT CG4 LACTIC ACID, ED - Abnormal; Notable for the following:  Lactic Acid, Venous 5.16 (*)    All other components within normal limits  I-STAT ARTERIAL BLOOD GAS, ED - Abnormal; Notable for the following:    pH, Arterial 7.009 (*)    pCO2 arterial 93.7 (*)    pO2, Arterial 67.0 (*)    Acid-base deficit 10.0 (*)    All other components within normal limits  CBG MONITORING, ED - Abnormal; Notable for the following:    Glucose-Capillary 239 (*)    All other components within normal limits  CULTURE, BLOOD (ROUTINE X 2)  CULTURE, BLOOD (ROUTINE X 2)  URINALYSIS, ROUTINE W REFLEX MICROSCOPIC (NOT AT [facility])  Rosezena Sensor, ED    Imaging Review Ct Head Wo Contrast  11/25/2015  ADDENDUM REPORT: 12/02/2015 02:39 ADDENDUM: These results were called by telephone at the time of interpretation on 12/15/2015 at 2:39 am to Dr. Ross Marcus , who verbally acknowledged these results. Electronically Signed   By: Elgie Collard M.D.   On: 11/27/2015 02:39  11/25/2015  CLINICAL DATA:  50 year old male with altered mental status. Hypertension. EXAM: CT HEAD WITHOUT CONTRAST  TECHNIQUE: Contiguous axial images were obtained from the base of the skull through the vertex without intravenous contrast. COMPARISON:  None. FINDINGS: There is a 2.6 x 3.7 cm right thalamic hemorrhage. Intraventricular extension of blood noted in the lateral ventricles, third, and fourth ventricles. There is mild mass effect and mild compression of the third ventricle and approximately 2 mm right-to-left midline shift. There is diffuse effacement of the sulci likely related to mass effect caused by right thalamic and intraventricular hemorrhages. There is apparent mild diffuse hypoattenuation of the brain parenchyma which may represent degree of diffuse cerebral edema. The gray-white matter discrimination is however preserved. There is diffuse mucoperiosteal thickening and partial opacification of the paranasal sinuses. The mastoid air cells are clear. The calvarium is intact. IMPRESSION: Right thalamic hemorrhage as well as intraventricular hemorrhages with mild mass effect and a 2 mm right-to-left midline shift. Diffuse effacement of the sulci with possible mild diffuse cerebral edema. Electronically Signed: By: Elgie Collard M.D. On: 11/21/2015 02:29   Dg Chest Portable 1 View  12/04/2015  CLINICAL DATA:  Post code with intubation EXAM: PORTABLE CHEST 1 VIEW COMPARISON:  06/25/2016 FINDINGS: Endotracheal tube placed with tip measuring 3.9 cm above the carina. Enteric tube tip is off the field of view but below the left hemidiaphragm. Shallow inspiration. Cardiac enlargement. Diffuse bilateral pulmonary infiltration consistent with airspace disease. This could represent edema, pneumonia, or aspiration. Old right rib fractures. IMPRESSION: Appliances appear to be in satisfactory location. Diffuse airspace disease throughout both lungs. Electronically Signed   By: Burman Nieves M.D.   On: 11/17/2015 01:38   I have personally reviewed and evaluated these images and lab results as part of my medical  decision-making.   EKG Interpretation   Date/Time:  Sunday December 12 2015 01:34:20 EDT Ventricular Rate:  90 PR Interval:  176 QRS Duration: 105 QT Interval:  408 QTC Calculation: 499 R Axis:   18 Text Interpretation:  Age not entered, assumed to be  50 years old for  purpose of ECG interpretation Sinus rhythm Probable left atrial  enlargement Repol abnrm, severe global ischemia (LM/MVD) Baseline wander  in lead(s) V3 V4 Confirmed by HORTON  MD, COURTNEY ([id number]) on 12/11/2015  2:58:56 AM      MDM   Final diagnoses:  Acute on chronic respiratory failure with hypoxia and hypercapnia (HCC)  Cardiac arrest (HCC)  Intracranial hemorrhage (HCC)  Patient initially satting 60% upon arrival. Proceeded with RSI intubation. Patient was intubated initially with an 8 ET tube. He had good end-tidal CO2 and good bilateral breath sounds; however, it was noted that the in tidal got weaker and weaker with color change. Tube placement was verified visually with glide scope and continued bilateral breath sounds. However, it was noted that there was a cuff leak.  Patient went into PEA arrest. Presumed hypoxic. He rested for approximately 15 minutes requiring 4 doses of epinephrine and 2 doses of bicarbonate. Again ET tube was confirmed but color change was weak. Decision was made to replace ET tube with a 7.5 ET tube.  Confirmed with breath sounds and end-tidal. Patient was difficult to bag and had hypoxia in the mid 60s. However, he did regain pulses.   Initially, unclear the etiology of the patient's hypoxia. It appears to be pulmonary edema. Lab work including blood cultures and broad-spectrum antibiotics were started. Fluids were held given concern for pulmonary edema. He did have one hypotensive episode but this followed with hypertension. Suspect patient went into hypertensive emergency with diffuse pulmonary edema. Nitroglycerin was initially ordered; however, after patient had a head CT which showed a  thalamic bleed, this was changed to nicardipine. Patient also had diffuse ST depressions laterally on his EKG. Discussed with on-call STEMI doc. Not a candidate that this time. Given his thalamic bleed, feel patient's presentation is likely a result of hypertensive emergency. He is currently on a nicardipine drip. Critical care and neurology consulting.   I personally performed the services described in this documentation, which was scribed in my presence. The recorded information has been reviewed and is accurate.   Shon Baton, MD 12/25/2015 [phone]

## 2015-12-12 NOTE — Code Documentation (Signed)
Pt taken to CT with Sharyn LullBarbara J RN

## 2015-12-12 NOTE — Code Documentation (Addendum)
CCM at bedside; heparin d/c due to intracranial bleed; nitro held at this time; cardene started for hypertension

## 2015-12-12 NOTE — Procedures (Signed)
Arterial Catheter Insertion Procedure Note Austin Mccarty [id number] 09/13/1966  Procedure: Insertion of Arterial Catheter  Indications: Blood pressure monitoring and Frequent blood sampling  Procedure Details Consent: Risks of procedure as well as the alternatives and risks of each were explained to the (patient/caregiver).  Consent for procedure obtained. Time Out: Verified patient identification, verified procedure, site/side was marked, verified correct patient position, special equipment/implants available, medications/allergies/relevent history reviewed, required imaging and test results available.  Performed  Maximum sterile technique was used including antiseptics, cap, gloves, gown, hand hygiene, mask and sheet. Skin prep: Chlorhexidine; local anesthetic administered 20 gauge catheter was inserted into left radial artery using the Seldinger technique.  Evaluation Blood flow good; BP tracing good. Complications: No apparent complications.   Austin Mccarty, Austin Mccarty 2016-05-11

## 2015-12-12 NOTE — H&P (Signed)
PULMONARY / CRITICAL CARE MEDICINE   Name: Austin Mccarty MRN: [id number] DOB: 07-02-1966    ADMISSION DATE:  12/01/2015  CHIEF COMPLAINT:  PEA arrest  HISTORY OF PRESENT ILLNESS:   50 yo male with h/o HTN, HFrEF, CKD, DCM 2/2 HTN who presents from home with loss of consciousness.  Per report from friends and brother, he was in his usual state of health without any complaints.  He was with his friends today and became acutely encephalopathic, confused and dysarthric, he slurred his speech then fell back in his chair.  He arrived in the ED with sats in the 60's, emergently intubated then PEA arrest x45min then ROSC.  He was found to have a large right sided IPH with intraventricular hemorrhage likely 2/2 HTN.  PAST MEDICAL HISTORY :   has a past medical history of CKD (chronic kidney disease) stage 2, GFR 60-89 ml/min; CAP (community acquired pneumonia) (01/08/2015); Foreign body, eye (2009); Hypertension; and Chronic systolic (congestive) heart failure (HCC).  has past surgical history that includes Eye surgery (Right, 2009). Prior to Admission medications   Medication Sig Start Date End Date Taking? Authorizing Provider  carvedilol (COREG) 25 MG tablet Take 1 tablet (25 mg total) by mouth 2 (two) times daily with a meal. 11/08/15   Beatrice Lecher, PA-C  fluticasone (FLONASE) 50 MCG/ACT nasal spray Place 2 sprays into both nostrils daily. 11/01/15   Maryann Mikhail, DO  furosemide (LASIX) 80 MG tablet Take 80 mg by mouth as directed. 80 mg AM/40 mg PM    Historical Provider, MD  hydrALAZINE (APRESOLINE) 50 MG tablet Take 1 tablet (50 mg total) by mouth 3 (three) times daily. 11/22/15   Beatrice Lecher, PA-C  isosorbide mononitrate (IMDUR) 60 MG 24 hr tablet Take 1 tablet (60 mg total) by mouth daily. 11/22/15   Beatrice Lecher, PA-C  losartan (COZAAR) 100 MG tablet Take 1 tablet (100 mg total) by mouth daily. 11/01/15   Maryann Mikhail, DO  potassium chloride SA (K-DUR,KLOR-CON) 10 MEQ tablet Take 1  tablet (10 mEq total) by mouth daily. 11/22/15   Beatrice Lecher, PA-C  spironolactone (ALDACTONE) 25 MG tablet Take 1 tablet (25 mg total) by mouth daily. 11/22/15   Beatrice Lecher, PA-C   Allergies  Allergen Reactions  . Ibuprofen Itching and Swelling    FAMILY HISTORY:  indicated that his mother is alive. He indicated that his father is deceased.  SOCIAL HISTORY:  reports that he quit smoking about 8 years ago. His smoking use included Cigarettes. He has a 8 pack-year smoking history. He has never used smokeless tobacco. He reports that he does not drink alcohol or use illicit drugs.  REVIEW OF SYSTEMS:   Unable to obtain  SUBJECTIVE:   VITAL SIGNS: Temp:  [94.8 F (34.9 C)-95.9 F (35.5 C)] 95 F (35 C) (03/26 0400) Pulse Rate:  [46-140] 86 (03/26 0400) Resp:  [16-92] 24 (03/26 0400) BP: (88-198)/(54-130) 150/93 mmHg (03/26 0400) SpO2:  [57 %-100 %] 94 % (03/26 0400) FiO2 (%):  [100 %] 100 % (03/26 0134) HEMODYNAMICS:   VENTILATOR SETTINGS: Vent Mode:  [-] PRVC FiO2 (%):  [100 %] 100 % Set Rate:  [30 bmp] 30 bmp Vt Set:  [640 mL] 640 mL PEEP:  [15 cmH20] 15 cmH20 Plateau Pressure:  [41 cmH20] 41 cmH20 INTAKE / OUTPUT:  Intake/Output Summary (Last 24 hours) at 11/27/2015 0406 Last data filed at 12/01/2015 0207  Gross per 24 hour  Intake   2000  ml  Output    100 ml  Net   1900 ml    PHYSICAL EXAMINATION: General:  Intubated and sedated Neuro:  +Myoclonic jerking, no gag, no withdrawal to pain. Pupils 2mm non reactive HEENT:  ETT in place Cardiovascular:  RRR, s1/s2 no m/r/g Lungs:  CTA b/l no w/r/r Abdomen:  Soft, nondistended, no bowel sounds Musculoskeletal:  Normal bulk and tone Skin:  No c/c/e  LABS:  CBC  Recent Labs Lab 12/06/2015 0102  WBC 17.0*  HGB 13.0  HCT 40.1  PLT 289   Coag's No results for input(s): APTT, INR in the last 168 hours. BMET  Recent Labs Lab 12/15/2015 0102  NA 132*  K 3.6  CL 97*  CO2 26  BUN 30*  CREATININE 1.78*   GLUCOSE 237*   Electrolytes  Recent Labs Lab 12/05/2015 0102  CALCIUM 7.9*   Sepsis Markers  Recent Labs Lab 12/17/2015 0130  LATICACIDVEN 5.16*   ABG  Recent Labs Lab 11/18/2015 0135  PHART 7.009*  PCO2ART 93.7*  PO2ART 67.0*   Liver Enzymes  Recent Labs Lab 12/02/2015 0102  AST 41  ALT 47  ALKPHOS 48  BILITOT 0.5  ALBUMIN 2.5*   Cardiac Enzymes No results for input(s): TROPONINI, PROBNP in the last 168 hours. Glucose  Recent Labs Lab 12/05/2015 0146  GLUCAP 239*    Imaging Ct Head Wo Contrast  12/13/2015  ADDENDUM REPORT: 12/17/2015 02:39 ADDENDUM: These results were called by telephone at the time of interpretation on 12/13/2015 at 2:39 am to Dr. Ross MarcusOURTNEY HORTON , who verbally acknowledged these results. Electronically Signed   By: Elgie CollardArash  Radparvar M.D.   On: 11/29/2015 02:39  12/09/2015  CLINICAL DATA:  50 year old male with altered mental status. Hypertension. EXAM: CT HEAD WITHOUT CONTRAST TECHNIQUE: Contiguous axial images were obtained from the base of the skull through the vertex without intravenous contrast. COMPARISON:  None. FINDINGS: There is a 2.6 x 3.7 cm right thalamic hemorrhage. Intraventricular extension of blood noted in the lateral ventricles, third, and fourth ventricles. There is mild mass effect and mild compression of the third ventricle and approximately 2 mm right-to-left midline shift. There is diffuse effacement of the sulci likely related to mass effect caused by right thalamic and intraventricular hemorrhages. There is apparent mild diffuse hypoattenuation of the brain parenchyma which may represent degree of diffuse cerebral edema. The gray-white matter discrimination is however preserved. There is diffuse mucoperiosteal thickening and partial opacification of the paranasal sinuses. The mastoid air cells are clear. The calvarium is intact. IMPRESSION: Right thalamic hemorrhage as well as intraventricular hemorrhages with mild mass effect and a 2  mm right-to-left midline shift. Diffuse effacement of the sulci with possible mild diffuse cerebral edema. Electronically Signed: By: Elgie CollardArash  Radparvar M.D. On: 11/20/2015 02:29   Dg Chest Portable 1 View  11/29/2015  CLINICAL DATA:  Post code with intubation EXAM: PORTABLE CHEST 1 VIEW COMPARISON:  06/25/2016 FINDINGS: Endotracheal tube placed with tip measuring 3.9 cm above the carina. Enteric tube tip is off the field of view but below the left hemidiaphragm. Shallow inspiration. Cardiac enlargement. Diffuse bilateral pulmonary infiltration consistent with airspace disease. This could represent edema, pneumonia, or aspiration. Old right rib fractures. IMPRESSION: Appliances appear to be in satisfactory location. Diffuse airspace disease throughout both lungs. Electronically Signed   By: Burman NievesWilliam  Stevens M.D.   On: 12/11/2015 01:38     ASSESSMENT / PLAN: 35M with IPH likely hypertensive bleed, PEA arrest from hypoxemia and acute exacerbation of CHF  vs neurogenic vs hypertensive pulmonary edema.  Now with myoclonic jerks.  PULMONARY A: Pulmonary edema - cardiogenic vs neurogenic Intubated  Acute hypoxemic respiratory failure P:   - intubated with High peak pressures and low lung compliance - Aggressive diuresis with lasix and mannitol - Oral care - PPI - Low TV ventilation with 6cc/kg and plat press <30  CARDIOVASCULAR A:  HFrEF Possible CHF exacerbation Hypertensive emergency Shock   P:  - Diuresis as above - Nicardipine gtt - SBP 120-140 - Troponin negative - lactate downtrending  - trend lactate  RENAL A:   AKI on CKD - likely hypervolemic/cardiorenal syndrome HypoNatremia Uremia P:   - diuresis - no indication for emergent HD at this time - diuresis as above  GASTROINTESTINAL A:  Not active  HEMATOLOGIC A:   Leukocytosis - likely stress vs UTI P:  - trend WBC - treat UA +  INFECTIOUS A:   UTI - UA dirty but with squamous cells P:   BCx2 January 03, 2016 [facility]  01-03-16 Sputum N/A Abx: vanc and zosyn start date17-Apr-2017 - likely d/c tomorrow   ENDOCRINE A:   Hyperglycemia   P:   - SSI  NEUROLOGIC A:   Right sided IPH - sub-falcine herniation Intraventricular hemmorhage Catastrophic neurologic exam Myoclonic jerking P:   RASS goal: 0 - holding sedation now - Keppra load  x1 - Keppra 1g BID IV - Neurosurg - not surgical candidate - neurology - consulted - BP control SBP 120-140  FC/FT SCD NPO  FAMILY  - Updates: brother updated at bedside  Total critical care time: 45 min  Critical care time was exclusive of separately billable procedures and treating other patients.  Critical care was necessary to treat or prevent imminent or life-threatening deterioration.  Critical care was time spent personally by me on the following activities: development of treatment plan with patient and/or surrogate as well as nursing, discussions with consultants, evaluation of patient's response to treatment, examination of patient, obtaining history from patient or surrogate, ordering and performing treatments and interventions, ordering and review of laboratory studies, ordering and review of radiographic studies, pulse oximetry and re-evaluation of patient's condition.   Galvin Proffer, DO., MS [facility] [facility]       Pulmonary and Critical Care Medicine [facility] Pager: [phone]  Jan 03, 2016, 4:06 AM

## 2015-12-12 NOTE — ED Notes (Signed)
Family at bedside. Belongings given to brother

## 2015-12-12 NOTE — Progress Notes (Signed)
Utilization review completed.  

## 2015-12-12 NOTE — Consult Note (Addendum)
Admission H&P    Chief Complaint: Acute intracerebral hemorrhage and cardiac arrest.  HPI: Austin Mccarty is an 50 y.o. male with a history of hypertension, CHF and stage II chronic kidney disease brought to the emergency room following acute onset of loss of consciousness. Patient patient reportedly became confused and dysarthric prior to losing consciousness. His O2 saturation in the ED was in the 60s and he was intubated and placed on mechanical ventilation. Shortly after intubation he went into PEA arrest and underwent resuscitation for 15 minutes. Subsequent CT of his head showed a large right intra-cerebral hemorrhage with a large and in the thalamus and extension into lateral ventricles. There was a 3 mm right to left midline shift. Patient was hypothermic with WBC count of 17,000, hyperglycemic in the 300s and had abnormal urinalysis indicative of probable urinary tract infection.  LSN: Not documented tPA Given: No: Acute thalamic hemorrhage mRankin:  Past Medical History  Diagnosis Date  . CKD (chronic kidney disease) stage 2, GFR 60-89 ml/min   . CAP (community acquired pneumonia) 01/08/2015  . Foreign body, eye 2009  . Hypertension     hx/pt 01/08/2015  . Chronic systolic (congestive) heart failure [facility])     Past Surgical History  Procedure Laterality Date  . Eye surgery Right 2009    "removed glass"    Family History  Problem Relation Age of Onset  . Hypertension Father    Social History:  reports that he quit smoking about 8 years ago. His smoking use included Cigarettes. He has a 8 pack-year smoking history. He has never used smokeless tobacco. He reports that he does not drink alcohol or use illicit drugs.  Allergies:  Allergies  Allergen Reactions  . Ibuprofen Itching and Swelling    Medications Prior to Admission  Medication Sig Dispense Refill  . carvedilol (COREG) 25 MG tablet Take 1 tablet (25 mg total) by mouth 2 (two) times daily with a meal. 180 tablet  3  . fluticasone (FLONASE) 50 MCG/ACT nasal spray Place 2 sprays into both nostrils daily. 16 g 2  . furosemide (LASIX) 80 MG tablet Take 80 mg by mouth as directed. 80 mg AM/40 mg PM    . hydrALAZINE (APRESOLINE) 50 MG tablet Take 1 tablet (50 mg total) by mouth 3 (three) times daily. 270 tablet 3  . isosorbide mononitrate (IMDUR) 60 MG 24 hr tablet Take 1 tablet (60 mg total) by mouth daily. 90 tablet 3  . losartan (COZAAR) 100 MG tablet Take 1 tablet (100 mg total) by mouth daily. 30 tablet 0  . potassium chloride SA (K-DUR,KLOR-CON) 10 MEQ tablet Take 1 tablet (10 mEq total) by mouth daily. 90 tablet 3  . spironolactone (ALDACTONE) 25 MG tablet Take 1 tablet (25 mg total) by mouth daily. 90 tablet 3    ROS: Unavailable as patient is unconscious.  Physical Examination: Blood pressure 166/98, pulse 111, temperature 96.6 F (35.9 C), resp. rate 44, height '6\' 1"'$  (1.854 m), weight 114.8 kg (253 lb 1.4 oz), SpO2 95 %.  HEENT-  Normocephalic, no lesions, without obvious abnormality.  Normal external eye and conjunctiva.  Normal TM's bilaterally.  Normal auditory canals and external ears. Normal external nose, mucus membranes and septum.  Normal pharynx. Neck supple with no masses, nodes, nodules or enlargement. Cardiovascular - regular rate and rhythm, S1, S2 normal, no murmur, click, rub or gallop Lungs - chest clear, no wheezing, rales, normal symmetric air entry Abdomen - soft, non-tender; bowel sounds normal; no  masses,  no organomegaly Extremities - no joint deformities, effusion, or inflammation and no edema  Neurologic Examination: Patient was intubated and on mechanical ventilation. He was unresponsive to external stimuli, including noxious stimuli. He was on no sedating medication at the time of this evaluation. It was unclear if he had spontaneous respirations. He had frequent generalized myoclonic jerks occurring every 20-30 seconds at lasting about 2-3 seconds. Pupils were equal  and did not react to light. Extraocular movements are absent with oculocephalic maneuvers. Face was symmetrical. Muscle tone was flaccid throughout rest. Deep tendon reflexes were absent throughout. Plantar responses were flexor bilaterally.  Results for orders placed or performed during the hospital encounter of 11/29/2015 (from the past 48 hour(s))  CBC with Differential     Status: Abnormal   Collection Time: 12/11/2015  1:02 AM  Result Value Ref Range   WBC 17.0 (H) 4.0 - 10.5 K/uL   RBC 5.75 4.22 - 5.81 MIL/uL   Hemoglobin 13.0 13.0 - 17.0 g/dL   HCT 40.1 39.0 - 52.0 %   MCV 69.7 (L) 78.0 - 100.0 fL   MCH 22.6 (L) 26.0 - 34.0 pg   MCHC 32.4 30.0 - 36.0 g/dL   RDW 17.9 (H) 11.5 - 15.5 %   Platelets 289 150 - 400 K/uL   Neutrophils Relative % 54 %   Lymphocytes Relative 40 %   Monocytes Relative 5 %   Eosinophils Relative 1 %   Basophils Relative 0 %   Neutro Abs 9.1 (H) 1.7 - 7.7 K/uL   Lymphs Abs 6.8 (H) 0.7 - 4.0 K/uL   Monocytes Absolute 0.9 0.1 - 1.0 K/uL   Eosinophils Absolute 0.2 0.0 - 0.7 K/uL   Basophils Absolute 0.0 0.0 - 0.1 K/uL   RBC Morphology ELLIPTOCYTES     Comment: BURR CELLS   WBC Morphology ATYPICAL LYMPHOCYTES   Comprehensive metabolic panel     Status: Abnormal   Collection Time: 12/11/2015  1:02 AM  Result Value Ref Range   Sodium 132 (L) 135 - 145 mmol/L   Potassium 3.6 3.5 - 5.1 mmol/L   Chloride 97 (L) 101 - 111 mmol/L   CO2 26 22 - 32 mmol/L   Glucose, Bld 237 (H) 65 - 99 mg/dL   BUN 30 (H) 6 - 20 mg/dL   Creatinine, Ser 1.78 (H) 0.61 - 1.24 mg/dL   Calcium 7.9 (L) 8.9 - 10.3 mg/dL   Total Protein 5.2 (L) 6.5 - 8.1 g/dL   Albumin 2.5 (L) 3.5 - 5.0 g/dL   AST 41 15 - 41 U/L   ALT 47 17 - 63 U/L   Alkaline Phosphatase 48 38 - 126 U/L   Total Bilirubin 0.5 0.3 - 1.2 mg/dL   GFR calc non Af Amer 44 (L) >60 mL/min   GFR calc Af Amer 51 (L) >60 mL/min    Comment: (NOTE) The eGFR has been calculated using the CKD EPI equation. This calculation  has not been validated in all clinical situations. eGFR's persistently <60 mL/min signify possible Chronic Kidney Disease.    Anion gap 9 5 - 15  I-Stat Troponin, ED (not at [facility])     Status: None   Collection Time: 11/21/2015  1:28 AM  Result Value Ref Range   Troponin i, poc 0.01 0.00 - 0.08 ng/mL   Comment 3            Comment: Due to the release kinetics of cTnI, a negative result within the first hours of  the onset of symptoms does not rule out myocardial infarction with certainty. If myocardial infarction is still suspected, repeat the test at appropriate intervals.   I-Stat CG4 Lactic Acid, ED     Status: Abnormal   Collection Time: 12/13/2015  1:30 AM  Result Value Ref Range   Lactic Acid, Venous 5.16 (HH) 0.5 - 2.0 mmol/L   Comment NOTIFIED PHYSICIAN   I-Stat Arterial Blood Gas, ED - (order at [facility] and [facility] only)     Status: Abnormal   Collection Time: 12/03/2015  1:35 AM  Result Value Ref Range   pH, Arterial 7.009 (LL) 7.350 - 7.450   pCO2 arterial 93.7 (HH) 35.0 - 45.0 mmHg   pO2, Arterial 67.0 (L) 80.0 - 100.0 mmHg   Bicarbonate 23.6 20.0 - 24.0 mEq/L   TCO2 26 0 - 100 mmol/L   O2 Saturation 79.0 %   Acid-base deficit 10.0 (H) 0.0 - 2.0 mmol/L   Patient temperature 98.6 F    Collection site RADIAL, ALLEN'S TEST ACCEPTABLE    Drawn by RT    Sample type ARTERIAL    Comment NOTIFIED PHYSICIAN   CBG monitoring, ED     Status: Abnormal   Collection Time: 12/11/2015  1:46 AM  Result Value Ref Range   Glucose-Capillary 239 (H) 65 - 99 mg/dL  Urinalysis, Routine w reflex microscopic (not at [facility])     Status: Abnormal   Collection Time: 11/24/2015  1:50 AM  Result Value Ref Range   Color, Urine YELLOW YELLOW   APPearance TURBID (A) CLEAR   Specific Gravity, Urine 1.019 1.005 - 1.030   pH 7.0 5.0 - 8.0   Glucose, UA 250 (A) NEGATIVE mg/dL   Hgb urine dipstick SMALL (A) NEGATIVE   Bilirubin Urine NEGATIVE NEGATIVE   Ketones, ur NEGATIVE NEGATIVE mg/dL   Protein, ur >300 (A)  NEGATIVE mg/dL   Nitrite NEGATIVE NEGATIVE   Leukocytes, UA NEGATIVE NEGATIVE  Urine microscopic-add on     Status: Abnormal   Collection Time: 12/17/2015  1:50 AM  Result Value Ref Range   Squamous Epithelial / LPF 6-30 (A) NONE SEEN   WBC, UA 6-30 0 - 5 WBC/hpf   RBC / HPF 6-30 0 - 5 RBC/hpf   Bacteria, UA MANY (A) NONE SEEN   Sperm, UA PRESENT    Urine-Other YEAST PRESENT   I-Stat CG4 Lactic Acid, ED     Status: Abnormal   Collection Time: 12/16/2015  4:45 AM  Result Value Ref Range   Lactic Acid, Venous 2.23 (HH) 0.5 - 2.0 mmol/L   Comment NOTIFIED PHYSICIAN   I-Stat arterial blood gas, ED     Status: Abnormal   Collection Time: 12/07/2015  5:19 AM  Result Value Ref Range   pH, Arterial 7.228 (L) 7.350 - 7.450   pCO2 arterial 64.0 (HH) 35.0 - 45.0 mmHg   pO2, Arterial 76.0 (L) 80.0 - 100.0 mmHg   Bicarbonate 27.0 (H) 20.0 - 24.0 mEq/L   TCO2 29 0 - 100 mmol/L   O2 Saturation 93.0 %   Acid-base deficit 3.0 (H) 0.0 - 2.0 mmol/L   Patient temperature 35.9 C    Collection site RADIAL, ALLEN'S TEST ACCEPTABLE    Drawn by Operator    Sample type ARTERIAL    Comment NOTIFIED PHYSICIAN    Ct Head Wo Contrast  12/01/2015  ADDENDUM REPORT: 11/22/2015 02:39 ADDENDUM: These results were called by telephone at the time of interpretation on 11/26/2015 at 2:39 am to Dr. Thayer Jew ,  who verbally acknowledged these results. Electronically Signed   By: Anner Crete M.D.   On: 11/22/2015 02:39  11/26/2015  CLINICAL DATA:  50 year old male with altered mental status. Hypertension. EXAM: CT HEAD WITHOUT CONTRAST TECHNIQUE: Contiguous axial images were obtained from the base of the skull through the vertex without intravenous contrast. COMPARISON:  None. FINDINGS: There is a 2.6 x 3.7 cm right thalamic hemorrhage. Intraventricular extension of blood noted in the lateral ventricles, third, and fourth ventricles. There is mild mass effect and mild compression of the third ventricle and  approximately 2 mm right-to-left midline shift. There is diffuse effacement of the sulci likely related to mass effect caused by right thalamic and intraventricular hemorrhages. There is apparent mild diffuse hypoattenuation of the brain parenchyma which may represent degree of diffuse cerebral edema. The gray-white matter discrimination is however preserved. There is diffuse mucoperiosteal thickening and partial opacification of the paranasal sinuses. The mastoid air cells are clear. The calvarium is intact. IMPRESSION: Right thalamic hemorrhage as well as intraventricular hemorrhages with mild mass effect and a 2 mm right-to-left midline shift. Diffuse effacement of the sulci with possible mild diffuse cerebral edema. Electronically Signed: By: Anner Crete M.D. On: 12/02/2015 02:29   Dg Chest Portable 1 View  11/25/2015  CLINICAL DATA:  Post code with intubation EXAM: PORTABLE CHEST 1 VIEW COMPARISON:  06/25/2016 FINDINGS: Endotracheal tube placed with tip measuring 3.9 cm above the carina. Enteric tube tip is off the field of view but below the left hemidiaphragm. Shallow inspiration. Cardiac enlargement. Diffuse bilateral pulmonary infiltration consistent with airspace disease. This could represent edema, pneumonia, or aspiration. Old right rib fractures. IMPRESSION: Appliances appear to be in satisfactory location. Diffuse airspace disease throughout both lungs. Electronically Signed   By: Lucienne Capers M.D.   On: 11/22/2015 01:38    Assessment: 50 y.o. male the history of hypertension presenting with acute right thalamic hemorrhage with extension into lateral ventricles, respiratory failure, cardiac arrest following intubation, probable sepsis, and generalized myoclonus with probable status epilepticus.  Stroke Risk Factors - hypertension  Plan: 1. HgbA1c, fasting lipid panel 2. MRI, MRA  of the brain without contrast 3. PT consult, OT consult, Speech consult 4. Echocardiogram 5. Carotid  dopplers 6. Prophylactic therapy-None 7. Propofol 20 mg bolus followed by 30 mcg/kg/m IV drip 8. Keppra 1000 mg IV loading dose followed by 1500 mg every 12 hours, as ordered 9. Add valproic acid 1000 mg IV followed by 500 mg every 12 hours 10. Stat EEG, and continuous EEG and video monitoring if patient is in status epilepticus  This patient is critically ill and at significant risk of neurological worsening or death, and care requires constant monitoring of vital signs, hemodynamics,respiratory and cardiac monitoring, neurological assessment, discussion with family, other specialists and medical decision making of high complexity. Total critical care time was 55 minutes.  C.R. Nicole Kindred, MD  [facility] [phone]  11/24/2015, 5:55 AM

## 2015-12-12 NOTE — Progress Notes (Addendum)
STROKE TEAM PROGRESS NOTE   HISTORY OF PRESENT ILLNESS at the time of admission Austin Mccarty is a 50 y.o. male with a history of hypertension, CHF and stage II chronic kidney disease brought to the emergency room following acute onset of loss of consciousness. Patient patient reportedly became confused and dysarthric prior to losing consciousness. His O2 saturation in the ED was in the 60s and he was intubated and placed on mechanical ventilation. Shortly after intubation he went into PEA arrest and underwent resuscitation for 15 minutes. Subsequent CT of his head showed a large right intra-cerebral hemorrhage with a large and in the thalamus and extension into lateral ventricles. There was a 3 mm right to left midline shift. Patient was hypothermic with WBC count of 17,000, hyperglycemic in the 300s and had abnormal urinalysis indicative of probable urinary tract infection.  LSN: Not documented tPA Given: No: Acute thalamic hemorrhage mRankin:   SUBJECTIVE (INTERVAL HISTORY) His brother was at the bedside.  I offered the brother interpreter services and he opted to have the conversation in [place].  Overall he feels seem to understand the patient's condition is extremely serious.  I provided information regarding the seriousness of ICH, the significance of cardiac arrest and our concerns that the next 2-3 days are critical.  He expressed understanding  OBJECTIVE Temp:  [94.8 F (34.9 C)-98.1 F (36.7 C)] 97.9 F (36.6 C) (03/26 0700) Pulse Rate:  [46-140] 112 (03/26 0819) Cardiac Rhythm:  [-] Sinus tachycardia (03/26 0512) Resp:  [16-92] 39 (03/26 0819) BP: (88-198)/(54-130) 99/60 mmHg (03/26 0819) SpO2:  [57 %-100 %] 100 % (03/26 0819) FiO2 (%):  [90 %-100 %] 90 % (03/26 0819) Weight:  [114.8 kg (253 lb 1.4 oz)] 114.8 kg (253 lb 1.4 oz) (03/26 0512)  CBC:  Recent Labs Lab 12/03/2015 0102 12/17/2015 0438  WBC 17.0* 17.0*  NEUTROABS 9.1*  --   HGB 13.0 13.7  HCT 40.1 43.0  MCV  69.7* 68.0*  PLT 289 321    Basic Metabolic Panel:  Recent Labs Lab 11/17/2015 0102 12/11/2015 0438  NA 132* 134*  K 3.6 3.9  CL 97* 98*  CO2 26 25  GLUCOSE 237* 262*  BUN 30* 31*  CREATININE 1.78* 1.78*  CALCIUM 7.9* 8.2*  MG  --  1.9  PHOS  --  6.4*    Lipid Panel:    Component Value Date/Time   CHOL 109 10/29/2015 0420   TRIG 41 11/29/2015 0548   HDL 34* 10/29/2015 0420   CHOLHDL 3.2 10/29/2015 0420   VLDL 12 10/29/2015 0420   LDLCALC 63 10/29/2015 0420   HgbA1c:  Lab Results  Component Value Date   HGBA1C 6.6* 10/29/2015   Urine Drug Screen:    Component Value Date/Time   LABOPIA POSITIVE* 01/08/2015 1200   COCAINSCRNUR NONE DETECTED 01/08/2015 1200   LABBENZ NONE DETECTED 01/08/2015 1200   AMPHETMU NONE DETECTED 01/08/2015 1200   THCU NONE DETECTED 01/08/2015 1200   LABBARB NONE DETECTED 01/08/2015 1200      IMAGING Ct Head Wo Contrast 12/11/2015   Right thalamic hemorrhage as well as intraventricular hemorrhages with mild mass effect and a 2 mm right-to-left midline shift. Diffuse effacement of the sulci with possible mild diffuse cerebral edema.   Dg Chest Portable 1 View 12/04/2015   Appliances appear to be in satisfactory location. Diffuse airspace disease throughout both lungs.    EEG 11/30/2015 Abnormal EEG in the comatose state demonstrating: 1. Burst suppression pattern (1-10 seconds of burst activity  with up to 10-30 seconds of suppression). 2. During the burst activity, there are frequent right frontal-temporal poly-spike and spike-and-wave discharges. These are associated with myoclonic jerks on video recording.   ROS:  Can not be obtained due to LOC  PHYSICAL EXAMONATION HEENT- Normocephalic, no lesions, without obvious abnormality. Normal external eye and extremely injected conjunctiva. Normal external nose, mucus membranes and septum. ET tube in place.  Neck supple with no masses, nodes, nodules or enlargement. Cardiovascular -  regular rate and rhythm, S1, S2 normal, no murmur, click, rub or gallop Lungs - coarse breath sounds on the left Abdomen - obese, soft, bowel sounds normal; no masses, no organomegaly Extremities - no joint deformities, effusion, or inflammation and no edema  Neurologic Examination: Patient was intubated and on mechanical ventilation.  He was unresponsive.  When there was external stimuli, patient was noted to have involuntary movement of the right upper extremity.  Likely myoclonus.  He was on Propofol.  Intermittently, there were generalized myoclonic jerks occurring every 20-30 seconds at lasting about 2-3 seconds.  Cranial Nerves Pupils were pinpoint and unreactive Extraocular movements are absent with oculocephalic maneuvers. Face was grossly symmetric Cough present  Motor/Sensory Intermittently, there were generalized myoclonic jerks occurring every 20-30 seconds at lasting about 2-3 seconds.  No movement on the left to noxious stimuli.  Minimal withdrawal to noxious stimuli   ASSESSMENT/PLAN Mr. Austin Mccarty is a 50 y.o. male with history of chronic kidney disease, community-acquired pneumonia, and hypertension, congestive heart failure, presenting with Confusion, dysarthria, and subsequent loss of consciousness. Patient required intubation and went into PEA arrest with resuscitation. Found to have a large right intracerebral hemorrhage.  He did not receive IV t-PA due to hemorrhage.  Stroke:  Non-dominant - large right intracerebral hemorrhage.  Resultant  Left hemiplegia, loss of conciousness  MRI - not performed  MRA - not performed  EEG - see above  Carotid Doppler - not indicated  2D Echo 10/29/2015 - EF 30-35%.  LDL 60 on 10/29/2015  HgbA1c Pending  VTE prophylaxis - SCDs  Diet NPO time specified  No antithrombotic prior to admission, now on No antithrombotic secondary to hemorrhage  Ongoing aggressive stroke risk factor  management  Hypertension  Managed with Cardene  Hyperlipidemia  Home meds:  No lipid lowering medications prior to admission  LDL 60, goal < 70  Diabetes  HgbA1c pending, goal < 7.0  Uncontrolled  Other Stroke Risk Factors  Cigarette smoker, quit smoking 8 years ago.  Obesity, Body mass index is 33.4 kg/(m^2).   Other Active Problems  Cardiomyopathy EF 30-35% by recent echo  Status post PEA arrest with resuscitation  Intubated on ventilator - sedated (? Pulmonary embolus)  UDS - opiates - received Narcan  Leukocytosis - 17,000 - vancomycin / Zosyn  Chronic kidney disease  Possible seizure activity - Keppra, Ativan, Depacon - EEG pending  Hypertension - Cardene  Hyperglycemia - probably undiagnosed diabetes mellitus  Hyponatremia - check serum sodium every 6 hours  Midline shift - mannitol - check serum osmolality every 4 hours - mannitol for osmolality greater than 320  Urine culture and blood cultures -  Pending  Repeat head CT - IMPRESSION: Right thalamic hemorrhage as well as intraventricular hemorrhages with mild mass effect and a 2 mm right-to-left midline shift.  Diffuse effacement of the sulci with possible mild diffuse cerebral edema.  Hospital day # 0  Delton See [facility] Pager [phone] 01-07-2016, 9:41 AM  CRITICAL CARE  NEUROLOGY ATTENDING NOTE Patient was seen and examined by me personally. I reviewed notes, independently viewed imaging studies, participated in medical decision making and plan of care. I have made additions or clarifications directly to the above note.  Documentation accurately reflects findings. The laboratory and radiographic studies were personally reviewed by me.  ROS pertinent positives could not be fully documented due to LOC  Assessment and plan completed by me personally and fully documented above.  Mannitol 25gm IV q6hr  Serum Osm and NA q6hr  Low wall suction  Condition is  unchanged   This patient is critically ill and at significant risk of neurological worsening, death and care requires constant monitoring of vital signs, hemodynamics,respiratory and cardiac monitoring, extensive review of multiple databases, frequent neurological assessment, discussion with family, other specialists and medical decision making of high complexity.  This critical care time does not reflect procedure time, or teaching time or supervisory time of PA/NP/Med Resident etc. but could involve care discussion time.  I spent 30 minutes of Neurocritical Care time in the care of  this patient.  SIGNED BY: Dr. Sula Sodahere Majesti Gambrell      To contact Stroke Continuity provider, please refer to [email or web address]. After hours, contact General Neurology

## 2015-12-12 NOTE — Code Documentation (Signed)
Pulse check, PEA, cpr resumed

## 2015-12-12 NOTE — ED Notes (Signed)
Pt reintubated with new tube, + color change

## 2015-12-13 ENCOUNTER — Inpatient Hospital Stay (HOSPITAL_COMMUNITY): Payer: Medicaid Other

## 2015-12-13 ENCOUNTER — Encounter: Payer: Self-pay | Admitting: Physician Assistant

## 2015-12-13 ENCOUNTER — Ambulatory Visit (HOSPITAL_COMMUNITY): Payer: Medicaid Other

## 2015-12-13 DIAGNOSIS — G936 Cerebral edema: Secondary | ICD-10-CM

## 2015-12-13 DIAGNOSIS — J9620 Acute and chronic respiratory failure, unspecified whether with hypoxia or hypercapnia: Secondary | ICD-10-CM

## 2015-12-13 DIAGNOSIS — G911 Obstructive hydrocephalus: Secondary | ICD-10-CM

## 2015-12-13 DIAGNOSIS — I629 Nontraumatic intracranial hemorrhage, unspecified: Secondary | ICD-10-CM | POA: Insufficient documentation

## 2015-12-13 DIAGNOSIS — I469 Cardiac arrest, cause unspecified: Secondary | ICD-10-CM

## 2015-12-13 DIAGNOSIS — I612 Nontraumatic intracerebral hemorrhage in hemisphere, unspecified: Secondary | ICD-10-CM

## 2015-12-13 DIAGNOSIS — R569 Unspecified convulsions: Secondary | ICD-10-CM

## 2015-12-13 DIAGNOSIS — G40901 Epilepsy, unspecified, not intractable, with status epilepticus: Secondary | ICD-10-CM

## 2015-12-13 DIAGNOSIS — I615 Nontraumatic intracerebral hemorrhage, intraventricular: Principal | ICD-10-CM

## 2015-12-13 DIAGNOSIS — I6789 Other cerebrovascular disease: Secondary | ICD-10-CM

## 2015-12-13 LAB — CBC WITH DIFFERENTIAL/PLATELET
Basophils Absolute: 0 10*3/uL (ref 0.0–0.1)
Basophils Relative: 0 %
EOS ABS: 0 10*3/uL (ref 0.0–0.7)
Eosinophils Relative: 0 %
HEMATOCRIT: 38 % — AB (ref 39.0–52.0)
HEMOGLOBIN: 12.1 g/dL — AB (ref 13.0–17.0)
LYMPHS ABS: 1.1 10*3/uL (ref 0.7–4.0)
Lymphocytes Relative: 5 %
MCH: 21.6 pg — AB (ref 26.0–34.0)
MCHC: 31.8 g/dL (ref 30.0–36.0)
MCV: 67.9 fL — ABNORMAL LOW (ref 78.0–100.0)
MONO ABS: 0.9 10*3/uL (ref 0.1–1.0)
MONOS PCT: 4 %
NEUTROS ABS: 19.2 10*3/uL — AB (ref 1.7–7.7)
NEUTROS PCT: 91 %
Platelets: 232 10*3/uL (ref 150–400)
RBC: 5.6 MIL/uL (ref 4.22–5.81)
RDW: 18 % — AB (ref 11.5–15.5)
WBC: 21.2 10*3/uL — ABNORMAL HIGH (ref 4.0–10.5)

## 2015-12-13 LAB — ECHOCARDIOGRAM COMPLETE
HEIGHTINCHES: 73 in
WEIGHTICAEL: 4063.52 [oz_av]

## 2015-12-13 LAB — PATHOLOGIST SMEAR REVIEW

## 2015-12-13 LAB — PROCALCITONIN: Procalcitonin: 13.69 ng/mL

## 2015-12-13 LAB — RENAL FUNCTION PANEL
Albumin: 2.7 g/dL — ABNORMAL LOW (ref 3.5–5.0)
Anion gap: 13 (ref 5–15)
BUN: 34 mg/dL — ABNORMAL HIGH (ref 6–20)
CHLORIDE: 102 mmol/L (ref 101–111)
CO2: 23 mmol/L (ref 22–32)
CREATININE: 2.41 mg/dL — AB (ref 0.61–1.24)
Calcium: 8.2 mg/dL — ABNORMAL LOW (ref 8.9–10.3)
GFR calc non Af Amer: 30 mL/min — ABNORMAL LOW (ref >60)
GFR, EST AFRICAN AMERICAN: 35 mL/min — AB (ref >60)
GLUCOSE: 126 mg/dL — AB (ref 65–99)
Phosphorus: 5.7 mg/dL — ABNORMAL HIGH (ref 2.5–4.6)
Potassium: 4.2 mmol/L (ref 3.5–5.1)
SODIUM: 138 mmol/L (ref 135–145)

## 2015-12-13 LAB — POCT I-STAT 3, VENOUS BLOOD GAS (G3P V)
ACID-BASE EXCESS: 3 mmol/L — AB (ref 0.0–2.0)
BICARBONATE: 28.2 meq/L — AB (ref 20.0–24.0)
O2 SAT: 82 %
PO2 VEN: 46 mmHg — AB (ref 31.0–45.0)
Patient temperature: 98
TCO2: 30 mmol/L (ref 0–100)
pCO2, Ven: 46.1 mmHg (ref 45.0–50.0)
pH, Ven: 7.393 — ABNORMAL HIGH (ref 7.250–7.300)

## 2015-12-13 LAB — HEMOGLOBIN A1C
Hgb A1c MFr Bld: 5.9 % — ABNORMAL HIGH (ref 4.8–5.6)
MEAN PLASMA GLUCOSE: 123 mg/dL

## 2015-12-13 LAB — SODIUM
SODIUM: 135 mmol/L (ref 135–145)
SODIUM: 138 mmol/L (ref 135–145)
SODIUM: 149 mmol/L — AB (ref 135–145)
Sodium: 141 mmol/L (ref 135–145)
Sodium: 143 mmol/L (ref 135–145)

## 2015-12-13 LAB — GLUCOSE, CAPILLARY
GLUCOSE-CAPILLARY: 142 mg/dL — AB (ref 65–99)
GLUCOSE-CAPILLARY: 93 mg/dL (ref 65–99)
Glucose-Capillary: 105 mg/dL — ABNORMAL HIGH (ref 65–99)
Glucose-Capillary: 122 mg/dL — ABNORMAL HIGH (ref 65–99)
Glucose-Capillary: 120 mg/dL — ABNORMAL HIGH (ref 65–99)
Glucose-Capillary: 92 mg/dL (ref 65–99)

## 2015-12-13 LAB — TRIGLYCERIDES: Triglycerides: 43 mg/dL (ref <150)

## 2015-12-13 LAB — URINE CULTURE: CULTURE: NO GROWTH

## 2015-12-13 LAB — VALPROIC ACID LEVEL: VALPROIC ACID LVL: 33 ug/mL — AB (ref 50.0–100.0)

## 2015-12-13 LAB — OSMOLALITY: Osmolality: 316 mOsm/kg — ABNORMAL HIGH (ref 275–295)

## 2015-12-13 LAB — MAGNESIUM: Magnesium: 1.8 mg/dL (ref 1.7–2.4)

## 2015-12-13 MED ORDER — WHITE PETROLATUM GEL
Status: AC
  Administered 2015-12-13: 0.2
  Filled 2015-12-13: qty 1

## 2015-12-13 MED ORDER — VALPROATE SODIUM 500 MG/5ML IV SOLN
500.0000 mg | Freq: Three times a day (TID) | INTRAVENOUS | Status: DC
  Administered 2015-12-13 – 2015-12-14 (×3): 500 mg via INTRAVENOUS
  Filled 2015-12-13 (×5): qty 5

## 2015-12-13 MED ORDER — SODIUM CHLORIDE 0.9 % IV SOLN
1500.0000 mg | Freq: Two times a day (BID) | INTRAVENOUS | Status: DC
  Administered 2015-12-13 – 2015-12-21 (×18): 1500 mg via INTRAVENOUS
  Filled 2015-12-13 (×20): qty 15

## 2015-12-13 MED ORDER — FENTANYL CITRATE (PF) 100 MCG/2ML IJ SOLN
25.0000 ug | INTRAMUSCULAR | Status: DC | PRN
  Administered 2015-12-13 – 2015-12-20 (×5): 100 ug via INTRAVENOUS
  Filled 2015-12-13 (×5): qty 2

## 2015-12-13 MED ORDER — VANCOMYCIN HCL 10 G IV SOLR
1250.0000 mg | INTRAVENOUS | Status: DC
  Administered 2015-12-13 – 2015-12-14 (×2): 1250 mg via INTRAVENOUS
  Filled 2015-12-13 (×4): qty 1250

## 2015-12-13 MED ORDER — LABETALOL HCL 5 MG/ML IV SOLN
10.0000 mg | INTRAVENOUS | Status: AC | PRN
  Administered 2015-12-13 – 2015-12-16 (×5): 20 mg via INTRAVENOUS
  Administered 2015-12-16: 10 mg via INTRAVENOUS
  Administered 2015-12-16 – 2015-12-19 (×4): 20 mg via INTRAVENOUS
  Filled 2015-12-13 (×11): qty 4

## 2015-12-13 MED ORDER — VALPROATE SODIUM 500 MG/5ML IV SOLN
1500.0000 mg | Freq: Once | INTRAVENOUS | Status: AC
  Administered 2015-12-13: 1500 mg via INTRAVENOUS
  Filled 2015-12-13: qty 15

## 2015-12-13 MED ORDER — SODIUM CHLORIDE 3 % IV SOLN
INTRAVENOUS | Status: DC
  Administered 2015-12-13 – 2015-12-14 (×4): 75 mL/h via INTRAVENOUS
  Filled 2015-12-13 (×22): qty 500

## 2015-12-13 MED ORDER — SODIUM CHLORIDE 0.9 % IV SOLN
1500.0000 mg | Freq: Two times a day (BID) | INTRAVENOUS | Status: DC
  Filled 2015-12-13: qty 15

## 2015-12-13 NOTE — Progress Notes (Signed)
PULMONARY / CRITICAL CARE MEDICINE   Name: Austin Mccarty MRN: [id number] DOB: 1966/03/21    ADMISSION DATE:  12/09/2015  CHIEF COMPLAINT:  PEA arrest  HISTORY OF PRESENT ILLNESS:   50 yo male with h/o HTN, HFrEF, CKD, DCM 2/2 HTN who presents from home with loss of consciousness.  Per report from friends and brother, he was in his usual state of health without any complaints.  He was with his friends today and became acutely encephalopathic, confused and dysarthric, he slurred his speech then fell back in his chair.  He arrived in the ED with sats in the 60's, emergently intubated then PEA arrest x27min then ROSC.  He was found to have a large right sided ICH with intraventricular hemorrhage likely 2/2 HTN.  SUBJECTIVE: remains intubated, critically ill  on Cardene infusion  & versed gtt for seizures  No other acute events since admission.  REVIEW OF SYSTEMS:  Unable to obtain given intubation and altered mental status with sedation.  VITAL SIGNS: Temp:  [98.4 F (36.9 C)-101.1 F (38.4 C)] 98.6 F (37 C) (03/27 0830) Pulse Rate:  [93-114] 102 (03/27 0830) Resp:  [12-39] 21 (03/27 0830) BP: (75-163)/(64-151) 162/94 mmHg (03/27 0830) SpO2:  [94 %-100 %] 95 % (03/27 0830) Arterial Line BP: (104-196)/(53-100) 196/100 mmHg (03/27 0830) FiO2 (%):  [40 %-80 %] 40 % (03/27 1040) Weight:  [253 lb 15.5 oz (115.2 kg)] 253 lb 15.5 oz (115.2 kg) (03/27 0123) HEMODYNAMICS:   VENTILATOR SETTINGS: Vent Mode:  [-] PRVC FiO2 (%):  [40 %-80 %] 40 % Set Rate:  [30 bmp] 30 bmp Vt Set:  [550 mL] 550 mL PEEP:  [10 cmH20-15 cmH20] 10 cmH20 Plateau Pressure:  [24 cmH20-29 cmH20] 28 cmH20 INTAKE / OUTPUT:  Intake/Output Summary (Last 24 hours) at 12/13/15 1100 Last data filed at 12/13/15 1000  Gross per 24 hour  Intake 2861.64 ml  Output   4135 ml  Net -1273.36 ml    PHYSICAL EXAMINATION: General:  Sedated. No acute distress. Brother at bedside.  Integument:  Warm & dry. No rash on  exposed skin.  HEENT:  No scleral injection or icterus. Endotracheal tube in place.  Cardiovascular:  Regular rate. Normal S1 & S2. No appreciable JVD.  Pulmonary:  Distant breath sounds bilaterally. Symmetric chest wall rise on ventilator. Abdomen: Soft. Normal bowel sounds. Nondistended.  Neurological:  Sedated. Pupils equal. No withdrawal to pain in extremities.  LABS:  CBC  Recent Labs Lab 12/11/2015 0102 11/20/2015 0438 12/13/15 0420  WBC 17.0* 17.0* 21.2*  HGB 13.0 13.7 12.1*  HCT 40.1 43.0 38.0*  PLT 289 321 232   Coag's  Recent Labs Lab 12/10/2015 0438  APTT 32  INR 1.14   BMET  Recent Labs Lab 12/14/2015 0102 11/22/2015 0438  11/27/2015 1813 12/13/15 12/13/15 0420  NA 132* 134*  < > 137 135 138  138  K 3.6 3.9  --   --   --  4.2  CL 97* 98*  --   --   --  102  CO2 26 25  --   --   --  23  BUN 30* 31*  --   --   --  34*  CREATININE 1.78* 1.78*  --   --   --  2.41*  GLUCOSE 237* 262*  --   --   --  126*  < > = values in this interval not displayed. Electrolytes  Recent Labs Lab 11/17/2015 0102 12/03/2015 4098 12/13/15 0420  CALCIUM 7.9* 8.2* 8.2*  MG  --  1.9 1.8  PHOS  --  6.4* 5.7*   Sepsis Markers  Recent Labs Lab 2016-08-16 0130 2016-08-16 0445 2016-08-16 0909 12/13/15 0420  LATICACIDVEN 5.16* 2.23*  --   --   PROCALCITON  --   --  6.55 13.69   ABG  Recent Labs Lab 2016-08-16 0135 2016-08-16 0519  PHART 7.009* 7.228*  PCO2ART 93.7* 64.0*  PO2ART 67.0* 76.0*   Liver Enzymes  Recent Labs Lab 2016-08-16 0102 12/13/15 0420  AST 41  --   ALT 47  --   ALKPHOS 48  --   BILITOT 0.5  --   ALBUMIN 2.5* 2.7*   Cardiac Enzymes No results for input(s): TROPONINI, PROBNP in the last 168 hours. Glucose  Recent Labs Lab 2016-08-16 1137 2016-08-16 1553 2016-08-16 2003 2016-08-16 2329 12/13/15 0358 12/13/15 0831  GLUCAP 72 111* 97 123* 105* 122*    Imaging No results found. EVENTS: 3/26 - Admit  STUDIES: TTE (10/29/15):  EF 30-35% w/ diffuse  hypokinesis. No AS or AR. Mild MR but no MS. RV moderately dilated w/ normal systolic function. LA severely dilated & RA mildly dilated. CT HEAD 2:19AM 3/26:  R Thalamic hemorrhage as well as IVH w/ mild mass effect & 2mm right to left midline shift. Possible mild diffuse cerebral edema. Port CXR 3/26: No appreciated pleural effusion. Endotracheal tube in acceptable position. Diffuse bilateral alveolar opacities.  MICROBIOLOGY: Blood Ctx x2 3/26>>> Urine Ctx 3/26>>> Tracheal Asp Ctx 3/26>>> Influenza PCR 3/26>>> Respiratory Viral Panel PCR 3/26>>> Urine Strep Ag 3/26>>> Urine Legionella Ag 3/26>>> MRSA PCR 3/26:  Negative   ANTIBIOTICS: Vancomycin 3/26>>> Zosyn 3/26>>>  LINES/TUBES: OETT 7.5 3/26>>> OGT 3/26>>> FOLEY 3/26>>> PIV X4  ASSESSMENT / PLAN:  NEUROLOGIC A:   Right sided ICH - Sub-falcine herniation. Intraventricular Hemorrhage Status epilepticus UDS neg P:   Neurology following RASS goal: 0 versed gtt per cEEG Keppra IV IVVPA IV SBP goal 120-140   PULMONARY A: Acute Hypoxic/ hypercarbic  Respiratory Failure - Secondary to pulmonary edema. Pulmonary Edema - Cardiogenic vs Neurogenic  P:   Full Vent Support    CARDIOVASCULAR A:  Possible Acute Systolic CHF Exacerbation - EF 30-35% 10/2015. Hypertensive Emergency  P:  Diuresis w/ lasix as BP tolerates Cardene gtt to maintain SBP 120-140 Monitor on telemetry Vitals per unit protocol  RENAL A:   Acute on Chronic Renal Failure - likely hypervolemic/cardiorenal syndrome. Lactic Acidosis - Resolving. Hyponatremia - Mild.  P:   Monitoring UOP with Foley Trending electrolytes & renal function daily Replace electrolytes as indicated  GASTROINTESTINAL A:   No acute issues.  P: NPO Protonix IV daily Start TFs  HEMATOLOGIC A:   Leukocytosis - Sepsis vs Stress Induced.  P:  Trending cell counts daily w/ CBC SCDs   INFECTIOUS A:   Possible CAP Possible UTI  P:   Empiric  Vancomycin & Zosyn Day #1 Procalcitonin per algorithm Awaiting culture results Checking Influenza PCR & Respiratory Panel PCR Checking Urine Legionella & Streptococcal Antigens  ENDOCRINE A:   Hyperglycemia - No h/o DM.  P:   Accu-Checks q4hr SSI per algorithm Hgb A1c pending  FAMILY UPDATES:  Brother updated at bedside by Dr. Jamison NeighborNestor & Neuro   TODAY'S SUMMARY:  [phone] with ICH likely hypertensive bleed, PEA arrest from hypoxemia and acute exacerbation of CHF vs neurogenic vs hypertensive pulmonary edema.  Now with myoclonic status in burst suppression. Starting hypertonic saline for worsening shift & neuro sx input  for hydrocephalus   The patient is critically ill with multiple organ systems failure and requires high complexity decision making for assessment and support, frequent evaluation and titration of therapies, application of advanced monitoring technologies and extensive interpretation of multiple databases. Critical Care Time devoted to patient care services described in this note independent of APP time is 35 minutes.   Cyril Mourning MD. Tonny Bollman. [facility] Pager [phone] If no response call [phone]      12/13/2015, 11:00 AM

## 2015-12-13 NOTE — Clinical Documentation Improvement (Signed)
Critical Care  Per H +P patient "Encephalopathic" in field, had "unresponsive" in ED and  "LOC" in progress notes, is please provide diagnosis for current mental status.  Thank you     Coma  Obtunded   Stupor  Other  Clinically Undetermined  Supporting Information: EEG "Abnormal EEG in the comatose state .Marland Kitchen...."    Please exercise your independent, professional judgment when responding. A specific answer is not anticipated or expected.   Thank You, Lavonda JumboLawanda J Jerald [facility] [place] [phone]

## 2015-12-13 NOTE — Clinical Documentation Improvement (Signed)
Critical Care  Can the documentation of "Pulmonary Edema --Cardiogenic vs Neurogenic" be further specified by the acuity?  Thank you    Acute  Chronic  Other  Clinically Undetermined   Noted in H + P, Critical Care prog nt 3/26 [place] MD  Please exercise your independent, professional judgment when responding. A specific answer is not anticipated or expected.   Thank You, Lavonda JumboLawanda J Revia Nghiem [facility] [place] [phone]

## 2015-12-13 NOTE — Consult Note (Signed)
Reason for Consult:obstructive hydrocephalus Referring Physician: [place]  Jushua Shraga Custard is an 50 y.o. male.  HPI: whom was admitted 3/26 due to an intracrebral hemorrhage with intraventricular extension. He is unresponsive, and intubated. I was called for ventricular drain placement. The family has had the situation explained and agree with this intervention.   Past Medical History  Diagnosis Date  . CKD (chronic kidney disease) stage 2, GFR 60-89 ml/min   . CAP (community acquired pneumonia) 01/08/2015  . Foreign body, eye 2009  . Hypertension     hx/pt 01/08/2015  . Chronic systolic (congestive) heart failure [facility])     Past Surgical History  Procedure Laterality Date  . Eye surgery Right 2009    "removed glass"    Family History  Problem Relation Age of Onset  . Hypertension Father     Social History:  reports that he quit smoking about 8 years ago. His smoking use included Cigarettes. He has a 8 pack-year smoking history. He has never used smokeless tobacco. He reports that he does not drink alcohol or use illicit drugs.  Allergies:  Allergies  Allergen Reactions  . Ibuprofen Itching and Swelling    Medications: I have reviewed the patient's current medications.  Results for orders placed or performed during the hospital encounter of 11/21/2015 (from the past 48 hour(s))  CBC with Differential     Status: Abnormal   Collection Time: 12/07/2015  1:02 AM  Result Value Ref Range   WBC 17.0 (H) 4.0 - 10.5 K/uL   RBC 5.75 4.22 - 5.81 MIL/uL   Hemoglobin 13.0 13.0 - 17.0 g/dL   HCT 40.1 39.0 - 52.0 %   MCV 69.7 ([facility]) 78.0 - 100.0 fL   MCH 22.6 ([facility]) 26.0 - 34.0 pg   MCHC 32.4 30.0 - 36.0 g/dL   RDW 17.9 (H) 11.5 - 15.5 %   Platelets 289 150 - 400 K/uL   Neutrophils Relative % 54 %   Lymphocytes Relative 40 %   Monocytes Relative 5 %   Eosinophils Relative 1 %   Basophils Relative 0 %   Neutro Abs 9.1 (H) 1.7 - 7.7 K/uL   Lymphs Abs 6.8 (H) 0.7 - 4.0 K/uL   Monocytes  Absolute 0.9 0.1 - 1.0 K/uL   Eosinophils Absolute 0.2 0.0 - 0.7 K/uL   Basophils Absolute 0.0 0.0 - 0.1 K/uL   RBC Morphology ELLIPTOCYTES     Comment: BURR CELLS   WBC Morphology ATYPICAL LYMPHOCYTES   Comprehensive metabolic panel     Status: Abnormal   Collection Time: 12/04/2015  1:02 AM  Result Value Ref Range   Sodium 132 ([facility]) 135 - 145 mmol/[facility]   Potassium 3.6 3.5 - 5.1 mmol/[facility]   Chloride 97 ([facility]) 101 - 111 mmol/[facility]   CO2 26 22 - 32 mmol/[facility]   Glucose, Bld 237 (H) 65 - 99 mg/dL   BUN 30 (H) 6 - 20 mg/dL   Creatinine, Ser 1.78 (H) 0.61 - 1.24 mg/dL   Calcium 7.9 ([facility]) 8.9 - 10.3 mg/dL   Total Protein 5.2 ([facility]) 6.5 - 8.1 g/dL   Albumin 2.5 ([facility]) 3.5 - 5.0 g/dL   AST 41 15 - 41 U/[facility]   ALT 47 17 - 63 U/[facility]   Alkaline Phosphatase 48 38 - 126 U/[facility]   Total Bilirubin 0.5 0.3 - 1.2 mg/dL   GFR calc non Af Amer 44 ([facility]) >60 mL/min   GFR calc Af Amer 51 ([facility]) >60 mL/min    Comment: (  NOTE) The eGFR has been calculated using the CKD EPI equation. This calculation has not been validated in all clinical situations. eGFR's persistently <60 mL/min signify possible Chronic Kidney Disease.    Anion gap 9 5 - 15  Pathologist smear review     Status: None   Collection Time: 11/21/2015  1:02 AM  Result Value Ref Range   Path Review ABSOLUTE LYMPHOCYTOSIS     Comment: Microcytosis. Reviewed by Chrystie Nose. Saralyn Pilar, M.D. 12/13/15.   I-Stat Troponin, ED (not at [facility])     Status: None   Collection Time: 12/11/2015  1:28 AM  Result Value Ref Range   Troponin i, poc 0.01 0.00 - 0.08 ng/mL   Comment 3            Comment: Due to the release kinetics of cTnI, a negative result within the first hours of the onset of symptoms does not rule out myocardial infarction with certainty. If myocardial infarction is still suspected, repeat the test at appropriate intervals.   I-Stat CG4 Lactic Acid, ED     Status: Abnormal   Collection Time: 12/06/2015  1:30 AM  Result Value Ref Range   Lactic Acid, Venous 5.16 (HH) 0.5 - 2.0  mmol/[facility]   Comment NOTIFIED PHYSICIAN   I-Stat Arterial Blood Gas, ED - (order at [facility] and [facility] only)     Status: Abnormal   Collection Time: 11/24/2015  1:35 AM  Result Value Ref Range   pH, Arterial 7.009 (LL) 7.350 - 7.450   pCO2 arterial 93.7 (HH) 35.0 - 45.0 mmHg   pO2, Arterial 67.0 ([facility]) 80.0 - 100.0 mmHg   Bicarbonate 23.6 20.0 - 24.0 mEq/[facility]   TCO2 26 0 - 100 mmol/[facility]   O2 Saturation 79.0 %   Acid-base deficit 10.0 (H) 0.0 - 2.0 mmol/[facility]   Patient temperature 98.6 F    Collection site RADIAL, ALLEN'S TEST ACCEPTABLE    Drawn by RT    Sample type ARTERIAL    Comment NOTIFIED PHYSICIAN   Blood culture (routine x 2)     Status: None (Preliminary result)   Collection Time: 12/09/2015  1:37 AM  Result Value Ref Range   Specimen Description BLOOD LEFT WRIST    Special Requests BOTTLES DRAWN AEROBIC AND ANAEROBIC 5CC EA    Culture NO GROWTH 1 DAY    Report Status PENDING   Blood culture (routine x 2)     Status: None (Preliminary result)   Collection Time: 11/19/2015  1:40 AM  Result Value Ref Range   Specimen Description BLOOD LEFT HAND    Special Requests BOTTLES DRAWN AEROBIC AND ANAEROBIC 5CC EA    Culture NO GROWTH 1 DAY    Report Status PENDING   CBG monitoring, ED     Status: Abnormal   Collection Time: 12/10/2015  1:46 AM  Result Value Ref Range   Glucose-Capillary 239 (H) 65 - 99 mg/dL  Urinalysis, Routine w reflex microscopic (not at [facility])     Status: Abnormal   Collection Time: 11/20/2015  1:50 AM  Result Value Ref Range   Color, Urine YELLOW YELLOW   APPearance TURBID (A) CLEAR   Specific Gravity, Urine 1.019 1.005 - 1.030   pH 7.0 5.0 - 8.0   Glucose, UA 250 (A) NEGATIVE mg/dL   Hgb urine dipstick SMALL (A) NEGATIVE   Bilirubin Urine NEGATIVE NEGATIVE   Ketones, ur NEGATIVE NEGATIVE mg/dL   Protein, ur >300 (A) NEGATIVE mg/dL   Nitrite NEGATIVE NEGATIVE   Leukocytes, UA  NEGATIVE NEGATIVE  Urine microscopic-add on     Status: Abnormal   Collection Time: 11/23/2015  1:50 AM   Result Value Ref Range   Squamous Epithelial / LPF 6-30 (A) NONE SEEN   WBC, UA 6-30 0 - 5 WBC/hpf   RBC / HPF 6-30 0 - 5 RBC/hpf   Bacteria, UA MANY (A) NONE SEEN   Sperm, UA PRESENT    Urine-Other YEAST PRESENT   Magnesium     Status: None   Collection Time: 11/21/2015  4:38 AM  Result Value Ref Range   Magnesium 1.9 1.7 - 2.4 mg/dL  Phosphorus     Status: Abnormal   Collection Time: 12/10/2015  4:38 AM  Result Value Ref Range   Phosphorus 6.4 (H) 2.5 - 4.6 mg/dL  APTT     Status: None   Collection Time: 11/25/2015  4:38 AM  Result Value Ref Range   aPTT 32 24 - 37 seconds  Protime-INR     Status: None   Collection Time: 11/24/2015  4:38 AM  Result Value Ref Range   Prothrombin Time 14.8 11.6 - 15.2 seconds   INR 1.14 0.00 - 1.49  CBC     Status: Abnormal   Collection Time: 11/28/2015  4:38 AM  Result Value Ref Range   WBC 17.0 (H) 4.0 - 10.5 K/uL   RBC 6.32 (H) 4.22 - 5.81 MIL/uL   Hemoglobin 13.7 13.0 - 17.0 g/dL   HCT 43.0 39.0 - 52.0 %   MCV 68.0 ([facility]) 78.0 - 100.0 fL   MCH 21.7 ([facility]) 26.0 - 34.0 pg   MCHC 31.9 30.0 - 36.0 g/dL   RDW 17.6 (H) 11.5 - 15.5 %   Platelets 321 150 - 400 K/uL    Comment: PLATELET COUNT CONFIRMED BY SMEAR  Basic metabolic panel     Status: Abnormal   Collection Time: 12/01/2015  4:38 AM  Result Value Ref Range   Sodium 134 ([facility]) 135 - 145 mmol/[facility]   Potassium 3.9 3.5 - 5.1 mmol/[facility]   Chloride 98 ([facility]) 101 - 111 mmol/[facility]   CO2 25 22 - 32 mmol/[facility]   Glucose, Bld 262 (H) 65 - 99 mg/dL   BUN 31 (H) 6 - 20 mg/dL   Creatinine, Ser 1.78 (H) 0.61 - 1.24 mg/dL   Calcium 8.2 ([facility]) 8.9 - 10.3 mg/dL   GFR calc non Af Amer 44 ([facility]) >60 mL/min   GFR calc Af Amer 51 ([facility]) >60 mL/min    Comment: (NOTE) The eGFR has been calculated using the CKD EPI equation. This calculation has not been validated in all clinical situations. eGFR's persistently <60 mL/min signify possible Chronic Kidney Disease.    Anion gap 11 5 - 15  Osmolality     Status: None   Collection Time: 11/30/2015   4:38 AM  Result Value Ref Range   Osmolality 295 275 - 295 mOsm/kg  I-Stat CG4 Lactic Acid, ED     Status: Abnormal   Collection Time: 12/05/2015  4:45 AM  Result Value Ref Range   Lactic Acid, Venous 2.23 (HH) 0.5 - 2.0 mmol/[facility]   Comment NOTIFIED PHYSICIAN   I-Stat arterial blood gas, ED     Status: Abnormal   Collection Time: 11/27/2015  5:19 AM  Result Value Ref Range   pH, Arterial 7.228 ([facility]) 7.350 - 7.450   pCO2 arterial 64.0 (HH) 35.0 - 45.0 mmHg   pO2, Arterial 76.0 ([facility]) 80.0 - 100.0 mmHg   Bicarbonate 27.0 (H) 20.0 - 24.0  mEq/[facility]   TCO2 29 0 - 100 mmol/[facility]   O2 Saturation 93.0 %   Acid-base deficit 3.0 (H) 0.0 - 2.0 mmol/[facility]   Patient temperature 35.9 C    Collection site RADIAL, ALLEN'S TEST ACCEPTABLE    Drawn by Operator    Sample type ARTERIAL    Comment NOTIFIED PHYSICIAN   MRSA PCR Screening     Status: None   Collection Time: 11/26/2015  5:40 AM  Result Value Ref Range   MRSA by PCR NEGATIVE NEGATIVE    Comment:        The GeneXpert MRSA Assay (FDA approved for NASAL specimens only), is one component of a comprehensive MRSA colonization surveillance program. It is not intended to diagnose MRSA infection nor to guide or monitor treatment for MRSA infections.   Triglycerides     Status: None   Collection Time: 11/18/2015  5:48 AM  Result Value Ref Range   Triglycerides 41 <150 mg/dL  Glucose, capillary     Status: Abnormal   Collection Time: 12/03/2015  5:49 AM  Result Value Ref Range   Glucose-Capillary 206 (H) 65 - 99 mg/dL  Glucose, capillary     Status: Abnormal   Collection Time: 11/25/2015  8:29 AM  Result Value Ref Range   Glucose-Capillary 57 ([facility]) 65 - 99 mg/dL  Osmolality     Status: Abnormal   Collection Time: 12/02/2015  9:08 AM  Result Value Ref Range   Osmolality 297 (H) 275 - 295 mOsm/kg  Procalcitonin - Baseline     Status: None   Collection Time: 12/06/2015  9:09 AM  Result Value Ref Range   Procalcitonin 6.55 ng/mL    Comment:         Interpretation: PCT > 2 ng/mL: Systemic infection (sepsis) is likely, unless other causes are known. (NOTE)         ICU PCT Algorithm               Non ICU PCT Algorithm    ----------------------------     ------------------------------         PCT < 0.25 ng/mL                 PCT < 0.1 ng/mL     Stopping of antibiotics            Stopping of antibiotics       strongly encouraged.               strongly encouraged.    ----------------------------     ------------------------------       PCT level decrease by               PCT < 0.25 ng/mL       >= 80% from peak PCT       OR PCT 0.25 - 0.5 ng/mL          Stopping of antibiotics                                             encouraged.     Stopping of antibiotics           encouraged.    ----------------------------     ------------------------------       PCT level decrease by              PCT >= 0.25 ng/mL       <  80% from peak PCT        AND PCT >= 0.5 ng/mL            Continuing antibiotics                                               encouraged.       Continuing antibiotics            encouraged.    ----------------------------     ------------------------------     PCT level increase compared          PCT > 0.5 ng/mL         with peak PCT AND          PCT >= 0.5 ng/mL             Escalation of antibiotics                                          strongly encouraged.      Escalation of antibiotics        strongly encouraged.   Hemoglobin A1c     Status: Abnormal   Collection Time: 12/09/2015  9:09 AM  Result Value Ref Range   Hgb A1c MFr Bld 5.9 (H) 4.8 - 5.6 %    Comment: (NOTE)         Pre-diabetes: 5.7 - 6.4         Diabetes: >6.4         Glycemic control for adults with diabetes: <7.0    Mean Plasma Glucose 123 mg/dL    Comment: (NOTE) Performed At: [facility] [address] [place], [place] [id number] Lindon Romp MD YK:[id number]   Glucose, capillary     Status: None   Collection Time: 12/02/2015   9:16 AM  Result Value Ref Range   Glucose-Capillary 92 65 - 99 mg/dL  Influenza panel by PCR (type A & B, H1N1)     Status: None   Collection Time: 12/01/2015  9:30 AM  Result Value Ref Range   Influenza A By PCR NEGATIVE NEGATIVE   Influenza B By PCR NEGATIVE NEGATIVE   H1N1 flu by pcr NOT DETECTED NOT DETECTED    Comment:        The Xpert Flu assay (FDA approved for nasal aspirates or washes and nasopharyngeal swab specimens), is intended as an aid in the diagnosis of influenza and should not be used as a sole basis for treatment.   Culture, respiratory (NON-Expectorated)     Status: None (Preliminary result)   Collection Time: 11/27/2015  9:42 AM  Result Value Ref Range   Specimen Description TRACHEAL ASPIRATE    Special Requests NONE    Gram Stain      MODERATE WBC PRESENT,BOTH PMN AND MONONUCLEAR NO SQUAMOUS EPITHELIAL CELLS SEEN FEW GRAM POSITIVE COCCI IN PAIRS Performed at [facility]    Culture NO GROWTH Performed at [facility]     Report Status PENDING   Urine rapid drug screen (hosp performed)     Status: None   Collection Time: 12/02/2015  9:59 AM  Result Value Ref Range   Opiates NONE DETECTED NONE DETECTED   Cocaine NONE DETECTED NONE DETECTED   Benzodiazepines NONE DETECTED  NONE DETECTED   Amphetamines NONE DETECTED NONE DETECTED   Tetrahydrocannabinol NONE DETECTED NONE DETECTED   Barbiturates NONE DETECTED NONE DETECTED    Comment:        DRUG SCREEN FOR MEDICAL PURPOSES ONLY.  IF CONFIRMATION IS NEEDED FOR ANY PURPOSE, NOTIFY LAB WITHIN 5 DAYS.        LOWEST DETECTABLE LIMITS FOR URINE DRUG SCREEN Drug Class       Cutoff (ng/mL) Amphetamine      1000 Barbiturate      200 Benzodiazepine   161 Tricyclics       096 Opiates          300 Cocaine          300 THC              50   Culture, Urine     Status: None   Collection Time: 12/02/2015 10:00 AM  Result Value Ref Range   Specimen Description URINE, CATHETERIZED    Special  Requests NONE    Culture NO GROWTH 1 DAY    Report Status 12/13/2015 FINAL   Strep pneumoniae urinary antigen     Status: None   Collection Time: 12/15/2015 10:02 AM  Result Value Ref Range   Strep Pneumo Urinary Antigen NEGATIVE NEGATIVE    Comment:        Infection due to S. pneumoniae cannot be absolutely ruled out since the antigen present may be below the detection limit of the test.   Glucose, capillary     Status: None   Collection Time: 12/05/2015 11:37 AM  Result Value Ref Range   Glucose-Capillary 72 65 - 99 mg/dL  Osmolality     Status: Abnormal   Collection Time: 11/20/2015 11:41 AM  Result Value Ref Range   Osmolality 296 (H) 275 - 295 mOsm/kg  Sodium     Status: Abnormal   Collection Time: 12/13/2015 11:41 AM  Result Value Ref Range   Sodium 133 ([facility]) 135 - 145 mmol/[facility]  Glucose, capillary     Status: Abnormal   Collection Time: 11/19/2015  3:53 PM  Result Value Ref Range   Glucose-Capillary 111 (H) 65 - 99 mg/dL  Osmolality     Status: Abnormal   Collection Time: 12/01/2015  6:13 PM  Result Value Ref Range   Osmolality 308 (H) 275 - 295 mOsm/kg  Sodium     Status: None   Collection Time: 11/29/2015  6:13 PM  Result Value Ref Range   Sodium 137 135 - 145 mmol/[facility]  Glucose, capillary     Status: None   Collection Time: 12/01/2015  8:03 PM  Result Value Ref Range   Glucose-Capillary 97 65 - 99 mg/dL  Osmolality     Status: Abnormal   Collection Time: 12/01/2015  9:00 PM  Result Value Ref Range   Osmolality 302 (H) 275 - 295 mOsm/kg  Glucose, capillary     Status: Abnormal   Collection Time: 12/11/2015 11:29 PM  Result Value Ref Range   Glucose-Capillary 123 (H) 65 - 99 mg/dL  Sodium     Status: None   Collection Time: 12/13/15 12:00 AM  Result Value Ref Range   Sodium 135 135 - 145 mmol/[facility]  Osmolality     Status: Abnormal   Collection Time: 12/13/15 12:40 AM  Result Value Ref Range   Osmolality 316 (H) 275 - 295 mOsm/kg  Glucose, capillary     Status: Abnormal   Collection  Time: 12/13/15  3:58 AM  Result Value Ref Range   Glucose-Capillary 105 (H) 65 - 99 mg/dL  Sodium     Status: None   Collection Time: 12/13/15  4:20 AM  Result Value Ref Range   Sodium 138 135 - 145 mmol/[facility]  Renal function panel     Status: Abnormal   Collection Time: 12/13/15  4:20 AM  Result Value Ref Range   Sodium 138 135 - 145 mmol/[facility]   Potassium 4.2 3.5 - 5.1 mmol/[facility]   Chloride 102 101 - 111 mmol/[facility]   CO2 23 22 - 32 mmol/[facility]   Glucose, Bld 126 (H) 65 - 99 mg/dL   BUN 34 (H) 6 - 20 mg/dL   Creatinine, Ser 2.41 (H) 0.61 - 1.24 mg/dL   Calcium 8.2 ([facility]) 8.9 - 10.3 mg/dL   Phosphorus 5.7 (H) 2.5 - 4.6 mg/dL   Albumin 2.7 ([facility]) 3.5 - 5.0 g/dL   GFR calc non Af Amer 30 ([facility]) >60 mL/min   GFR calc Af Amer 35 ([facility]) >60 mL/min    Comment: (NOTE) The eGFR has been calculated using the CKD EPI equation. This calculation has not been validated in all clinical situations. eGFR's persistently <60 mL/min signify possible Chronic Kidney Disease.    Anion gap 13 5 - 15  CBC with Differential/Platelet     Status: Abnormal   Collection Time: 12/13/15  4:20 AM  Result Value Ref Range   WBC 21.2 (H) 4.0 - 10.5 K/uL   RBC 5.60 4.22 - 5.81 MIL/uL   Hemoglobin 12.1 ([facility]) 13.0 - 17.0 g/dL   HCT 38.0 ([facility]) 39.0 - 52.0 %   MCV 67.9 ([facility]) 78.0 - 100.0 fL   MCH 21.6 ([facility]) 26.0 - 34.0 pg   MCHC 31.8 30.0 - 36.0 g/dL   RDW 18.0 (H) 11.5 - 15.5 %   Platelets 232 150 - 400 K/uL   Neutrophils Relative % 91 %   Neutro Abs 19.2 (H) 1.7 - 7.7 K/uL   Lymphocytes Relative 5 %   Lymphs Abs 1.1 0.7 - 4.0 K/uL   Monocytes Relative 4 %   Monocytes Absolute 0.9 0.1 - 1.0 K/uL   Eosinophils Relative 0 %   Eosinophils Absolute 0.0 0.0 - 0.7 K/uL   Basophils Relative 0 %   Basophils Absolute 0.0 0.0 - 0.1 K/uL  Magnesium     Status: None   Collection Time: 12/13/15  4:20 AM  Result Value Ref Range   Magnesium 1.8 1.7 - 2.4 mg/dL  Procalcitonin     Status: None   Collection Time: 12/13/15  4:20 AM  Result Value Ref Range    Procalcitonin 13.69 ng/mL    Comment:        Interpretation: PCT >= 10 ng/mL: Important systemic inflammatory response, almost exclusively due to severe bacterial sepsis or septic shock. (NOTE)         ICU PCT Algorithm               Non ICU PCT Algorithm    ----------------------------     ------------------------------         PCT < 0.25 ng/mL                 PCT < 0.1 ng/mL     Stopping of antibiotics            Stopping of antibiotics       strongly encouraged.               strongly encouraged.    ----------------------------     ------------------------------  PCT level decrease by               PCT < 0.25 ng/mL       >= 80% from peak PCT       OR PCT 0.25 - 0.5 ng/mL          Stopping of antibiotics                                             encouraged.     Stopping of antibiotics           encouraged.    ----------------------------     ------------------------------       PCT level decrease by              PCT >= 0.25 ng/mL       < 80% from peak PCT        AND PCT >= 0.5 ng/mL             Continuing antibiotics                                              encouraged.       Continuing antibiotics            encouraged.    ----------------------------     ------------------------------     PCT level increase compared          PCT > 0.5 ng/mL         with peak PCT AND          PCT >= 0.5 ng/mL             Escalation of antibiotics                                          strongly encouraged.      Escalation of antibiotics        strongly encouraged.   Triglycerides     Status: None   Collection Time: 12/13/15  4:20 AM  Result Value Ref Range   Triglycerides 43 <150 mg/dL  Valproic acid level     Status: Abnormal   Collection Time: 12/13/15  4:20 AM  Result Value Ref Range   Valproic Acid Lvl 33 ([facility]) 50.0 - 100.0 ug/mL  Glucose, capillary     Status: Abnormal   Collection Time: 12/13/15  8:31 AM  Result Value Ref Range   Glucose-Capillary 122 (H) 65 - 99  mg/dL   Comment 1 Notify RN    Comment 2 Document in Chart   Sodium     Status: None   Collection Time: 12/13/15 12:06 PM  Result Value Ref Range   Sodium 141 135 - 145 mmol/[facility]  Glucose, capillary     Status: Abnormal   Collection Time: 12/13/15 12:18 PM  Result Value Ref Range   Glucose-Capillary 120 (H) 65 - 99 mg/dL   Comment 1 Notify RN    Comment 2 Document in Chart   I-STAT 3, venous blood gas (G3P V)     Status: Abnormal   Collection Time: 12/13/15  2:13 PM  Result Value Ref Range  pH, Ven 7.393 (H) 7.250 - 7.300   pCO2, Ven 46.1 45.0 - 50.0 mmHg   pO2, Ven 46.0 (H) 31.0 - 45.0 mmHg   Bicarbonate 28.2 (H) 20.0 - 24.0 mEq/[facility]   TCO2 30 0 - 100 mmol/[facility]   O2 Saturation 82.0 %   Acid-Base Excess 3.0 (H) 0.0 - 2.0 mmol/[facility]   Patient temperature 98.0 F    Sample type VENOUS     Ct Head Wo Contrast  12/13/2015  CLINICAL DATA:  Hemorrhagic stroke.  Hypertension. EXAM: CT HEAD WITHOUT CONTRAST TECHNIQUE: Contiguous axial images were obtained from the base of the skull through the vertex without intravenous contrast. COMPARISON:  CT head without contrast 3/ 26/17. FINDINGS: The right basal ganglia hemorrhage is similar in size, measuring 3.6 x 2.5 cm. It extends into both ventricles. There is layering blood within the posterior horns of the lateral ventricles but laterally. Hemorrhage is present in the third and fourth ventricles. The temporal tips are more prominent on today's study suggesting developing hydrocephalus. Midline shift is stable to slightly increased, now measuring 7 mm. Partial effacement of the basal cisterns is stable. No new hemorrhage is present. A fluid level is present in the right maxillary sinus and sphenoid sinuses. Mild diffuse mucosal thickening is present in the ethmoid air cells and frontal sinuses. The mastoid air cells are clear. The globes and orbits are intact. The calvarium is unremarkable. IMPRESSION: 1. Stable size of right basal ganglia hemorrhage. 2. Slight  increase in midline shift, now measuring 7 mm. 3. Increased prominence of the temporal horns of the lateral ventricles compatible with developing hydrocephalus. These results were called by telephone at the time of interpretation on 12/13/2015 at 10:51 am to Dr. Erlinda Hong, who verbally acknowledged these results. Electronically Signed   By: San Morelle M.D.   On: 12/13/2015 12:02   Ct Head Wo Contrast  12/06/2015  ADDENDUM REPORT: 12/03/2015 02:39 ADDENDUM: These results were called by telephone at the time of interpretation on 12/08/2015 at 2:39 am to Dr. Thayer Jew , who verbally acknowledged these results. Electronically Signed   By: Anner Crete M.D.   On: 11/26/2015 02:39  11/24/2015  CLINICAL DATA:  50 year old male with altered mental status. Hypertension. EXAM: CT HEAD WITHOUT CONTRAST TECHNIQUE: Contiguous axial images were obtained from the base of the skull through the vertex without intravenous contrast. COMPARISON:  None. FINDINGS: There is a 2.6 x 3.7 cm right thalamic hemorrhage. Intraventricular extension of blood noted in the lateral ventricles, third, and fourth ventricles. There is mild mass effect and mild compression of the third ventricle and approximately 2 mm right-to-left midline shift. There is diffuse effacement of the sulci likely related to mass effect caused by right thalamic and intraventricular hemorrhages. There is apparent mild diffuse hypoattenuation of the brain parenchyma which may represent degree of diffuse cerebral edema. The gray-white matter discrimination is however preserved. There is diffuse mucoperiosteal thickening and partial opacification of the paranasal sinuses. The mastoid air cells are clear. The calvarium is intact. IMPRESSION: Right thalamic hemorrhage as well as intraventricular hemorrhages with mild mass effect and a 2 mm right-to-left midline shift. Diffuse effacement of the sulci with possible mild diffuse cerebral edema. Electronically Signed:  By: Anner Crete M.D. On: 12/13/2015 02:29   Dg Chest Port 1 View  12/13/2015  CLINICAL DATA:  Placement of left-sided central line EXAM: PORTABLE CHEST 1 VIEW COMPARISON:  Prior chest x-ray 10/27/2015 ; prior CT scan of the chest 10/04/2015 FINDINGS: The patient is  intubated. The tip of the endotracheal tube is 5 cm above the carina. A nasogastric tube is been placed. The tip lies off the field of view presumably within the stomach. A new left IJ approach central venous catheter has been placed. The catheter tip descends inferiorly along the left aspect of the spine and does not severe toward the midline. This is concerning for arterial placement. If arterial, the tip is likely within the descending thoracic aorta. If venous, this may be within a branch vessels such as the internal mammary vein. Stable cardiomegaly. Mediastinal contours remain unchanged. No pneumothorax or pleural effusion. Persistent diffuse bilateral interstitial and airspace opacities. IMPRESSION: 1. New left IJ approach central venous catheter with the tip left of midline and overlying the thoracic aorta. The imaging appearance concerning for arterial placement in which case the tip of the catheter is likely in the proximal descending thoracic aorta. If the catheter is venous in location, then is likely within a branch vein such as the internal mammary vein. These results were called by telephone at the time of interpretation on 12/13/2015 at 12:40 pm to the patient's nurse, Holland Commons, who verbally acknowledged these results and will relay the message to Dr. Heber Chalmette. 2. The tip of the endotracheal tube is 5 cm above the carina. 3. The tip of the nasogastric tube lies below the field of view and is presumably within the stomach. 4. Similar appearance of the lungs with diffuse bilateral interstitial and airspace opacities. Electronically Signed   By: Jacqulynn Cadet M.D.   On: 12/13/2015 12:42   Dg Chest Portable 1 View  11/18/2015   CLINICAL DATA:  Post code with intubation EXAM: PORTABLE CHEST 1 VIEW COMPARISON:  06/25/2016 FINDINGS: Endotracheal tube placed with tip measuring 3.9 cm above the carina. Enteric tube tip is off the field of view but below the left hemidiaphragm. Shallow inspiration. Cardiac enlargement. Diffuse bilateral pulmonary infiltration consistent with airspace disease. This could represent edema, pneumonia, or aspiration. Old right rib fractures. IMPRESSION: Appliances appear to be in satisfactory location. Diffuse airspace disease throughout both lungs. Electronically Signed   By: Lucienne Capers M.D.   On: 11/30/2015 01:38    Review of Systems  Unable to perform ROS: mental acuity   Blood pressure 178/93, pulse 116, temperature 98.1 F (36.7 C), temperature source Core (Comment), resp. rate 30, height '6\' 1"'$  (1.854 m), weight 115.2 kg (253 lb 15.5 oz), SpO2 96 %. Physical Exam  Constitutional: He appears well-developed.  HENT:  Head: Normocephalic and atraumatic.  Neurological: He is unresponsive.  Currently sedated, intubated.  Pupils pinpoint, minimally reactive No cough, gag No response to peripheral noxious stimuli Unable to assess sensation, strength, coordination    Assessment/Plan: Intracerebral hematoma with intraventricular extension and obstructive hydrocephalus. I agree with ventricular catheter placement and have discussed with his family.   Austin Mccarty 12/13/2015, 3:09 PM

## 2015-12-13 NOTE — Procedures (Signed)
Central Venous Catheter Insertion Procedure Note Maudry DiegoMohialdin H. Woolf [id number] 02/29/1966  Procedure: Insertion of Central Venous Catheter Indications: Assessment of intravascular volume, Drug and/or fluid administration and Frequent blood sampling  Procedure Details Consent: Risks of procedure as well as the alternatives and risks of each were explained to the (patient/caregiver).  Consent for procedure obtained. Time Out: Verified patient identification, verified procedure, site/side was marked, verified correct patient position, special equipment/implants available, medications/allergies/relevent history reviewed, required imaging and test results available.  Performed  Maximum sterile technique was used including antiseptics, cap, gloves, gown, hand hygiene, mask and sheet. Skin prep: Chlorhexidine; local anesthetic administered A antimicrobial bonded/coated triple lumen catheter was placed in the left internal jugular vein using the Seldinger technique. Ultrasound guidance used.Yes.   Catheter placed to 20 cm. Blood aspirated via all 3 ports and then flushed x 3. Line sutured x 2 and dressing applied.  Evaluation Blood flow good Complications: No apparent complications Patient did tolerate procedure well. Chest X-ray ordered to verify placement.  CXR: pending.  Joneen RoachPaul Wana Mount, AGACNP-BC [facility] Pager [phone] or [phone]  12/13/2015 12:03 PM

## 2015-12-13 NOTE — Progress Notes (Signed)
  Echocardiogram 2D Echocardiogram has been performed.  Arvil ChacoFoster, Pennye Beeghly 12/13/2015, 3:51 PM

## 2015-12-13 NOTE — Progress Notes (Signed)
Radiologist called me with CXR results. I called them to Dr. Vassie LollAlva and Renae FicklePaul PA. IJ transduced, appropriate-looking CVP waveform seen, reading 12mmHg. Gas sent, see results, consistent with venous sample. Order obtained from NerstrandPaul PA to use L IJ.

## 2015-12-13 NOTE — Progress Notes (Deleted)
Cardiology Office Note:    Date:  12/13/2015   ID:  Austin Mccarty, DOB April 09, 1966, MRN [id number]  PCP:  Arnoldo Morale, MD  Cardiologist:  Dr. Sherren Mocha   Electrophysiologist:  n/a  No chief complaint on file.   History of Present Illness:     Austin Mccarty is a 50 y.o. [place] male with a hx of HFrEF, CKD, HTN.  Dilated cardiomyopathy is presumed to be secondary to uncontrolled hypertension. Ejection fraction has been ~ 35%.  Admitted in 8/67 with a/c systolic CHF in the setting of multifocal pneumonia.  He has been followed at the [facility] and [facility] (has seen Dr. Verl Blalock).    Admitted 2/8-2/13 with acute hypoxic respiratory failure secondary to acute on chronic systolic CHF and multifocal pneumonia.  He was followed by cardiology. He was discharged on a combination of carvedilol, furosemide, hydralazine, isosorbide, losartan, spironolactone.   Last seen by me 11/08/15. He returns for follow-up.  He is doing well.  The patient denies chest pain, shortness of breath, syncope, orthopnea, PND or significant pedal edema.  His cough is much better. His PCP refilled Lasix 80 mg bid (started taking this 4-5 days ago). When I saw him last, he was on Lasix 40 mg bid.     Past Medical History  Diagnosis Date  . CKD (chronic kidney disease) stage 2, GFR 60-89 ml/min   . CAP (community acquired pneumonia) 01/08/2015  . Foreign body, eye 2009  . Hypertension     hx/pt 01/08/2015  . Chronic systolic (congestive) heart failure [facility])     Past Surgical History  Procedure Laterality Date  . Eye surgery Right 2009    "removed glass"    Current Medications: Facility-Administered Medications Prior to Visit  Medication Dose Route Frequency Provider Last Rate Last Dose  . 0.9 %  sodium chloride infusion  250 mL Intravenous PRN Roswell Nickel, MD      . 0.9 %  sodium chloride infusion   Intra-arterial PRN Javier Glazier, MD      . acetaminophen  (TYLENOL) solution 650 mg  650 mg Per Tube Q6H PRN Javier Glazier, MD   650 mg at 12/13/2015 1459  . antiseptic oral rinse solution (CORINZ)  7 mL Mouth Rinse 10 times per day Javier Glazier, MD   7 mL at 12/13/15 1054  . chlorhexidine gluconate (SAGE KIT) (PERIDEX) 0.12 % solution 15 mL  15 mL Mouth Rinse BID Javier Glazier, MD   15 mL at 12/15/2015 1952  . fentaNYL (SUBLIMAZE) injection 25-100 mcg  25-100 mcg Intravenous Q2H PRN Kara Mead V, MD      . insulin aspart (novoLOG) injection 0-20 Units  0-20 Units Subcutaneous 6 times per day Roswell Nickel, MD   3 Units at 12/13/15 0052  . labetalol (NORMODYNE,TRANDATE) injection 10-20 mg  10-20 mg Intravenous Q2H PRN Kara Mead V, MD      . levETIRAcetam (KEPPRA) 1,500 mg in sodium chloride 0.9 % 100 mL IVPB  1,500 mg Intravenous Q12H Rosalin Hawking, MD   1,500 mg at 12/13/15 1052  . LORazepam (ATIVAN) injection 1-4 mg  1-4 mg Intravenous Q1H PRN Javier Glazier, MD   2 mg at 11/28/2015 1130  . midazolam (VERSED) 100 mg in sodium chloride 0.9 % 100 mL (1 mg/mL) infusion  23 mg/hr Intravenous Continuous Javier Glazier, MD   23 mg/hr at 12/13/15 1113  . nicardipine (CARDENE) 75m in 0.86% saline 2034m  IV infusion (0.1 mg/ml)  3-15 mg/hr Intravenous Continuous Roswell Nickel, MD   15 mg/hr at 12/13/15 1313  . pantoprazole (PROTONIX) injection 40 mg  40 mg Intravenous Q24H Javier Glazier, MD   40 mg at 12/13/15 1884  . phenylephrine (NEO-SYNEPHRINE) 10 mg in dextrose 5 % 250 mL (0.04 mg/mL) infusion  0-100 mcg/min Intravenous Titrated Javier Glazier, MD   Stopped at 12/13/15 [phone]  . piperacillin-tazobactam (ZOSYN) IVPB 3.375 g  3.375 g Intravenous Q8H Kris Mouton, RPH   3.375 g at 12/13/15 6301  . propofol (DIPRIVAN) 1000 MG/100ML infusion  5-80 mcg/kg/min Intravenous Titrated Wallie Char   Stopped at 12/14/2015 1545  . sodium chloride (hypertonic) 3 % solution   Intravenous Continuous Donzetta Starch, NP   75 mL/hr at 12/13/15 1313  .  valproate (DEPACON) 500 mg in dextrose 5 % 50 mL IVPB  500 mg Intravenous 3 times per day Rosalin Hawking, MD      . vancomycin (VANCOCIN) 1,250 mg in sodium chloride 0.9 % 250 mL IVPB  1,250 mg Intravenous Q24H Roswell Nickel, MD       Outpatient Prescriptions Prior to Visit  Medication Sig Dispense Refill  . carvedilol (COREG) 25 MG tablet Take 1 tablet (25 mg total) by mouth 2 (two) times daily with a meal. 180 tablet 3  . fluticasone (FLONASE) 50 MCG/ACT nasal spray Place 2 sprays into both nostrils daily. 16 g 2  . furosemide (LASIX) 80 MG tablet Take 80 mg by mouth daily.     . hydrALAZINE (APRESOLINE) 50 MG tablet Take 1 tablet (50 mg total) by mouth 3 (three) times daily. 270 tablet 3  . isosorbide mononitrate (IMDUR) 60 MG 24 hr tablet Take 1 tablet (60 mg total) by mouth daily. 90 tablet 3  . losartan (COZAAR) 100 MG tablet Take 1 tablet (100 mg total) by mouth daily. 30 tablet 0  . potassium chloride SA (K-DUR,KLOR-CON) 10 MEQ tablet Take 1 tablet (10 mEq total) by mouth daily. 90 tablet 3  . spironolactone (ALDACTONE) 25 MG tablet Take 1 tablet (25 mg total) by mouth daily. 90 tablet 3     Allergies:   Ibuprofen   Social History   Social History  . Marital Status: Married    Spouse Name: N/A  . Number of Children: N/A  . Years of Education: N/A   Social History Main Topics  . Smoking status: Former Smoker -- 1.00 packs/day for 8 years    Types: Cigarettes    Quit date: 10/25/2007  . Smokeless tobacco: Never Used     Comment: "quit smoking in ~ 2009  . Alcohol Use: No  . Drug Use: No  . Sexual Activity: Yes   Other Topics Concern  . Not on file   Social History Narrative     Family History:  The patient's family history includes Hypertension in his father.   ROS:   Please see the history of present illness.    Review of Systems  Respiratory: Positive for cough.    All other systems reviewed and are negative.   Physical Exam:    VS:  There were no vitals  taken for this visit.   GEN: Well nourished, well developed, in no acute distress HEENT: normal Neck: no JVD, no masses Cardiac: Normal S1/S2, RRR; no murmurs,  no edema;     Respiratory:  clear to auscultation bilaterally; no wheezing, rhonchi or rales GI: soft, nontender  MS: no deformity or  atrophy Skin: warm and dry  Neuro:    no focal deficits  Psych: Alert and oriented x 3, normal affect  Wt Readings from Last 3 Encounters:  12/13/15 253 lb 15.5 oz (115.2 kg)  11/22/15 253 lb (114.76 kg)  11/15/15 247 lb 12.8 oz (112.401 kg)      Studies/Labs Reviewed:     EKG:  EKG is not ordered today.  The ekg ordered today demonstrates n/a  Recent Labs: 01/12/2015: Pro B Natriuretic peptide (BNP) 2840.00* 10/27/2015: B Natriuretic Peptide 582.5* 11/01/2015: TSH 2.001 11/19/2015: ALT 47 12/13/2015: BUN 34*; Creatinine, Ser 2.41*; Hemoglobin 12.1*; Magnesium 1.8; Platelets 232; Potassium 4.2; Sodium 141   Recent Lipid Panel    Component Value Date/Time   CHOL 109 10/29/2015 0420   TRIG 43 12/13/2015 0420   HDL 34* 10/29/2015 0420   CHOLHDL 3.2 10/29/2015 0420   VLDL 12 10/29/2015 0420   LDLCALC 63 10/29/2015 0420    Additional studies/ records that were reviewed today include:   Echo 10/29/15 EF 30-35%, diffuse HK, mild MR, severe LAE, moderate RVE, mild RAE, trivial pericardial effusion  Echo 4/16 Moderate LVH, EF 35-40%, diffuse HK, moderate MR, mild BAE, moderate TR, PASP 59 mmHg   ASSESSMENT:     No diagnosis found.  PLAN:     In order of problems listed above:  1. Chronic combined systolic and diastolic CHF - Volume stable. He is NYHA 2.  He would be a good candidate for Entresto given his multiple recent admissions with acute on chronic CHF and LVEF less than 40%. However, he has no insurance and his pharmacy at the [facility] does not cover it.  I will try to see if we can get paperwork submitted. If he can get the drug on  discount or for free, I will change his Losartan to Entresto.  Continue hydralazine, nitrates, losartan, spironolactone.  Continue current dose of Lasix (80 mg bid) for now.  Check BMET today.  If BUN/Creatinine higher, decrease back to 40 mg bid.   2. HTN Heart Disease - BP remains out of control.    -  Increase Spironolactone to 25 mg QD  -  Decrease K+ to 10 mEq QD  -  Increase Hydralazine to 50 mg TID  -  Increase Imdur to 60 mg QD  -  BMET 1 week.   3. CKD Stage 2 - Repeat BMET today.  4. Dilated CM - Likely related to HTN.  Will need to repeat Echo once meds optimized.  If EF remains low, consider ischemic evaluation.      Medication Adjustments/Labs and Tests Ordered: Current medicines are reviewed at length with the patient today.  Concerns regarding medicines are outlined above.  Medication changes, Labs and Tests ordered today are outlined in the Patient Instructions noted below. There are no Patient Instructions on file for this visit. Signed, Richardson Dopp, PA-C  12/13/2015 1:19 PM    [facility] [address], [place], [place]  [address] Phone: [phone]; Fax: [phone]

## 2015-12-13 NOTE — Progress Notes (Signed)
STROKE TEAM PROGRESS NOTE   SUBJECTIVE (INTERVAL HISTORY) His brother was at the bedside. LTM EEG showed no seizure overnight, took off to perform repeat CT head. Repeat CT showed stable hematoma but developed hydrocephalus. NSG Dr. Franky Macho contacted for EVD placement. Maximized keepra and depakote for seizure control. He is still on high dose of versed drip.    OBJECTIVE Temp:  [98.1 F (36.7 C)-101.1 F (38.4 C)] 98.1 F (36.7 C) (03/27 1230) Pulse Rate:  [93-116] 116 (03/27 1230) Cardiac Rhythm:  [-] Sinus tachycardia (03/26 2000) Resp:  [12-36] 30 (03/27 1230) BP: (75-178)/(64-151) 178/93 mmHg (03/27 1230) SpO2:  [92 %-100 %] 96 % (03/27 1230) Arterial Line BP: (104-196)/(53-100) 181/79 mmHg (03/27 1230) FiO2 (%):  [40 %-70 %] 40 % (03/27 1040) Weight:  [253 lb 15.5 oz (115.2 kg)] 253 lb 15.5 oz (115.2 kg) (03/27 0123)  CBC:   Recent Labs Lab 11/26/2015 0102 12/08/2015 0438 12/13/15 0420  WBC 17.0* 17.0* 21.2*  NEUTROABS 9.1*  --  19.2*  HGB 13.0 13.7 12.1*  HCT 40.1 43.0 38.0*  MCV 69.7* 68.0* 67.9*  PLT 289 321 232    Basic Metabolic Panel:  Recent Labs Lab 12/09/2015 0438  12/13/15 0420 12/13/15 1206  NA 134*  < > 138  138 141  K 3.9  --  4.2  --   CL 98*  --  102  --   CO2 25  --  23  --   GLUCOSE 262*  --  126*  --   BUN 31*  --  34*  --   CREATININE 1.78*  --  2.41*  --   CALCIUM 8.2*  --  8.2*  --   MG 1.9  --  1.8  --   PHOS 6.4*  --  5.7*  --   < > = values in this interval not displayed.  Lipid Panel:     Component Value Date/Time   CHOL 109 10/29/2015 0420   TRIG 43 12/13/2015 0420   HDL 34* 10/29/2015 0420   CHOLHDL 3.2 10/29/2015 0420   VLDL 12 10/29/2015 0420   LDLCALC 63 10/29/2015 0420   HgbA1c:  Lab Results  Component Value Date   HGBA1C 5.9* 12/02/2015   Urine Drug Screen:     Component Value Date/Time   LABOPIA NONE DETECTED 11/22/2015 0959   COCAINSCRNUR NONE DETECTED 12/16/2015 0959   LABBENZ NONE DETECTED 11/23/2015 0959   AMPHETMU NONE DETECTED 11/24/2015 0959   THCU NONE DETECTED 12/16/2015 0959   LABBARB NONE DETECTED 11/17/2015 0959      IMAGING I have personally reviewed the radiological images below and agree with the radiology interpretations.  Ct Head Wo Contrast 12/08/2015  Right thalamic hemorrhage as well as intraventricular hemorrhages with mild mass effect and a 2 mm right-to-left midline shift. Diffuse effacement of the sulci with possible mild diffuse cerebral edema.   12/13/2015  IMPRESSION: 1. Stable size of right basal ganglia hemorrhage. 2. Slight increase in midline shift, now measuring 7 mm. 3. Increased prominence of the temporal horns of the lateral ventricles compatible with developing hydrocephalus.   Dg Chest Portable 1 View 11/24/2015   Appliances appear to be in satisfactory location. Diffuse airspace disease throughout both lungs.   EEG 12/09/2015 Abnormal EEG in the comatose state demonstrating: 1. Burst suppression pattern (1-10 seconds of burst activity with up to 10-30 seconds of suppression). 2. During the burst activity, there are frequent right frontal-temporal poly-spike and spike-and-wave discharges. These are associated with myoclonic jerks  on video recording.  LTM EEG 12/13/15 This EEG is consistent with diffuse non-specific cerebral dysfunction, with epileptiform discharges in the right hemisphere that improve though the duration of the recording. No electrographic seizures were seen.  PHYSICAL EXAMONATION  Temp:  [98.1 F (36.7 C)-101.1 F (38.4 C)] 98.1 F (36.7 C) (03/27 1230) Pulse Rate:  [93-116] 116 (03/27 1230) Resp:  [12-36] 30 (03/27 1230) BP: (75-178)/(64-151) 178/93 mmHg (03/27 1230) SpO2:  [92 %-100 %] 96 % (03/27 1230) Arterial Line BP: (104-196)/(53-100) 181/79 mmHg (03/27 1230) FiO2 (%):  [40 %-70 %] 40 % (03/27 1040) Weight:  [253 lb 15.5 oz (115.2 kg)] 253 lb 15.5 oz (115.2 kg) (03/27 0123)  General - Well nourished, well developed, intubated  and sedated.  Ophthalmologic - Fundi not visualized due to ET positioning.  Cardiovascular - Regular rhythm, but tachycardia.  Neuro - intubated and sedated, not respond to voice. PERRL, sluggish, but no dolls eyes or corneal, with weak gag. Right UE trace withdraw on pain, no movement on other limbs on pain stimulation. DTR diminished and no babinski. Sensation, coordination and gait not tested.   ASSESSMENT/PLAN Mr. Austin Mccarty is a 50 y.o. male with history of chronic kidney disease, community-acquired pneumonia, and hypertension, congestive heart failure, presenting with confusion, dysarthria, and subsequent loss of consciousness. Patient required intubation and went into PEA arrest for 15 min with resuscitation. Found to have a right BG ICH with IVH.  He did not receive IV t-PA due to hemorrhage.  Stroke:  right BG intracerebral hemorrhage with ventricular extension.  CT showed right BG ICH with IVH  Repeat CT head showed stable hematoma but obstructive hydrocephalus  2D Echo pending but 10/29/2015 - EF 30-35%.  LDL 60 on 10/29/2015  HgbA1c Pending  VTE prophylaxis - SCDs Diet NPO time specified  No antithrombotic prior to admission, now on No antithrombotic secondary to hemorrhage.  Ongoing aggressive stroke risk factor management  Disposition - pending  Obstructive hydrocephalus  CT repeat showed above findings  NSG called for EVD consideration  Family agrees with EVD placement  Seizure  Initial stat EEG burst suppression with myoclonic status  Put on versed drip high dose - continue  Put on keppra and depakote  LTM EEG showed no seizure but slowing  Maximize keppra to  bid  depakote load today and increase to  Q8  depakote level daily  Consider LTM EEG during versed weaning off  Cerebral edema  CT repeat showed increase midline shift  D/c mannitol  Put on 3% saline after central line  Na Q6h  Na goal 150-155  Cardiac  arrest  Likely related to ICH  Resuscitated  Burst suppression on EEG but suppressed on versed  NSR now  respiratory failure and aspiration pneumonia  Intubated   CCM on board  On vanco and zosyn   Leukocytosis 17.0->21.2  Hypertension  Managed with Cardene   Developed with hypotension and on Neo, now off  BP goal < 160  Other Stroke Risk Factors  Cigarette smoker, quit smoking 8 years ago.  Obesity, Body mass index is 33.51 kg/(m^2).   Other Active Problems  Cardiomyopathy EF 30-35% by recent echo  Chronic kidney disease, Cre 2.41  Hospital day # 1  This patient is critically ill due to right ICH and IVH, hydrocephalus, seizure, cardiac arrest, cerebral edema and at significant risk of neurological worsening, death form recurrent bleeding, brain herniation, heart failure, cardiac arrest, hydrocephalus. This patient's care requires constant monitoring of vital signs,  hemodynamics, respiratory and cardiac monitoring, review of multiple databases, neurological assessment, discussion with family, other specialists and medical decision making of high complexity. I spent 50 minutes of neurocritical care time in the care of this patient.  Marvel PlanJindong Dawson Hollman, MD PhD Stroke Neurology 12/13/2015 2:51 PM    To contact Stroke Continuity provider, please refer to [email or web address]. After hours, contact General Neurology

## 2015-12-13 NOTE — Progress Notes (Signed)
LTM EEG D/C'd per Dr Roda ShuttersXu

## 2015-12-13 NOTE — Op Note (Signed)
 *   No surgery found *  PATIENT:   Austin Mccarty  50 y.o. male with acute obstructive hydrocephalus.   PRE-OPERATIVE DIAGNOSIS: Acute obstructive hydrocephalus  POST-OPERATIVE DIAGNOSIS:  Same  PROCEDURE:  Right frontal ventricular catheter placement  SURGEON:  Mauriana Dann ANESTHESIA:   local DRAINS: Ventriculostomy Drain in the Right lateral ventricle.   SPECIMEN: none  DICTATION: Austin Mccarty had his head shaved and then prepared in a sterile manner. I draped him in a sterile manner. I injected lidocaine into the planned coronal incision in line with the right pupillary line anterior to the tragus. I opened the skin with a 15 blade. I used the hand twist drill to create a burr hole. I opened the dura with the 20 gauge spinal needle. I passed the ventricular catheter into the lateral ventricle. There was brisk flow of spinal fluid. I tunneled the catheter posteriorly and secured it to the skin at the exit. I approximated the scalp edges with nylon sutures. I placed a sterile dressing, then connected the catheter to the drainage system.   PLAN OF CARE: Admit to inpatient   PATIENT DISPOSITION:  PACU - hemodynamically stable.

## 2015-12-13 NOTE — Progress Notes (Signed)
Patient transported to CT and back to room 3M05 without any complications.

## 2015-12-13 NOTE — Procedures (Signed)
Electroencephalogram report- LTM  Ordering Physician : Dr. Otelia LimesLindzen EEG number: [id number]    Beginning date or time: 2016/06/24 2:53PM Ending date or time: 12/13/2015 9:30AM  Day of study: day 1  Medications include: Per EMR   HISTORY: This 24 hours of intensive EEG monitoring with simultaneous video monitoring was performed for this patient with intracranial hemorrhage and altered mental status. This EEG was requested to rule out subclinical electrographic seizures.  TECHNICAL DESCRIPTION:  The study consists of a continuous 16-channel multi-montage digital video EEG recording with twenty-one electrodes placed according to the International 10-20 System. Additional leads included eye leads, true temporal leads (T1, T2), and an EKG lead. Activation procedures were not done due to mental status.  REPORT: The background activity in this tracing consisted of polymorphic delta and theta background, with best of about 5 Hz. During the initial portion of the recording, about 0.5-1Hz  periodic discharges were seen in the right hemispheric region, with some reflection on the left. These become less prominent through the duration of the recording. No clear electrographic seizures were seen.  IMPRESSION: This is an abnormal EEG due to: 1) Periodic discharges over the right hemisphere that improve over the duration of the recording  2) Diffuse slowing.  CLINICAL CORRELATION: This EEG is consistent with diffuse non-specific cerebral dysfunction, with epileptiform discharges in the right hemisphere that improve though the duration of the recording. No electrographic seizures were seen.

## 2015-12-13 NOTE — Care Management Note (Signed)
Case Management Note  Patient Details  Name: Austin Mccarty MRN: [id number] Date of Birth: 09/27/1965  Subjective/Objective:   Pt admitted on 12/04/2015 s/p Rt basal ganglia intracranial hemorrhage with intraventricular extension.  PTA, pt independent of ADLS, lives with family.                 Action/Plan: Pt remains currently sedated and on ventilator.  Will follow for discharge planning as pt progresses.    Expected Discharge Date:                  Expected Discharge Plan:     In-House Referral:     Discharge planning Services  CM Consult  Post Acute Care Choice:    Choice offered to:     DME Arranged:    DME Agency:     HH Arranged:    HH Agency:     Status of Service:  In process, will continue to follow  Medicare Important Message Given:    Date Medicare IM Given:    Medicare IM give by:    Date Additional Medicare IM Given:    Additional Medicare Important Message give by:     If discussed at Long Length of Stay Meetings, dates discussed:    Additional Comments:  Quintella BatonJulie W. Shellene Sweigert, RN, BSN  Trauma/Neuro ICU Case Manager [phone]

## 2015-12-14 ENCOUNTER — Inpatient Hospital Stay (HOSPITAL_COMMUNITY): Payer: Medicaid Other

## 2015-12-14 DIAGNOSIS — I10 Essential (primary) hypertension: Secondary | ICD-10-CM

## 2015-12-14 DIAGNOSIS — J9621 Acute and chronic respiratory failure with hypoxia: Secondary | ICD-10-CM

## 2015-12-14 DIAGNOSIS — I255 Ischemic cardiomyopathy: Secondary | ICD-10-CM

## 2015-12-14 DIAGNOSIS — I629 Nontraumatic intracranial hemorrhage, unspecified: Secondary | ICD-10-CM

## 2015-12-14 LAB — BASIC METABOLIC PANEL
ANION GAP: 9 (ref 5–15)
BUN: 32 mg/dL — ABNORMAL HIGH (ref 6–20)
CHLORIDE: 120 mmol/L — AB (ref 101–111)
CO2: 25 mmol/L (ref 22–32)
Calcium: 9 mg/dL (ref 8.9–10.3)
Creatinine, Ser: 2.26 mg/dL — ABNORMAL HIGH (ref 0.61–1.24)
GFR calc non Af Amer: 33 mL/min — ABNORMAL LOW (ref >60)
GFR, EST AFRICAN AMERICAN: 38 mL/min — AB (ref >60)
Glucose, Bld: 142 mg/dL — ABNORMAL HIGH (ref 65–99)
POTASSIUM: 3.6 mmol/L (ref 3.5–5.1)
Sodium: 154 mmol/L — ABNORMAL HIGH (ref 135–145)

## 2015-12-14 LAB — CBC
HEMATOCRIT: 35.4 % — AB (ref 39.0–52.0)
Hemoglobin: 11.1 g/dL — ABNORMAL LOW (ref 13.0–17.0)
MCH: 21.8 pg — ABNORMAL LOW (ref 26.0–34.0)
MCHC: 31.4 g/dL (ref 30.0–36.0)
MCV: 69.5 fL — ABNORMAL LOW (ref 78.0–100.0)
PLATELETS: 208 10*3/uL (ref 150–400)
RBC: 5.09 MIL/uL (ref 4.22–5.81)
RDW: 18.5 % — AB (ref 11.5–15.5)
WBC: 14.1 10*3/uL — AB (ref 4.0–10.5)

## 2015-12-14 LAB — BLOOD GAS, ARTERIAL
ACID-BASE EXCESS: 1 mmol/L (ref 0.0–2.0)
BICARBONATE: 25.7 meq/L — AB (ref 20.0–24.0)
Drawn by: 398991
FIO2: 0.4
LHR: 20 {breaths}/min
MECHVT: 550 mL
O2 SAT: 93.5 %
PATIENT TEMPERATURE: 100.4
PCO2 ART: 47.9 mmHg — AB (ref 35.0–45.0)
PEEP/CPAP: 5 cmH2O
PH ART: 7.356 (ref 7.350–7.450)
TCO2: 27.1 mmol/L (ref 0–100)
pO2, Arterial: 84.3 mmHg (ref 80.0–100.0)

## 2015-12-14 LAB — VALPROIC ACID LEVEL: VALPROIC ACID LVL: 48 ug/mL — AB (ref 50.0–100.0)

## 2015-12-14 LAB — URINALYSIS W MICROSCOPIC (NOT AT ARMC)
Bilirubin Urine: NEGATIVE
GLUCOSE, UA: NEGATIVE mg/dL
HGB URINE DIPSTICK: NEGATIVE
KETONES UR: NEGATIVE mg/dL
LEUKOCYTES UA: NEGATIVE
Nitrite: NEGATIVE
PROTEIN: NEGATIVE mg/dL
SQUAMOUS EPITHELIAL / LPF: NONE SEEN
Specific Gravity, Urine: 1.014 (ref 1.005–1.030)
pH: 7 (ref 5.0–8.0)

## 2015-12-14 LAB — SODIUM
Sodium: 157 mmol/L — ABNORMAL HIGH (ref 135–145)
Sodium: 160 mmol/L — ABNORMAL HIGH (ref 135–145)
Sodium: 162 mmol/L (ref 135–145)

## 2015-12-14 LAB — LEGIONELLA PNEUMOPHILA SEROGP 1 UR AG: L. PNEUMOPHILA SEROGP 1 UR AG: NEGATIVE

## 2015-12-14 LAB — GLUCOSE, CAPILLARY
GLUCOSE-CAPILLARY: 105 mg/dL — AB (ref 65–99)
GLUCOSE-CAPILLARY: 103 mg/dL — AB (ref 65–99)
GLUCOSE-CAPILLARY: 89 mg/dL (ref 65–99)
Glucose-Capillary: 122 mg/dL — ABNORMAL HIGH (ref 65–99)
Glucose-Capillary: 106 mg/dL — ABNORMAL HIGH (ref 65–99)
Glucose-Capillary: 101 mg/dL — ABNORMAL HIGH (ref 65–99)

## 2015-12-14 LAB — CULTURE, RESPIRATORY

## 2015-12-14 LAB — TRIGLYCERIDES: Triglycerides: 53 mg/dL (ref <150)

## 2015-12-14 LAB — CULTURE, RESPIRATORY W GRAM STAIN: Culture: NO GROWTH

## 2015-12-14 LAB — PROCALCITONIN: Procalcitonin: 5.72 ng/mL

## 2015-12-14 MED ORDER — FREE WATER
250.0000 mL | Freq: Once | Status: AC
Start: 2015-12-15 — End: 2015-12-15
  Administered 2015-12-15: 250 mL

## 2015-12-14 MED ORDER — ISOSORBIDE MONONITRATE ER 30 MG PO TB24
60.0000 mg | ORAL_TABLET | Freq: Every day | ORAL | Status: DC
  Filled 2015-12-14: qty 2

## 2015-12-14 MED ORDER — LOSARTAN POTASSIUM 50 MG PO TABS
100.0000 mg | ORAL_TABLET | Freq: Every day | ORAL | Status: DC
  Administered 2015-12-14 – 2015-12-26 (×13): 100 mg via ORAL
  Filled 2015-12-14 (×14): qty 2

## 2015-12-14 MED ORDER — HYDRALAZINE HCL 50 MG PO TABS
50.0000 mg | ORAL_TABLET | Freq: Three times a day (TID) | ORAL | Status: DC
  Administered 2015-12-14 (×3): 50 mg via ORAL
  Filled 2015-12-14 (×3): qty 1

## 2015-12-14 MED ORDER — VALPROATE SODIUM 500 MG/5ML IV SOLN
1000.0000 mg | Freq: Three times a day (TID) | INTRAVENOUS | Status: DC
  Administered 2015-12-14 – 2015-12-15 (×4): 1000 mg via INTRAVENOUS
  Filled 2015-12-14 (×6): qty 10

## 2015-12-14 MED ORDER — VALPROATE SODIUM 500 MG/5ML IV SOLN
2000.0000 mg | Freq: Once | INTRAVENOUS | Status: AC
  Administered 2015-12-14: 2000 mg via INTRAVENOUS
  Filled 2015-12-14: qty 20

## 2015-12-14 MED ORDER — NICARDIPINE HCL IN NACL 40-0.83 MG/200ML-% IV SOLN
3.0000 mg/h | INTRAVENOUS | Status: DC
  Administered 2015-12-14: 7.5 mg/h via INTRAVENOUS
  Administered 2015-12-14 (×2): 15 mg/h via INTRAVENOUS
  Administered 2015-12-14: 12.5 mg/h via INTRAVENOUS
  Administered 2015-12-15: 7.5 mg/h via INTRAVENOUS
  Administered 2015-12-16: 3 mg/h via INTRAVENOUS
  Administered 2015-12-17: 7.5 mg/h via INTRAVENOUS
  Filled 2015-12-14 (×7): qty 200

## 2015-12-14 MED ORDER — VITAL HIGH PROTEIN PO LIQD
1000.0000 mL | ORAL | Status: DC
  Administered 2015-12-14: 17:00:00
  Administered 2015-12-15: 1000 mL
  Administered 2015-12-15: 08:00:00
  Administered 2015-12-15 (×2): 1000 mL
  Administered 2015-12-15: 12:00:00
  Administered 2015-12-16 – 2015-12-17 (×2): 1000 mL

## 2015-12-14 MED ORDER — CARVEDILOL 12.5 MG PO TABS
25.0000 mg | ORAL_TABLET | Freq: Two times a day (BID) | ORAL | Status: DC
  Administered 2015-12-14 – 2015-12-26 (×23): 25 mg via ORAL
  Filled 2015-12-14 (×24): qty 2

## 2015-12-14 MED ORDER — PANTOPRAZOLE SODIUM 40 MG PO PACK
40.0000 mg | PACK | Freq: Every day | ORAL | Status: DC
  Administered 2015-12-14 – 2015-12-26 (×12): 40 mg
  Filled 2015-12-14 (×12): qty 20

## 2015-12-14 MED ORDER — VITAL HIGH PROTEIN PO LIQD
1000.0000 mL | ORAL | Status: DC
  Administered 2015-12-14: 1000 mL

## 2015-12-14 NOTE — Progress Notes (Signed)
PULMONARY / CRITICAL CARE MEDICINE   Name: Austin Mccarty MRN: [id number] DOB: 08/20/1966    ADMISSION DATE:  12/05/2015  CHIEF COMPLAINT:  PEA arrest  HISTORY OF PRESENT ILLNESS:   50 yo male with h/o HTN, HFrEF, CKD, DCM 2/2 HTN who presented 3/26 from home with loss of consciousness.  Marland Kitchen.  He arrived in the ED with sats in the 60's, emergently intubated then PEA arrest x7115min then ROSC.  He was found to have a large right sided ICH with intraventricular hemorrhage likely 2/2 HTN.  SUBJECTIVE: remains intubated, critically ill  on Cardene infusion  & versed gtt for seizures    REVIEW OF SYSTEMS:  Unable to obtain given intubation and altered mental status with sedation.  VITAL SIGNS: Temp:  [97.9 F (36.6 C)-101.1 F (38.4 C)] 101.1 F (38.4 C) (03/28 0800) Pulse Rate:  [93-121] 118 (03/28 0745) Resp:  [0-37] 30 (03/28 0800) BP: (107-181)/(61-93) 159/72 mmHg (03/28 0800) SpO2:  [92 %-100 %] 98 % (03/28 0800) Arterial Line BP: (112-204)/(52-80) 150/67 mmHg (03/28 0800) FiO2 (%):  [40 %] 40 % (03/28 0745) Weight:  [250 lb 10.6 oz (113.7 kg)] 250 lb 10.6 oz (113.7 kg) (03/28 0424) HEMODYNAMICS: CVP:  [10 mmHg-29 mmHg] 12 mmHg VENTILATOR SETTINGS: Vent Mode:  [-] PRVC FiO2 (%):  [40 %] 40 % Set Rate:  [30 bmp] 30 bmp Vt Set:  [550 mL] 550 mL PEEP:  [5 cmH20-10 cmH20] 5 cmH20 Plateau Pressure:  [22 cmH20-28 cmH20] 24 cmH20 INTAKE / OUTPUT:  Intake/Output Summary (Last 24 hours) at 12/14/15 0925 Last data filed at 12/14/15 0800  Gross per 24 hour  Intake 4770.16 ml  Output   5311 ml  Net -540.84 ml    PHYSICAL EXAMINATION: General:  Sedated. Ventilated, acutely ill Brother at bedside.  Integument:  Warm & dry. No rash on exposed skin.  HEENT:  No scleral injection or icterus. Endotracheal tube in place.  Cardiovascular:  Regular rate. Normal S1 & S2. No appreciable JVD.  Pulmonary:  Distant breath sounds bilaterally. Symmetric chest wall rise on ventilator. Abdomen:  Soft. Normal bowel sounds. Nondistended.  Neurological:  Sedated. Pupils equal. No withdrawal to pain in extremities.  LABS:  CBC  Recent Labs Lab 11/29/2015 0438 12/13/15 0420 12/14/15 0412  WBC 17.0* 21.2* 14.1*  HGB 13.7 12.1* 11.1*  HCT 43.0 38.0* 35.4*  PLT 321 232 208   Coag's  Recent Labs Lab 12/15/2015 0438  APTT 32  INR 1.14   BMET  Recent Labs Lab 12/07/2015 0438  12/13/15 0420  12/13/15 1550 12/13/15 2259 12/14/15 0412  NA 134*  < > 138  138  < > 143 149* 154*  K 3.9  --  4.2  --   --   --  3.6  CL 98*  --  102  --   --   --  120*  CO2 25  --  23  --   --   --  25  BUN 31*  --  34*  --   --   --  32*  CREATININE 1.78*  --  2.41*  --   --   --  2.26*  GLUCOSE 262*  --  126*  --   --   --  142*  < > = values in this interval not displayed. Electrolytes  Recent Labs Lab 12/14/2015 0438 12/13/15 0420 12/14/15 0412  CALCIUM 8.2* 8.2* 9.0  MG 1.9 1.8  --   PHOS 6.4* 5.7*  --  Sepsis Markers  Recent Labs Lab Dec 17, 2015 0130 12/17/2015 0445 12/17/2015 0909 12/13/15 0420 12/14/15 0412  LATICACIDVEN 5.16* 2.23*  --   --   --   PROCALCITON  --   --  6.55 13.69 5.72   ABG  Recent Labs Lab 12-17-15 0135 12/17/15 0519  PHART 7.009* 7.228*  PCO2ART 93.7* 64.0*  PO2ART 67.0* 76.0*   Liver Enzymes  Recent Labs Lab 12/17/15 0102 12/13/15 0420  AST 41  --   ALT 47  --   ALKPHOS 48  --   BILITOT 0.5  --   ALBUMIN 2.5* 2.7*   Cardiac Enzymes No results for input(s): TROPONINI, PROBNP in the last 168 hours. Glucose  Recent Labs Lab 12/13/15 1218 12/13/15 1550 12/13/15 2006 12/13/15 2311 12/14/15 0311 12/14/15 0745  GLUCAP 120* 142* 93 92 122* 105*    Imaging Ct Head Wo Contrast  12/13/2015  CLINICAL DATA:  Hemorrhagic stroke.  Hypertension. EXAM: CT HEAD WITHOUT CONTRAST TECHNIQUE: Contiguous axial images were obtained from the base of the skull through the vertex without intravenous contrast. COMPARISON:  CT head without contrast 3/  26/17. FINDINGS: The right basal ganglia hemorrhage is similar in size, measuring 3.6 x 2.5 cm. It extends into both ventricles. There is layering blood within the posterior horns of the lateral ventricles but laterally. Hemorrhage is present in the third and fourth ventricles. The temporal tips are more prominent on today's study suggesting developing hydrocephalus. Midline shift is stable to slightly increased, now measuring 7 mm. Partial effacement of the basal cisterns is stable. No new hemorrhage is present. A fluid level is present in the right maxillary sinus and sphenoid sinuses. Mild diffuse mucosal thickening is present in the ethmoid air cells and frontal sinuses. The mastoid air cells are clear. The globes and orbits are intact. The calvarium is unremarkable. IMPRESSION: 1. Stable size of right basal ganglia hemorrhage. 2. Slight increase in midline shift, now measuring 7 mm. 3. Increased prominence of the temporal horns of the lateral ventricles compatible with developing hydrocephalus. These results were called by telephone at the time of interpretation on 12/13/2015 at 10:51 am to Dr. Roda Shutters, who verbally acknowledged these results. Electronically Signed   By: Marin Roberts M.D.   On: 12/13/2015 12:02   Dg Chest Port 1 View  12/13/2015  CLINICAL DATA:  Placement of left-sided central line EXAM: PORTABLE CHEST 1 VIEW COMPARISON:  Prior chest x-ray 10/27/2015 ; prior CT scan of the chest 10/04/2015 FINDINGS: The patient is intubated. The tip of the endotracheal tube is 5 cm above the carina. A nasogastric tube is been placed. The tip lies off the field of view presumably within the stomach. A new left IJ approach central venous catheter has been placed. The catheter tip descends inferiorly along the left aspect of the spine and does not severe toward the midline. This is concerning for arterial placement. If arterial, the tip is likely within the descending thoracic aorta. If venous, this may be  within a branch vessels such as the internal mammary vein. Stable cardiomegaly. Mediastinal contours remain unchanged. No pneumothorax or pleural effusion. Persistent diffuse bilateral interstitial and airspace opacities. IMPRESSION: 1. New left IJ approach central venous catheter with the tip left of midline and overlying the thoracic aorta. The imaging appearance concerning for arterial placement in which case the tip of the catheter is likely in the proximal descending thoracic aorta. If the catheter is venous in location, then is likely within a branch vein such as the  internal mammary vein. These results were called by telephone at the time of interpretation on 12/13/2015 at 12:40 pm to the patient's nurse, Verneda Skill, who verbally acknowledged these results and will relay the message to Dr. Mikey Bussing. 2. The tip of the endotracheal tube is 5 cm above the carina. 3. The tip of the nasogastric tube lies below the field of view and is presumably within the stomach. 4. Similar appearance of the lungs with diffuse bilateral interstitial and airspace opacities. Electronically Signed   By: Malachy Moan M.D.   On: 12/13/2015 12:42   EVENTS: 3/26 - cardiac arrest 3/27 high dose versed for seizures 3/27 vstomy  STUDIES: TTE (10/29/15):  EF 30-35% w/ diffuse hypokinesis. No AS or AR. Mild MR but no MS. RV moderately dilated w/ normal systolic function. LA severely dilated & RA mildly dilated. CT HEAD 2:19AM 3/26:  R Thalamic hemorrhage as well as IVH w/ mild mass effect & 2mm right to left midline shift. Possible mild diffuse cerebral edema. Echo 3/27 EF 40%  MICROBIOLOGY: Blood Ctx x2 3/26>>> Urine Ctx 3/26>>> Tracheal Asp Ctx 3/26>>> Influenza PCR 3/26>>> Respiratory Viral Panel PCR 3/26>>> Urine Strep Ag 3/26>>> Urine Legionella Ag 3/26>>> MRSA PCR 3/26:  Negative   ANTIBIOTICS: Vancomycin 3/26>>> Zosyn 3/26>>>  LINES/TUBES: OETT 7.5 3/26>>> OGT 3/26>>> FOLEY 3/26>>> 3/27 vstomy  >>  ASSESSMENT / PLAN:  NEUROLOGIC A:   Right sided ICH - Sub-falcine herniation. Intraventricular Hemorrhage Status epilepticus UDS neg P:   Neurology following RASS goal: 0 versed gtt per cEEG - should be tapered per neuro Keppra & valproateIV SBP goal 120-140   PULMONARY A: Acute Hypoxic/ hypercarbic  Respiratory Failure - Secondary to pulmonary edema. Pulmonary Edema - Cardiogenic vs Neurogenic  P:   Full Vent Support    CARDIOVASCULAR A:  Possible Acute Systolic CHF Exacerbation - EF 30-35% 10/2015. Hypertensive Emergency  P:  Diuresis w/ lasix as BP tolerates Cardene gtt to maintain SBP 120-140   RENAL A:   Acute on Chronic Renal Failure - likely hypervolemic/cardiorenal syndrome. Lactic Acidosis - Resolving. Hyponatremia on admit, now on 3% saline  P:   Monitoring UOP with Foley Trending electrolytes & renal function daily Replace electrolytes as indicated  GASTROINTESTINAL A:   No acute issues.  P: NPO Protonix IV daily Start TFs  HEMATOLOGIC A:   Leukocytosis - Sepsis vs Stress Induced.  P:  Trending cell counts daily w/ CBC SCDs   INFECTIOUS A:   Possible CAP Possible UTI  P:   Empiric Vancomycin & Zosyn  Procalcitonin per algorithm Awaiting culture results -can dc vanc soon  Checking Urine Legionella & Streptococcal Antigens  ENDOCRINE A:   Hyperglycemia - No h/o DM.  P:   Accu-Checks q4hr SSI per algorithm  FAMILY UPDATES:  Brother updated at bedside 3/28  TODAY'S SUMMARY:  89M with ICH likely hypertensive bleed, PEA arrest from hypoxemia and acute  pulmonary edema.  Now with myoclonic status in burst suppression -versed being tapered. On hypertonic saline & s/pp vstomy for hydrocephalus   The patient is critically ill with multiple organ systems failure and requires high complexity decision making for assessment and support, frequent evaluation and titration of therapies, application of advanced monitoring  technologies and extensive interpretation of multiple databases. Critical Care Time devoted to patient care services described in this note independent of APP time is 35 minutes.   Cyril Mourning MD. Tonny Bollman. [facility] Pager [phone] If no response call [phone]  12/14/2015, 9:25 AM

## 2015-12-14 NOTE — Progress Notes (Signed)
LTM day 1 started. Dr Thedore MinsSingh Notified.

## 2015-12-14 NOTE — Progress Notes (Signed)
Initial Nutrition Assessment  DOCUMENTATION CODES:   Not applicable  INTERVENTION:  -Continue VHP @ 5140mL/hr increase by 10 every 4 hours to goal rate of 4665mL/hr -Tube-feeding regimen provides 1600 calories, 140gm protein, 1337cc free water   NUTRITION DIAGNOSIS:   Inadequate oral intake related to inability to eat as evidenced by NPO status.  GOAL:   Patient will meet greater than or equal to 90% of their needs  MONITOR:   PO intake, Labs, I & O's, Skin, TF tolerance  REASON FOR ASSESSMENT:   Ventilator, Consult Enteral/tube feeding initiation and management  ASSESSMENT:   50 yo male with h/o HTN, HFrEF, CKD, DCM 2/2 HTN who presents from home with loss of consciousness. Per report from friends and brother, he was in his usual state of health without any complaints. He was with his friends today and became acutely encephalopathic, confused and dysarthric, he slurred his speech then fell back in his chair. He arrived in the ED with sats in the 60's, emergently intubated then PEA arrest x5815min then ROSC. He was found to have a large right sided IPH with intraventricular hemorrhage likely 2/2 HTN.  Patient is currently intubated on ventilator support MV: 13.5 L/min Temp (24hrs), Avg:100.1 F (37.8 C), Min:98.4 F (36.9 C), Max:101.5 F (38.6 C)  Propofol: None  Pt intubated, sedated. Attempted to speak with wife at bedside but she speaks very little english. She did iterate pt was regularly seeing physician and blood work came back ok each time. States he was eating well PTA, but she is unable to explain much more than that.  Nutrition-Focused physical exam completed. Findings are no fat depletion, no muscle depletion, and no edema.   Labs: Na 157, Phos 5.7, K 3.6, Mg 1,8 Medications: Cardene drip; Versed Drip,  Diet Order:  Diet NPO time specified  Skin:  Reviewed, no issues  Last BM:  PTA  Height:   Ht Readings from Last 1 Encounters:  02/25/16 6\' 1"   (1.854 m)    Weight:   Wt Readings from Last 1 Encounters:  12/14/15 250 lb 10.6 oz (113.7 kg)    Ideal Body Weight:  83.63 kg  BMI:  Body mass index is 33.08 kg/(m^2).  Estimated Nutritional Needs:   Kcal:  1250-1600  Protein:  136-170gm  Fluid:  >/= 1.25L  EDUCATION NEEDS:   No education needs identified at this time  Dionne AnoWilliam M. Jamarious Febo, MS, RD LDN After Hours/Weekend Pager [phone]

## 2015-12-14 NOTE — Progress Notes (Signed)
This encounter was created in error - please disregard.

## 2015-12-14 NOTE — Progress Notes (Signed)
Patient ID: Austin Mccarty, male   DOB: 07/05/1966, 50 y.o.   MRN: [id number] BP 136/57 mmHg  Pulse 113  Temp(Src) 98.5 F (36.9 C) (Axillary)  Resp 24  Ht 6\' 1"  (1.854 m)  Wt 113.7 kg (250 lb 10.6 oz)  BMI 33.08 kg/m2  SpO2 95% Comatose. Ventricular catheter is draining well. Will leave at 10 cm H2O

## 2015-12-14 NOTE — Progress Notes (Signed)
STROKE TEAM PROGRESS NOTE   SUBJECTIVE (INTERVAL HISTORY) His brother was at the bedside. NSG Dr. Franky Machoabbell performed EVD placement yesterday. Maximized keepra and gave depakote load for seizure control. However, depakote level still low this am. Will need further load and increase the maintenance dose. Still heavy sedated with versed, will need to taper off tonight with overnight EEG monitoring.     OBJECTIVE Temp:  [97.9 F (36.6 C)-101.3 F (38.5 C)] 100.6 F (38.1 C) (03/28 1100) Pulse Rate:  [93-121] 114 (03/28 1122) Cardiac Rhythm:  [-] Sinus tachycardia (03/28 0800) Resp:  [0-37] 23 (03/28 1122) BP: (107-181)/(61-93) 154/64 mmHg (03/28 1122) SpO2:  [92 %-100 %] 97 % (03/28 1122) Arterial Line BP: (112-204)/(50-80) 153/63 mmHg (03/28 1100) FiO2 (%):  [40 %] 40 % (03/28 1122) Weight:  [250 lb 10.6 oz (113.7 kg)] 250 lb 10.6 oz (113.7 kg) (03/28 0424)  CBC:   Recent Labs Lab 01-10-16 0102  12/13/15 0420 12/14/15 0412  WBC 17.0*  < > 21.2* 14.1*  NEUTROABS 9.1*  --  19.2*  --   HGB 13.0  < > 12.1* 11.1*  HCT 40.1  < > 38.0* 35.4*  MCV 69.7*  < > 67.9* 69.5*  PLT 289  < > 232 208  < > = values in this interval not displayed.  Basic Metabolic Panel:  Recent Labs Lab 01-10-16 0438  12/13/15 0420  12/14/15 0412 12/14/15 1030  NA 134*  < > 138  138  < > 154* 157*  K 3.9  --  4.2  --  3.6  --   CL 98*  --  102  --  120*  --   CO2 25  --  23  --  25  --   GLUCOSE 262*  --  126*  --  142*  --   BUN 31*  --  34*  --  32*  --   CREATININE 1.78*  --  2.41*  --  2.26*  --   CALCIUM 8.2*  --  8.2*  --  9.0  --   MG 1.9  --  1.8  --   --   --   PHOS 6.4*  --  5.7*  --   --   --   < > = values in this interval not displayed.  Lipid Panel:     Component Value Date/Time   CHOL 109 10/29/2015 0420   TRIG 53 12/14/2015 0412   HDL 34* 10/29/2015 0420   CHOLHDL 3.2 10/29/2015 0420   VLDL 12 10/29/2015 0420   LDLCALC 63 10/29/2015 0420   HgbA1c:  Lab Results  Component  Value Date   HGBA1C 5.9* 2016-06-08   Urine Drug Screen:     Component Value Date/Time   LABOPIA NONE DETECTED 2016-06-08 0959   COCAINSCRNUR NONE DETECTED 2016-06-08 0959   LABBENZ NONE DETECTED 2016-06-08 0959   AMPHETMU NONE DETECTED 2016-06-08 0959   THCU NONE DETECTED 2016-06-08 0959   LABBARB NONE DETECTED 2016-06-08 0959      IMAGING I have personally reviewed the radiological images below and agree with the radiology interpretations.  Ct Head Wo Contrast 2015-10-03  Right thalamic hemorrhage as well as intraventricular hemorrhages with mild mass effect and a 2 mm right-to-left midline shift. Diffuse effacement of the sulci with possible mild diffuse cerebral edema.   12/13/2015  IMPRESSION: 1. Stable size of right basal ganglia hemorrhage. 2. Slight increase in midline shift, now measuring 7 mm. 3. Increased prominence of the temporal horns  of the lateral ventricles compatible with developing hydrocephalus.   Dg Chest Portable 1 View 07-Jan-2016   Appliances appear to be in satisfactory location. Diffuse airspace disease throughout both lungs.   EEG 2016/01/07 Abnormal EEG in the comatose state demonstrating: 1. Burst suppression pattern (1-10 seconds of burst activity with up to 10-30 seconds of suppression). 2. During the burst activity, there are frequent right frontal-temporal poly-spike and spike-and-wave discharges. These are associated with myoclonic jerks on video recording.  LTM EEG 12/13/15 This EEG is consistent with diffuse non-specific cerebral dysfunction, with epileptiform discharges in the right hemisphere that improve though the duration of the recording. No electrographic seizures were seen.  TTE - - Left ventricle: The cavity size was moderately dilated. Wall  thickness was normal. The estimated ejection fraction was 40%.  Diffuse hypokinesis.   PHYSICAL EXAMONATION  Temp:  [97.9 F (36.6 C)-101.3 F (38.5 C)] 100.6 F (38.1 C) (03/28 1100) Pulse  Rate:  [93-121] 114 (03/28 1122) Resp:  [0-37] 23 (03/28 1122) BP: (107-181)/(61-93) 154/64 mmHg (03/28 1122) SpO2:  [92 %-100 %] 97 % (03/28 1122) Arterial Line BP: (112-204)/(50-80) 153/63 mmHg (03/28 1100) FiO2 (%):  [40 %] 40 % (03/28 1122) Weight:  [250 lb 10.6 oz (113.7 kg)] 250 lb 10.6 oz (113.7 kg) (03/28 0424)  General - Well nourished, well developed, intubated and sedated.  Ophthalmologic - Fundi not visualized due to ET positioning.  Cardiovascular - Regular rhythm, but tachycardia.  Neuro - intubated and sedated, not respond to voice. PERRL, sluggish, but no dolls eyes or corneal, with weak gag. Right UE trace withdraw on pain, no movement on other limbs on pain stimulation. DTR diminished and no babinski. Sensation, coordination and gait not tested.   ASSESSMENT/PLAN Mr. Gaberial Cada is a 50 y.o. male with history of chronic kidney disease, community-acquired pneumonia, and hypertension, congestive heart failure, presenting with confusion, dysarthria, and subsequent loss of consciousness. Patient required intubation and went into PEA arrest for 15 min with resuscitation. Found to have a right BG ICH with IVH.  He did not receive IV t-PA due to hemorrhage.  Stroke:  right BG intracerebral hemorrhage with ventricular extension.  CT showed right BG ICH with IVH  Repeat CT head showed stable hematoma but obstructive hydrocephalus  2D Echo EF 40%, improved from prior.  LDL 60 on 10/29/2015  HgbA1c 5.9  VTE prophylaxis - SCDs Diet NPO time specified  No antithrombotic prior to admission, now on No antithrombotic secondary to hemorrhage.  Ongoing aggressive stroke risk factor management  Disposition - pending  Obstructive hydrocephalus  CT repeat showed above findings  EVD placed by NSG  Current at level 10 cm H2O  Repeat CT in am  Seizure  Initial stat EEG burst suppression with myoclonic status  Put on versed drip high dose - continue for  now  Put on keppra and depakote  LTM EEG 12/13/15 showed no seizure but slowing  Maximize keppra to  bid  depakote load again today and increase to  Q8  depakote level 33->48  Overnight LTM EEG with versed weaning off  Cerebral edema  CT repeat showed increase midline shift  D/c mannitol  on 3% saline after central line  Na Q6h 149->154->157  Na goal 150-155  Cardiac arrest  Likely related to ICH  Resuscitated  Burst suppression on EEG but resolved on versed  NSR now  respiratory failure and aspiration pneumonia  Intubated   CCM on board  On vanco and zosyn  Leukocytosis 17.0->21.2->14.1  Hypertension  Managed with Cardene   Developed with hypotension and on Neo, now off  BP goal < 160  Resumed home meds coreg, hydralazine, imdur, and cozaar  Other Stroke Risk Factors  Cigarette smoker, quit smoking 8 years ago.  Obesity, Body mass index is 33.08 kg/(m^2).   Other Active Problems  Cardiomyopathy EF 40%   Chronic kidney disease, Cre 2.41->2.26  Hospital day # 2  This patient is critically ill due to right ICH and IVH, hydrocephalus, seizure, cardiac arrest, cerebral edema and at significant risk of neurological worsening, death form recurrent bleeding, brain herniation, heart failure, cardiac arrest, hydrocephalus. This patient's care requires constant monitoring of vital signs, hemodynamics, respiratory and cardiac monitoring, review of multiple databases, neurological assessment, discussion with family, other specialists and medical decision making of high complexity. I spent 40 minutes of neurocritical care time in the care of this patient.  Marvel Plan, MD PhD Stroke Neurology 12/14/2015 12:22 PM    To contact Stroke Continuity provider, please refer to [email or web address]. After hours, contact General Neurology

## 2015-12-14 NOTE — Progress Notes (Signed)
Dr. Roda ShuttersXu notified of rythmic movement in the face. Order to increase Versed to 30. Will continue to monitor closely.

## 2015-12-15 ENCOUNTER — Ambulatory Visit (HOSPITAL_COMMUNITY): Payer: Medicaid Other

## 2015-12-15 ENCOUNTER — Inpatient Hospital Stay (HOSPITAL_COMMUNITY): Payer: Medicaid Other

## 2015-12-15 ENCOUNTER — Ambulatory Visit: Payer: No Typology Code available for payment source | Admitting: Family Medicine

## 2015-12-15 DIAGNOSIS — J9601 Acute respiratory failure with hypoxia: Secondary | ICD-10-CM

## 2015-12-15 DIAGNOSIS — I10 Essential (primary) hypertension: Secondary | ICD-10-CM

## 2015-12-15 DIAGNOSIS — J9620 Acute and chronic respiratory failure, unspecified whether with hypoxia or hypercapnia: Secondary | ICD-10-CM

## 2015-12-15 DIAGNOSIS — R509 Fever, unspecified: Secondary | ICD-10-CM

## 2015-12-15 LAB — GLUCOSE, CAPILLARY
GLUCOSE-CAPILLARY: 85 mg/dL (ref 65–99)
GLUCOSE-CAPILLARY: 108 mg/dL — AB (ref 65–99)
Glucose-Capillary: 127 mg/dL — ABNORMAL HIGH (ref 65–99)
Glucose-Capillary: 112 mg/dL — ABNORMAL HIGH (ref 65–99)
Glucose-Capillary: 126 mg/dL — ABNORMAL HIGH (ref 65–99)
Glucose-Capillary: 111 mg/dL — ABNORMAL HIGH (ref 65–99)

## 2015-12-15 LAB — CBC
HEMATOCRIT: 39.1 % (ref 39.0–52.0)
HEMOGLOBIN: 12 g/dL — AB (ref 13.0–17.0)
MCH: 22.3 pg — ABNORMAL LOW (ref 26.0–34.0)
MCHC: 30.7 g/dL (ref 30.0–36.0)
MCV: 72.5 fL — ABNORMAL LOW (ref 78.0–100.0)
Platelets: 224 10*3/uL (ref 150–400)
RBC: 5.39 MIL/uL (ref 4.22–5.81)
RDW: 19.6 % — ABNORMAL HIGH (ref 11.5–15.5)
WBC: 11.9 10*3/uL — ABNORMAL HIGH (ref 4.0–10.5)

## 2015-12-15 LAB — SODIUM
SODIUM: 160 mmol/L — AB (ref 135–145)
SODIUM: 162 mmol/L — AB (ref 135–145)
Sodium: 159 mmol/L — ABNORMAL HIGH (ref 135–145)

## 2015-12-15 LAB — BASIC METABOLIC PANEL
ANION GAP: 10 (ref 5–15)
BUN: 19 mg/dL (ref 6–20)
CHLORIDE: 123 mmol/L — AB (ref 101–111)
CO2: 26 mmol/L (ref 22–32)
Calcium: 9 mg/dL (ref 8.9–10.3)
Creatinine, Ser: 1.78 mg/dL — ABNORMAL HIGH (ref 0.61–1.24)
GFR calc non Af Amer: 44 mL/min — ABNORMAL LOW (ref >60)
GFR, EST AFRICAN AMERICAN: 51 mL/min — AB (ref >60)
Glucose, Bld: 122 mg/dL — ABNORMAL HIGH (ref 65–99)
POTASSIUM: 3.9 mmol/L (ref 3.5–5.1)
SODIUM: 159 mmol/L — AB (ref 135–145)

## 2015-12-15 LAB — TRIGLYCERIDES: TRIGLYCERIDES: 71 mg/dL (ref <150)

## 2015-12-15 LAB — VANCOMYCIN, TROUGH: Vancomycin Tr: 5 ug/mL — ABNORMAL LOW (ref 10.0–20.0)

## 2015-12-15 LAB — VALPROIC ACID LEVEL: VALPROIC ACID LVL: 69 ug/mL (ref 50.0–100.0)

## 2015-12-15 MED ORDER — VALPROATE SODIUM 500 MG/5ML IV SOLN
1250.0000 mg | Freq: Three times a day (TID) | INTRAVENOUS | Status: DC
  Administered 2015-12-15 – 2015-12-22 (×20): 1250 mg via INTRAVENOUS
  Filled 2015-12-15 (×24): qty 12.5

## 2015-12-15 MED ORDER — HYDRALAZINE HCL 50 MG PO TABS
100.0000 mg | ORAL_TABLET | Freq: Three times a day (TID) | ORAL | Status: DC
  Administered 2015-12-15 – 2015-12-26 (×33): 100 mg via ORAL
  Filled 2015-12-15 (×34): qty 2

## 2015-12-15 MED ORDER — HEPARIN SODIUM (PORCINE) 5000 UNIT/ML IJ SOLN
5000.0000 [IU] | Freq: Three times a day (TID) | INTRAMUSCULAR | Status: DC
Start: 2015-12-15 — End: 2015-12-19
  Administered 2015-12-15 – 2015-12-18 (×10): 5000 [IU] via SUBCUTANEOUS
  Filled 2015-12-15 (×11): qty 1

## 2015-12-15 MED ORDER — VANCOMYCIN HCL IN DEXTROSE 1-5 GM/200ML-% IV SOLN
1000.0000 mg | Freq: Two times a day (BID) | INTRAVENOUS | Status: DC
  Administered 2015-12-15 – 2015-12-17 (×4): 1000 mg via INTRAVENOUS
  Filled 2015-12-15 (×5): qty 200

## 2015-12-15 MED FILL — Medication: Qty: 1 | Status: AC

## 2015-12-15 NOTE — Progress Notes (Signed)
Pharmacy Antibiotic Note  Austin Mccarty is a 50 y.o. male admitted on 12/17/2015 s/p PEA arrest.  Pharmacy has been consulted to manage vancomycin and Zosyn for PNA.  Today is day #4 of antibiotics.  Patient's renal function is improving and vancomycin trough is sub-therapeutic.   Plan: Continue Zosyn 3.375gm IV Q8H, 4 hr infusion Change vanc to 1gm IV Q12H Monitor clinical picture, renal function, repeat vanc trough if indicated F/U C&S, abx deescalation / LOT   Height: 6\' 1"  (185.4 cm) Weight: 247 lb 2.2 oz (112.1 kg) IBW/kg (Calculated) : 79.9  Temp (24hrs), Avg:101 F (38.3 C), Min:100.4 F (38 C), Max:101.5 F (38.6 C)   Recent Labs Lab 12/17/2015 0102 11/18/2015 0130 12/11/2015 0438 12/10/2015 0445 12/13/15 0420 12/14/15 0412 12/15/15 0430 12/15/15 1515  WBC 17.0*  --  17.0*  --  21.2* 14.1* 11.9*  --   CREATININE 1.78*  --  1.78*  --  2.41* 2.26* 1.78*  --   LATICACIDVEN  --  5.16*  --  2.23*  --   --   --   --   VANCOTROUGH  --   --   --   --   --   --   --  5*    Estimated Creatinine Clearance: 67.3 mL/min (by C-G formula based on Cr of 1.78).    Allergies  Allergen Reactions  . Ibuprofen Itching and Swelling    Antimicrobials this admission: 3/26 vanc >>  3/26 zosyn >>   Dose adjustments this admission: 3/29 VT = 5 mcg/mL on 1250mg  q24 >> 1g q12 (SCr 1.78)  Microbiology results: 3/28: BCx: ngtd 3/26 urine: ngF 3/26 resp: ngF 3/26 MRSA PCR neg 3/26 blood x2: ngtd   Ravenna Legore D. Laney Potashang, PharmD, BCPS Pager:  [phone] 12/15/2015, 4:18 PM

## 2015-12-15 NOTE — Clinical Social Work Note (Signed)
CSW spoke with unit charge to provide resources for financial assistance for family regarding rental assistance. Contact information for [place] and [employer] provided. CSW unable to meet with family today to complete full assessment, report will be left for unit CSW.   Roddie McBryant Reily Treloar MSW, [place], [place], [phone]

## 2015-12-15 NOTE — Progress Notes (Signed)
STROKE TEAM PROGRESS NOTE   SUBJECTIVE (INTERVAL HISTORY) His brother was at the bedside. EVD drainage patent. LTM EEG showed no seizure. Repeat CT showed decreased hydrocephalus. Weaning off versed now. Increase BP meds to wean off cardene. Still fever, so far infection work up negative, could be central fever.      OBJECTIVE Temp:  [100.4 F (38 C)-101.3 F (38.5 C)] 100.4 F (38 C) (03/29 1500) Pulse Rate:  [86-108] 95 (03/29 1700) Cardiac Rhythm:  [-] Sinus tachycardia (03/29 0800) Resp:  [15-27] 18 (03/29 1700) BP: (124-197)/(59-90) 144/65 mmHg (03/29 1700) SpO2:  [94 %-100 %] 96 % (03/29 1700) Arterial Line BP: (121-206)/(56-87) 197/77 mmHg (03/29 1700) FiO2 (%):  [40 %] 40 % (03/29 1536) Weight:  [247 lb 2.2 oz (112.1 kg)] 247 lb 2.2 oz (112.1 kg) (03/29 0500)  CBC:   Recent Labs Lab 2015-10-03 0102  12/13/15 0420 12/14/15 0412 12/15/15 0430  WBC 17.0*  < > 21.2* 14.1* 11.9*  NEUTROABS 9.1*  --  19.2*  --   --   HGB 13.0  < > 12.1* 11.1* 12.0*  HCT 40.1  < > 38.0* 35.4* 39.1  MCV 69.7*  < > 67.9* 69.5* 72.5*  PLT 289  < > 232 208 224  < > = values in this interval not displayed.  Basic Metabolic Panel:  Recent Labs Lab 2015-10-03 0438  12/13/15 0420  12/14/15 0412  12/15/15 0430 12/15/15 1030  NA 134*  < > 138  138  < > 154*  < > 159* 160*  K 3.9  --  4.2  --  3.6  --  3.9  --   CL 98*  --  102  --  120*  --  123*  --   CO2 25  --  23  --  25  --  26  --   GLUCOSE 262*  --  126*  --  142*  --  122*  --   BUN 31*  --  34*  --  32*  --  19  --   CREATININE 1.78*  --  2.41*  --  2.26*  --  1.78*  --   CALCIUM 8.2*  --  8.2*  --  9.0  --  9.0  --   MG 1.9  --  1.8  --   --   --   --   --   PHOS 6.4*  --  5.7*  --   --   --   --   --   < > = values in this interval not displayed.  Lipid Panel:     Component Value Date/Time   CHOL 109 10/29/2015 0420   TRIG 71 12/15/2015 0500   HDL 34* 10/29/2015 0420   CHOLHDL 3.2 10/29/2015 0420   VLDL 12 10/29/2015 0420    LDLCALC 63 10/29/2015 0420   HgbA1c:  Lab Results  Component Value Date   HGBA1C 5.9* 10/27/2015   Urine Drug Screen:     Component Value Date/Time   LABOPIA NONE DETECTED 10/27/2015 0959   COCAINSCRNUR NONE DETECTED 10/27/2015 0959   LABBENZ NONE DETECTED 10/27/2015 0959   AMPHETMU NONE DETECTED 10/27/2015 0959   THCU NONE DETECTED 10/27/2015 0959   LABBARB NONE DETECTED 10/27/2015 0959      IMAGING I have personally reviewed the radiological images below and agree with the radiology interpretations.  Ct Head Wo Contrast 12/24/15  Right thalamic hemorrhage as well as intraventricular hemorrhages with mild mass effect and  a 2 mm right-to-left midline shift. Diffuse effacement of the sulci with possible mild diffuse cerebral edema.   12/13/2015  IMPRESSION: 1. Stable size of right basal ganglia hemorrhage. 2. Slight increase in midline shift, now measuring 7 mm. 3. Increased prominence of the temporal horns of the lateral ventricles compatible with developing hydrocephalus.   12/15/2015  IMPRESSION: 1. Unchanged size of right thalamic hemorrhage. Decreased size of the ventricles following interval ventriculostomy catheter placement. Slightly decreased volume of intraventricular hemorrhage. 2. Decreased gray-white differentiation and diffuse sulcal effacement concerning for diffuse edema.   Dg Chest Portable 1 View 11/26/2015   Appliances appear to be in satisfactory location. Diffuse airspace disease throughout both lungs.   12/15/2015  IMPRESSION: 1. Left IJ line tip noted in unchanged position to the left of midline. Reference is made to prior chest x-ray report of 12/13/2015. Again intra-arterial location of the left IJ line cannot be excluded. This finding was reported verbally to the patient's caregiver on 12/13/2015. Endotracheal tube and NG tube in stable position. 2. Cardiomegaly with persistent bilateral pulmonary infiltrates/pulmonary edema. Similar findings noted on prior  exam.   EEG 11/28/2015 Abnormal EEG in the comatose state demonstrating: 1. Burst suppression pattern (1-10 seconds of burst activity with up to 10-30 seconds of suppression). 2. During the burst activity, there are frequent right frontal-temporal poly-spike and spike-and-wave discharges. These are associated with myoclonic jerks on video recording.  LTM EEG  12/13/15 This EEG is consistent with diffuse non-specific cerebral dysfunction, with epileptiform discharges in the right hemisphere that improve though the duration of the recording. No electrographic seizures were seen. 12/14/15 This EEG is consistent with diffuse non-specific cerebral dysfunction, with suggestion of cortical irritability in the right hemisphere. No electrographic seizures were seen.  TTE - - Left ventricle: The cavity size was moderately dilated. Wall  thickness was normal. The estimated ejection fraction was 40%.  Diffuse hypokinesis.  Renal artery ultrasound - pending     PHYSICAL EXAMONATION  Temp:  [100.4 F (38 C)-101.3 F (38.5 C)] 100.4 F (38 C) (03/29 1500) Pulse Rate:  [86-108] 95 (03/29 1700) Resp:  [15-27] 18 (03/29 1700) BP: (124-197)/(59-90) 144/65 mmHg (03/29 1700) SpO2:  [94 %-100 %] 96 % (03/29 1700) Arterial Line BP: (121-206)/(56-87) 197/77 mmHg (03/29 1700) FiO2 (%):  [40 %] 40 % (03/29 1536) Weight:  [247 lb 2.2 oz (112.1 kg)] 247 lb 2.2 oz (112.1 kg) (03/29 0500)  General - Well nourished, well developed, intubated and sedated.  Ophthalmologic - Fundi not visualized due to ET positioning.  Cardiovascular - Regular rhythm, but tachycardia.  Neuro - intubated and sedated, not respond to voice. PERRL, sluggish, but no dolls eyes or corneal, with weak gag. Right UE trace withdraw on pain, no movement on other limbs on pain stimulation. DTR diminished and no babinski. Sensation, coordination and gait not tested.   ASSESSMENT/PLAN Austin Mccarty is a 50 y.o. male with  history of chronic kidney disease, community-acquired pneumonia, and hypertension, congestive heart failure, presenting with confusion, dysarthria, and subsequent loss of consciousness. Patient required intubation and went into PEA arrest for 15 min with resuscitation. Found to have a right BG ICH with IVH.  He did not receive IV t-PA due to hemorrhage.  Stroke:  right BG intracerebral hemorrhage with ventricular extension.  CT showed right BG ICH with IVH  Repeat CT head showed stable hematoma but obstructive hydrocephalus  Repeat CT again showed stable hematoma and decreased hydrocephalus after EVD  2D Echo EF 40%,  improved from prior.  LDL 60 on 10/29/2015  HgbA1c 5.9  VTE prophylaxis - SCDs and heparin subq Diet NPO time specified  No antithrombotic prior to admission, now on No antithrombotic secondary to hemorrhage.  Ongoing aggressive stroke risk factor management  Disposition - pending  Obstructive hydrocephalus  CT repeat showed above findings  EVD placed by NSG  Repeat CT showed decreased hydrocephalus  Seizure  Initial stat EEG burst suppression with myoclonic status  Put on versed drip high dose - weaning off  Put on keppra and depakote  LTM EEG so far showed no seizure but slowing  Maximize keppra to  bid  depakote load again today and increase to  Q8  depakote level 33->48->69  Continue LTM EEG with versed weaning off  Cerebral edema  CT repeat showed increase midline shift  D/c mannitol  3% saline on hold  Na Q6h 149->154->157->160  Na goal 150-155  Cardiac arrest  Likely related to ICH  Resuscitated  Burst suppression on EEG but resolved on versed  NSR with tachycardia now  respiratory failure and aspiration pneumonia  Intubated   CCM on board  On vanco and zosyn   Leukocytosis 17.0->21.2->14.1->11.9  Hypertension  Managed with Cardene   Developed with hypotension and on Neo, now off  BP goal <  160  Resumed home meds coreg, hydralazine, imdur, and cozaar  Increased hydralazine to  tid  Fever   UA negative  BCx NGTD  Discuss with NSG about CSF sampling, but unlikely ventriculitis  CXR stable  On vanco and zosyn  Central fever not excluded  Other Stroke Risk Factors  Cigarette smoker, quit smoking 8 years ago.  Obesity, Body mass index is 32.61 kg/(m^2).   Other Active Problems  Cardiomyopathy EF 40%   Chronic kidney disease, Cre 2.41->2.26->1.78  Hospital day # 3  This patient is critically ill due to right ICH and IVH, hydrocephalus, seizure, cardiac arrest, cerebral edema and at significant risk of neurological worsening, death form recurrent bleeding, brain herniation, heart failure, cardiac arrest, hydrocephalus. This patient's care requires constant monitoring of vital signs, hemodynamics, respiratory and cardiac monitoring, review of multiple databases, neurological assessment, discussion with family, other specialists and medical decision making of high complexity. I spent 45 minutes of neurocritical care time in the care of this patient.  Marvel Plan, MD PhD Stroke Neurology 12/15/2015 6:07 PM    To contact Stroke Continuity provider, please refer to [email or web address]. After hours, contact General Neurology

## 2015-12-15 NOTE — Progress Notes (Signed)
Patient ETT advanced 2cm per MD order.

## 2015-12-15 NOTE — Procedures (Addendum)
Electroencephalogram report- LTM  Ordering Physician : Dr. Roda ShuttersXu EEG number: [phone]    Beginning date or time: 12/16/2015 7:30AM Ending date or time: 12/17/2015 7:30AM  Day of study: day 4  Medications include: Per EMR  REPORT: The background activity in this tracing consisted of low voltage polymorphic delta and theta background, with no clear posterior dominant rhythm. EKG artifact was seen throughout the recording. No clear epileptiform activity was seen.  IMPRESSION: This is an abnormal EEG due to: 1) Diffuse slowing - low voltage diffuse delta  CLINICAL CORRELATION: This EEG is consistent with diffuse non-specific cerebral dysfunction. No electrographic seizures were seen. No significant change from previous day's EEG.         --------------------------------------------------------------------------------------------------------------------------------- Beginning date or time: 12/15/2015 7:30AM Ending date or time: 12/16/2015 7:30AM  Day of study: day 2  Medications include: Per EMR  REPORT: The background activity in this tracing consisted of low voltage polymorphic delta and theta background, with no clear posterior dominant rhythm. EKG artifact was seen throughout the recording. No clear epileptiform activity was seen.  IMPRESSION: This is an abnormal EEG due to: 1) Diffuse slowing - low voltage diffuse delta  CLINICAL CORRELATION: This EEG is consistent with diffuse non-specific cerebral dysfunction. No electrographic seizures were seen.   ------------------------------------------------------------------------------------------------------------------------------------ Beginning date or time: 12/14/2015 2:19PM Ending date or time: 12/15/2015 7:30AM  Day of study: day 1  Medications include: Per EMR   HISTORY: This 24 hours of intensive EEG monitoring with simultaneous video monitoring was performed for this patient with intracranial hemorrhage and altered mental  status. This EEG was requested to rule out subclinical electrographic seizures.  TECHNICAL DESCRIPTION:  The study consists of a continuous 16-channel multi-montage digital video EEG recording with twenty-one electrodes placed according to the International 10-20 System. Additional leads included eye leads, true temporal leads (T1, T2), and an EKG lead. Activation procedures were not done due to mental status.  REPORT: The background activity in this tracing consisted of low voltage polymorphic delta and theta background, with no clear posterior dominant rhythm. Occasionally, low voltage sharp waves were seen in the right frontal region. No clear electrographic seizures were seen.  IMPRESSION: This is an abnormal EEG due to: 1) Possible right frontal discharges   2) Diffuse slowing.  CLINICAL CORRELATION: This EEG is consistent with diffuse non-specific cerebral dysfunction, with suggestion of cortical irritability in the right hemisphere. No electrographic seizures were seen.

## 2015-12-15 NOTE — Progress Notes (Signed)
vLTM EEG running after Pt was disconnecting for CT. No skin breakdown with rehook

## 2015-12-15 NOTE — Progress Notes (Signed)
Patient ID: Austin Mccarty, male   DOB: 01/10/1966, 50 y.o.   MRN: [id number] BP 111/50 mmHg  Pulse 84  Temp(Src) 100.4 F (38 C) (Core (Comment))  Resp 20  Ht 6\' 1"  (1.854 m)  Wt 112.1 kg (247 lb 2.2 oz)  BMI 32.61 kg/m2  SpO2 95% Comatose, sedated Drain is working well, will raise pressure, ventricles are collapsed.

## 2015-12-15 NOTE — Progress Notes (Signed)
PULMONARY / CRITICAL CARE MEDICINE   Name: Austin Mccarty MRN: [id number] DOB: 03/28/1966    ADMISSION DATE:  11/20/2015  CHIEF COMPLAINT:  PEA arrest  HISTORY OF PRESENT ILLNESS:   50 yo male with h/o HTN, HFrEF, CKD, DCM 2/2 HTN who presented 3/26 from home with loss of consciousness.  Marland Kitchen.  He arrived in the ED with sats in the 60's, emergently intubated then PEA arrest x9015min then ROSC.  He was found to have a large right sided ICH with intraventricular hemorrhage likely 2/2 HTN.  SUBJECTIVE: remains intubated, critically ill  on Cardene infusion  & versed gtt for seizures   VITAL SIGNS: Temp:  [98.5 F (36.9 C)-101.5 F (38.6 C)] 100.6 F (38.1 C) (03/29 0900) Pulse Rate:  [90-122] 90 (03/29 0900) Resp:  [15-27] 25 (03/29 0900) BP: (113-197)/(55-90) 128/61 mmHg (03/29 0900) SpO2:  [94 %-100 %] 97 % (03/29 0900) Arterial Line BP: (91-206)/(42-87) 166/62 mmHg (03/29 0900) FiO2 (%):  [40 %] 40 % (03/29 0800) Weight:  [112.1 kg (247 lb 2.2 oz)] 112.1 kg (247 lb 2.2 oz) (03/29 0500) HEMODYNAMICS:   VENTILATOR SETTINGS: Vent Mode:  [-] PRVC FiO2 (%):  [40 %] 40 % Set Rate:  [20 bmp] 20 bmp Vt Set:  [550 mL] 550 mL PEEP:  [5 cmH20] 5 cmH20 Plateau Pressure:  [21 cmH20-24 cmH20] 24 cmH20 INTAKE / OUTPUT:  Intake/Output Summary (Last 24 hours) at 12/15/15 0910 Last data filed at 12/15/15 0700  Gross per 24 hour  Intake 4344.92 ml  Output   5364 ml  Net -1019.08 ml   PHYSICAL EXAMINATION: General:  Sedated. Ventilated, acutely ill Brother at bedside.  Integument:  Warm & dry. No rash on exposed skin.  HEENT:  No scleral injection or icterus. Endotracheal tube in place.  Cardiovascular:  Regular rate. Normal S1 & S2. No appreciable JVD.  Pulmonary:  Distant breath sounds bilaterally. Symmetric chest wall rise on ventilator. Abdomen: Soft. Normal bowel sounds. Nondistended.  Neurological:  Sedated. Pupils equal. No withdrawal to pain in  extremities.  LABS:  CBC  Recent Labs Lab 12/13/15 0420 12/14/15 0412 12/15/15 0430  WBC 21.2* 14.1* 11.9*  HGB 12.1* 11.1* 12.0*  HCT 38.0* 35.4* 39.1  PLT 232 208 224   Coag's  Recent Labs Lab 12/01/2015 0438  APTT 32  INR 1.14   BMET  Recent Labs Lab 12/13/15 0420  12/14/15 0412  12/14/15 1745 12/14/15 2245 12/15/15 0430  NA 138  138  < > 154*  < > 160* 162* 159*  K 4.2  --  3.6  --   --   --  3.9  CL 102  --  120*  --   --   --  123*  CO2 23  --  25  --   --   --  26  BUN 34*  --  32*  --   --   --  19  CREATININE 2.41*  --  2.26*  --   --   --  1.78*  GLUCOSE 126*  --  142*  --   --   --  122*  < > = values in this interval not displayed. Electrolytes  Recent Labs Lab 11/20/2015 0438 12/13/15 0420 12/14/15 0412 12/15/15 0430  CALCIUM 8.2* 8.2* 9.0 9.0  MG 1.9 1.8  --   --   PHOS 6.4* 5.7*  --   --    Sepsis Markers  Recent Labs Lab 11/26/2015 0130 11/17/2015 0445 12/11/2015 0909 12/13/15  0420 12/14/15 0412  LATICACIDVEN 5.16* 2.23*  --   --   --   PROCALCITON  --   --  6.55 13.69 5.72   ABG  Recent Labs Lab 2016/01/08 0135 January 08, 2016 0519 12/14/15 1040  PHART 7.009* 7.228* 7.356  PCO2ART 93.7* 64.0* 47.9*  PO2ART 67.0* 76.0* 84.3   Liver Enzymes  Recent Labs Lab 2016-01-08 0102 12/13/15 0420  AST 41  --   ALT 47  --   ALKPHOS 48  --   BILITOT 0.5  --   ALBUMIN 2.5* 2.7*   Cardiac Enzymes No results for input(s): TROPONINI, PROBNP in the last 168 hours. Glucose  Recent Labs Lab 12/14/15 1140 12/14/15 1554 12/14/15 1930 12/14/15 2321 12/15/15 0317 12/15/15 0729  GLUCAP 106* 103* 89 101* 85 127*   Imaging Dg Chest Port 1 View  12/15/2015  CLINICAL DATA:  Respiratory failure. EXAM: PORTABLE CHEST 1 VIEW COMPARISON:  12/13/2015. FINDINGS: Endotracheal tube and NG tube in stable position . Left IJ line tip in unchanged position to the left of midline, reference is made to prior chest x-ray report 12/13/2015. Again intra-arterial  placement cannot be excluded. Stable cardiomegaly. Persistent bilateral pulmonary infiltrates and or edema noted. No pneumothorax. IMPRESSION: 1. Left IJ line tip noted in unchanged position to the left of midline. Reference is made to prior chest x-ray report of 12/13/2015. Again intra-arterial location of the left IJ line cannot be excluded. This finding was reported verbally to the patient's caregiver on 12/13/2015. Endotracheal tube and NG tube in stable position. 2. Cardiomegaly with persistent bilateral pulmonary infiltrates/pulmonary edema. Similar findings noted on prior exam. Electronically Signed   By: Maisie Fus  Register   On: 12/15/2015 07:51   EVENTS: 3/26 - cardiac arrest 3/27 - high dose versed for seizures 3/27 - ventriculostomy  STUDIES: TTE (10/29/15):  EF 30-35% w/ diffuse hypokinesis. No AS or AR. Mild MR but no MS. RV moderately dilated w/ normal systolic function. LA severely dilated & RA mildly dilated. CT HEAD 2:19AM 3/26:  R Thalamic hemorrhage as well as IVH w/ mild mass effect & 2mm right to left midline shift. Possible mild diffuse cerebral edema. Echo 3/27 EF 40%  MICROBIOLOGY: Blood Ctx x2 3/26>>> Urine Ctx 3/26>>> Tracheal Asp Ctx 3/26>>> Influenza PCR 3/26>>> Respiratory Viral Panel PCR 3/26>>> Urine Strep Ag 3/26>>> Urine Legionella Ag 3/26>>> MRSA PCR 3/26:  Negative   ANTIBIOTICS: Vancomycin 3/26>>> Zosyn 3/26>>>  LINES/TUBES: OETT 7.5 3/26>>> OGT 3/26>>> FOLEY 3/26>>> 3/27 vstomy >>  ASSESSMENT / PLAN:  NEUROLOGIC A:   Right sided ICH - Sub-falcine herniation. Intraventricular Hemorrhage Status epilepticus UDS neg P:   Neurology following. RASS goal: 0. Versed gtt per cEEG - should be tapered per neuro, goal to decrease to 10 mg/hr. Keppra & valproate IV. SBP goal 120-140.  PULMONARY A: Acute Hypoxic/ hypercarbic  Respiratory Failure - Secondary to pulmonary edema. Pulmonary Edema - Cardiogenic vs Neurogenic  P:   Full Vent Support  while on sedation. Titrate O2 for sat of 88-92%. Adjust vent for ABG. ABG and CXR in AM. Move ETT down by 2.  CARDIOVASCULAR A:  Possible Acute Systolic CHF Exacerbation - EF 30-35% 10/2015. Hypertensive Emergency  P:  Cardene gtt to maintain SBP 120-140, now off.  RENAL A:   Acute on Chronic Renal Failure - likely hypervolemic/cardiorenal syndrome. Lactic Acidosis - Resolving. Hyponatremia on admit, now on 3% saline  P:   Monitoring UOP with Foley Trending electrolytes & renal function daily Replace electrolytes as indicated Hold 3%  for now.  GASTROINTESTINAL A:   No acute issues.  P: Protonix IV daily Start TFs  HEMATOLOGIC A:   Leukocytosis - Sepsis vs Stress Induced.  P:  Trending cell counts daily w/ CBC SCDs  INFECTIOUS A:   Possible CAP Possible UTI Fever overnight without evidence of infection.  P:   Empiric Vancomycin & Zosyn  Procalcitonin per algorithm  Checking Urine Legionella & Streptococcal Antigens  ENDOCRINE A:   Hyperglycemia - No h/o DM.  P:   Accu-Checks q4hr SSI per algorithm  FAMILY UPDATES:  Brother updated at bedside 3/29  The patient is critically ill with multiple organ systems failure and requires high complexity decision making for assessment and support, frequent evaluation and titration of therapies, application of advanced monitoring technologies and extensive interpretation of multiple databases.   Critical Care Time devoted to patient care services described in this note is  35  Minutes. This time reflects time of care of this signee Dr Koren Bound. This critical care time does not reflect procedure time, or teaching time or supervisory time of PA/NP/Med student/Med Resident etc but could involve care discussion time.  Alyson Reedy, M.D. [facility] Pulmonary/Critical Care Medicine. Pager: [phone]. After hours pager: [phone].  12/15/2015, 9:10 AM

## 2015-12-15 NOTE — Progress Notes (Deleted)
EEG completed; results pending.    

## 2015-12-15 NOTE — Progress Notes (Signed)
Patient transported to CT and returned to [id number]. Vital signs stable throughout. No complications. Patient tolerated well. RT will continue to monitor.

## 2015-12-15 NOTE — Progress Notes (Signed)
VASCULAR LAB PRELIMINARY  PRELIMINARY  PRELIMINARY  PRELIMINARY  Renal artery duplex completed.    Preliminary report:  No evidence of renal artery stenosis bilaterally.  Intra renal resistive indices are slightly elevated.  Amaya Blakeman, RVT 12/15/2015, 11:05 AM

## 2015-12-15 NOTE — Progress Notes (Signed)
CRITICAL VALUE ALERT  Critical value received: Na 162  Date of notification:  12/14/2015  Time of notification:  2355  Critical value read back:Yes  Nurse who received alert:  Ripley FraiseHarper, Pegi Milazzo D  MD notified (1st page):  Dr. Amada JupiterKirkpatrick  Time of first page:  2355  MD notified (2nd page):  Time of second page:  Responding MD:  Dr. Amada JupiterKirkpatrick  Time MD responded:  2355

## 2015-12-15 NOTE — Progress Notes (Signed)
Pharmacy Antibiotic Note Austin Mccarty is a 50 y.o. male admitted on 12/16/2015 and s/p PEA arrest and also noted with right sided IPH with intraventricular hemorrhage. On day 4 zosyn and vancomycin for empiric coverage of PNA. Current dose of vancomycin is 1250 mg Q 24H.   Plan: 1. If continued will obtain trough before 15:00 dose of vancomycin today to evaluate current dosing regimen; goal trough 15 - 20  2. Zosyn 3.375gm IV q8h 3. ? Narrow abx   Height: 6\' 1"  (185.4 cm) Weight: 247 lb 2.2 oz (112.1 kg) IBW/kg (Calculated) : 79.9  Temp (24hrs), Avg:101 F (38.3 C), Min:98.5 F (36.9 C), Max:101.5 F (38.6 C)   Recent Labs Lab 11/26/2015 0102 12/07/2015 0130 12/06/2015 0438 11/27/2015 0445 12/13/15 0420 12/14/15 0412 12/15/15 0430  WBC 17.0*  --  17.0*  --  21.2* 14.1* 11.9*  CREATININE 1.78*  --  1.78*  --  2.41* 2.26* 1.78*  LATICACIDVEN  --  5.16*  --  2.23*  --   --   --     Estimated Creatinine Clearance: 67.3 mL/min (by C-G formula based on Cr of 1.78).    Allergies  Allergen Reactions  . Ibuprofen Itching and Swelling    Antimicrobials this admission: 3/26 vanc 3/26 zosyn  Microbiology results: 3/26 urine: ngF 3/26 resp: ngtd 3/26 MRSA PCR- neg 3/26 BCx: ngtd 3/28 BCx: px  Thank you for allowing pharmacy to be a part of this patient's care.  Austin Mccarty, PharmD, BCPS 12/15/2015, 7:19 AM Pager: [phone]

## 2015-12-16 ENCOUNTER — Inpatient Hospital Stay (HOSPITAL_COMMUNITY): Payer: Medicaid Other

## 2015-12-16 DIAGNOSIS — I610 Nontraumatic intracerebral hemorrhage in hemisphere, subcortical: Secondary | ICD-10-CM

## 2015-12-16 LAB — BLOOD GAS, ARTERIAL
ACID-BASE EXCESS: 2.3 mmol/L — AB (ref 0.0–2.0)
Bicarbonate: 26.9 mEq/L — ABNORMAL HIGH (ref 20.0–24.0)
Drawn by: 290171
FIO2: 0.4
MECHVT: 550 mL
O2 Saturation: 95.4 %
PATIENT TEMPERATURE: 98.6
PCO2 ART: 46.2 mmHg — AB (ref 35.0–45.0)
PEEP/CPAP: 5 cmH2O
PH ART: 7.383 (ref 7.350–7.450)
RATE: 20 resp/min
TCO2: 28.3 mmol/L (ref 0–100)
pO2, Arterial: 87.1 mmHg (ref 80.0–100.0)

## 2015-12-16 LAB — MAGNESIUM: Magnesium: 2.5 mg/dL — ABNORMAL HIGH (ref 1.7–2.4)

## 2015-12-16 LAB — CBC
HEMATOCRIT: 38.8 % — AB (ref 39.0–52.0)
HEMOGLOBIN: 11.5 g/dL — AB (ref 13.0–17.0)
MCH: 21.6 pg — AB (ref 26.0–34.0)
MCHC: 29.6 g/dL — AB (ref 30.0–36.0)
MCV: 72.8 fL — ABNORMAL LOW (ref 78.0–100.0)
Platelets: 185 10*3/uL (ref 150–400)
RBC: 5.33 MIL/uL (ref 4.22–5.81)
RDW: 19.7 % — ABNORMAL HIGH (ref 11.5–15.5)
WBC: 9.1 10*3/uL (ref 4.0–10.5)

## 2015-12-16 LAB — TRIGLYCERIDES: TRIGLYCERIDES: 101 mg/dL (ref <150)

## 2015-12-16 LAB — BASIC METABOLIC PANEL
Anion gap: 10 (ref 5–15)
BUN: 27 mg/dL — AB (ref 6–20)
CHLORIDE: 123 mmol/L — AB (ref 101–111)
CO2: 26 mmol/L (ref 22–32)
CREATININE: 1.75 mg/dL — AB (ref 0.61–1.24)
Calcium: 9.2 mg/dL (ref 8.9–10.3)
GFR calc Af Amer: 52 mL/min — ABNORMAL LOW (ref >60)
GFR calc non Af Amer: 45 mL/min — ABNORMAL LOW (ref >60)
Glucose, Bld: 144 mg/dL — ABNORMAL HIGH (ref 65–99)
Potassium: 3.8 mmol/L (ref 3.5–5.1)
Sodium: 159 mmol/L — ABNORMAL HIGH (ref 135–145)

## 2015-12-16 LAB — SODIUM
Sodium: 160 mmol/L — ABNORMAL HIGH (ref 135–145)
Sodium: 157 mmol/L — ABNORMAL HIGH (ref 135–145)
Sodium: 158 mmol/L — ABNORMAL HIGH (ref 135–145)

## 2015-12-16 LAB — PHOSPHORUS: Phosphorus: 4.9 mg/dL — ABNORMAL HIGH (ref 2.5–4.6)

## 2015-12-16 LAB — GLUCOSE, CAPILLARY
GLUCOSE-CAPILLARY: 124 mg/dL — AB (ref 65–99)
GLUCOSE-CAPILLARY: 149 mg/dL — AB (ref 65–99)
Glucose-Capillary: 123 mg/dL — ABNORMAL HIGH (ref 65–99)
Glucose-Capillary: 108 mg/dL — ABNORMAL HIGH (ref 65–99)
Glucose-Capillary: 141 mg/dL — ABNORMAL HIGH (ref 65–99)

## 2015-12-16 LAB — VALPROIC ACID LEVEL: Valproic Acid Lvl: 61 ug/mL (ref 50.0–100.0)

## 2015-12-16 LAB — OSMOLALITY: Osmolality: 332 mOsm/kg (ref 275–295)

## 2015-12-16 MED ORDER — DOCUSATE SODIUM 50 MG/5ML PO LIQD
200.0000 mg | Freq: Two times a day (BID) | ORAL | Status: DC
  Administered 2015-12-16 – 2015-12-25 (×11): 200 mg
  Filled 2015-12-16 (×12): qty 20

## 2015-12-16 MED ORDER — FREE WATER
100.0000 mL | Freq: Three times a day (TID) | Status: DC
  Administered 2015-12-16 – 2015-12-17 (×5): 100 mL

## 2015-12-16 MED ORDER — MAGNESIUM CITRATE PO SOLN
1.0000 | Freq: Once | ORAL | Status: AC
  Administered 2015-12-16: 1 via ORAL
  Filled 2015-12-16: qty 296

## 2015-12-16 MED ORDER — LABETALOL HCL 5 MG/ML IV SOLN
20.0000 mg | Freq: Once | INTRAVENOUS | Status: AC
  Administered 2015-12-16: 20 mg via INTRAVENOUS

## 2015-12-16 MED ORDER — CLONIDINE HCL 0.2 MG PO TABS
0.2000 mg | ORAL_TABLET | Freq: Three times a day (TID) | ORAL | Status: DC
  Administered 2015-12-16: 0.2 mg via ORAL
  Filled 2015-12-16: qty 1

## 2015-12-16 NOTE — Clinical Social Work Note (Signed)
Clinical Social Work Assessment  Patient Details  Name: Austin Mccarty MRN: [id number] Date of Birth: [birth year]  Date of referral:  12/16/15               Reason for consult:  Emotional/Coping/Adjustment to Illness, Financial Concerns, Housing Concerns/Homelessness, [employer]                Permission sought to share information with:  Family Supports Permission granted to share information::  Yes, Verbal Permission Granted  Name::     Austin Mccarty  Relationship::  Spouse  Contact Information:  [phone]  Housing/Transportation Living arrangements for the past 2 months:  Single Family Home Source of Information:  Spouse, Other (Comment Required) (Patient cousin) Patient Interpreter Needed:  Arabic Criminal Activity/Legal Involvement Pertinent to Current Situation/Hospitalization:  No - Comment as needed Significant Relationships:  Spouse, Other Family Members, Dependent Children Lives with:  Minor Children, Spouse Do you feel safe going back to the place where you live?  Yes Need for family participation in patient care:  Yes (Comment)  Care giving concerns:  Patient family present at bedside and express sincere concerns regarding bill payments as patient is/was the sole bread winner of the household.  Patient wife and cousin state that patient, wife, and three small children live in an apartment and are unable to make April rent payment - CSW seeking assistance.   Social Worker assessment / plan:  Holiday representative met with patient wife and cousin at bedside to offer support and discuss housing concerns.  Patient lives at home with his wife and children.  Patient wife states that due to patient illness, they will be unable to make the rent payment for the month of April.  CSW seeking assistance and has made contact with the apartment complex to notify of possible assistance opportunities.  CSW to follow up with patient family tomorrow to provide further guidance  and support regarding the bill expenses.  CSW remains available for support and to assist as much as possible.  Employment status:  [employer] information:  Self Pay (Medicaid Pending) PT Recommendations:  Not assessed at this time Information / Referral to community resources:  Shelter, Other (Comment Required) Systems developer)  Patient/Family's Response to care:  Patient spouse and cousin verbalize appreciation for CSW support and involvement.  Patient family just continuing to remain hopeful for patient recovery and stressed about the finances while patient hospitalized.  Patient/Family's Understanding of and Emotional Response to Diagnosis, Current Treatment, and Prognosis:  Patient family emotionally appropriate and even with potential language barrier present with a good understanding of patient needs.  Per MD, patient family not fully aware of patient potential prognosis, but working patient towards weaning off sedation to see if he awakes.  Emotional Assessment Appearance:  Appears stated age Attitude/Demeanor/Rapport:  Unable to Assess Affect (typically observed):  Unable to Assess Orientation:   (Intubated and Sedated) Alcohol / Substance use:  Not Applicable Psych involvement (Current and /or in the community):  No (Comment)  Discharge Needs  Concerns to be addressed:  Discharge Planning Concerns, Coping/Stress Concerns, Financial / Insurance Concerns, Decision making concerns, Adjustment to Illness Readmission within the last 30 days:  Yes Current discharge risk:  Homeless, Inadequate Financial Supports Barriers to Discharge:  Continued Medical Work up  [employer], [place]

## 2015-12-16 NOTE — Progress Notes (Signed)
PULMONARY / CRITICAL CARE MEDICINE   Name: Austin Mccarty MRN: [id number] DOB: 01/24/1966    ADMISSION DATE:  11/26/15  CHIEF COMPLAINT:  PEA arrest  HISTORY OF PRESENT ILLNESS:   50 yo male with h/o HTN, HFrEF, CKD, DCM 2/2 HTN who presented 3/26 from home with loss of consciousness.  Marland Kitchen.  He arrived in the ED with sats in the 60's, emergently intubated then PEA arrest x8815min then ROSC.  He was found to have a large right sided ICH with intraventricular hemorrhage likely 2/2 HTN.  SUBJECTIVE: remains intubated, critically ill  on Cardene infusion  & versed gtt for seizures   VITAL SIGNS: Temp:  [97.8 F (36.6 C)-100.9 F (38.3 C)] 97.8 F (36.6 C) (03/30 0800) Pulse Rate:  [68-95] 73 (03/30 0748) Resp:  [16-24] 20 (03/30 0748) BP: (111-186)/(50-88) 178/83 mmHg (03/30 0748) SpO2:  [94 %-100 %] 97 % (03/30 0748) Arterial Line BP: (150-224)/(63-95) 186/80 mmHg (03/30 0700) FiO2 (%):  [40 %] 40 % (03/30 0748) Weight:  [113.1 kg (249 lb 5.4 oz)] 113.1 kg (249 lb 5.4 oz) (03/30 0415) HEMODYNAMICS: CVP:  [22 mmHg] 22 mmHg VENTILATOR SETTINGS: Vent Mode:  [-] PRVC FiO2 (%):  [40 %] 40 % Set Rate:  [20 bmp] 20 bmp Vt Set:  [550 mL] 550 mL PEEP:  [5 cmH20] 5 cmH20 Plateau Pressure:  [18 cmH20-24 cmH20] 24 cmH20 INTAKE / OUTPUT:  Intake/Output Summary (Last 24 hours) at 12/16/15 [id number] Last data filed at 12/16/15 0700  Gross per 24 hour  Intake   2539 ml  Output   4162 ml  Net  -1623 ml   PHYSICAL EXAMINATION: General:  Sedated. Ventilated, acutely ill. Integument:  Warm & dry. No rash on exposed skin.  HEENT:  No scleral injection or icterus. Endotracheal tube in place.  Cardiovascular:  Regular rate. Normal S1 & S2. No appreciable JVD.  Pulmonary:  Distant breath sounds bilaterally. Symmetric chest wall rise on ventilator. Abdomen: Soft. Normal bowel sounds. Nondistended.  Neurological:  Sedated. Pupils equal. No withdrawal to pain in extremities.  LABS:  CBC  Recent  Labs Lab 12/14/15 0412 12/15/15 0430 12/16/15 0415  WBC 14.1* 11.9* 9.1  HGB 11.1* 12.0* 11.5*  HCT 35.4* 39.1 38.8*  PLT 208 224 185   Coag's  Recent Labs Lab 09/16/2016 0438  APTT 32  INR 1.14   BMET  Recent Labs Lab 12/14/15 0412  12/15/15 0430  12/15/15 1830 12/15/15 2216 12/16/15 0415  NA 154*  < > 159*  < > 159* 162* 159*  K 3.6  --  3.9  --   --   --  3.8  CL 120*  --  123*  --   --   --  123*  CO2 25  --  26  --   --   --  26  BUN 32*  --  19  --   --   --  27*  CREATININE 2.26*  --  1.78*  --   --   --  1.75*  GLUCOSE 142*  --  122*  --   --   --  144*  < > = values in this interval not displayed. Electrolytes  Recent Labs Lab 09/16/2016 0438 12/13/15 0420 12/14/15 0412 12/15/15 0430 12/16/15 0415  CALCIUM 8.2* 8.2* 9.0 9.0 9.2  MG 1.9 1.8  --   --  2.5*  PHOS 6.4* 5.7*  --   --  4.9*   Sepsis Markers  Recent Labs Lab 09/16/2016  0130 12/09/2015 0445 11/21/2015 0909 12/13/15 0420 12/14/15 0412  LATICACIDVEN 5.16* 2.23*  --   --   --   PROCALCITON  --   --  6.55 13.69 5.72   ABG  Recent Labs Lab 12/11/2015 0519 12/14/15 1040 12/16/15 0500  PHART 7.228* 7.356 7.383  PCO2ART 64.0* 47.9* 46.2*  PO2ART 76.0* 84.3 87.1   Liver Enzymes  Recent Labs Lab 11/27/2015 0102 12/13/15 0420  AST 41  --   ALT 47  --   ALKPHOS 48  --   BILITOT 0.5  --   ALBUMIN 2.5* 2.7*   Cardiac Enzymes No results for input(s): TROPONINI, PROBNP in the last 168 hours. Glucose  Recent Labs Lab 12/15/15 1135 12/15/15 1535 12/15/15 1936 12/15/15 2314 12/16/15 0318 12/16/15 0748  GLUCAP 108* 112* 126* 111* 123* 108*   Imaging Ct Head Wo Contrast  12/15/2015  CLINICAL DATA:  Intra cerebral hemorrhage follow-up. EXAM: CT HEAD WITHOUT CONTRAST TECHNIQUE: Contiguous axial images were obtained from the base of the skull through the vertex without intravenous contrast. COMPARISON:  12/13/2015 FINDINGS: There has been interval placement of a right frontal approach  ventriculostomy catheter which terminates in the midline near the anterior aspect of the third ventricle. The ventricles have decreased in size compared to the prior study. 3.6 x 2.4 cm acute parenchymal hemorrhage in the right thalamus has not significantly changed in size, with similar appearance of mild surrounding edema. Blood in the lateral and third ventricles is similar in volume to the prior study. Blood in the fourth ventricle has decreased. Partial effacement of the basilar cisterns is similar to the prior study. Localized leftward midline shift at the level of the thalami measure 6 mm (previously 7 mm). There is diminished gray-white differentiation including of the basal ganglia which has progressed from prior. Diffuse cerebral sulcal effacement has also slightly increased. Orbits are unremarkable. There is moderate mucosal thickening throughout the paranasal sinuses, and a small to moderate-sized fluid level is noted in the right maxillary sinus, increased from prior. Trace mastoid effusions are present. IMPRESSION: 1. Unchanged size of right thalamic hemorrhage. Decreased size of the ventricles following interval ventriculostomy catheter placement. Slightly decreased volume of intraventricular hemorrhage. 2. Decreased gray-white differentiation and diffuse sulcal effacement concerning for diffuse edema. 3. Electronically Signed   By: Sebastian Ache M.D.   On: 12/15/2015 16:27   Dg Chest Port 1 View  12/16/2015  CLINICAL DATA:  Endotracheal tube placement. EXAM: PORTABLE CHEST 1 VIEW COMPARISON:  December 15, 2015. FINDINGS: Stable cardiomediastinal silhouette. Endotracheal and nasogastric tubes are unchanged in position. No pneumothorax is noted. Stable bibasilar opacities are noted concerning for pneumonia or edema. Possible minimal bilateral pleural effusions may be present. IMPRESSION: Stable support apparatus. Stable bibasilar opacities as described above. Electronically Signed   By: Lupita Raider,  M.D.   On: 12/16/2015 07:27   EVENTS: 3/26 - cardiac arrest 3/27 - high dose versed for seizures 3/27 - ventriculostomy  STUDIES: TTE (10/29/15):  EF 30-35% w/ diffuse hypokinesis. No AS or AR. Mild MR but no MS. RV moderately dilated w/ normal systolic function. LA severely dilated & RA mildly dilated. CT HEAD 2:19AM 3/26:  R Thalamic hemorrhage as well as IVH w/ mild mass effect & 2mm right to left midline shift. Possible mild diffuse cerebral edema. Echo 3/27 EF 40%  MICROBIOLOGY: Blood Ctx x2 3/26>>> Urine Ctx 3/26>>> Tracheal Asp Ctx 3/26>>> Influenza PCR 3/26>>> Respiratory Viral Panel PCR 3/26>>> Urine Strep Ag 3/26>>> Urine Legionella Ag  3/26>>> MRSA PCR 3/26:  Negative   ANTIBIOTICS: Vancomycin 3/26>>> Zosyn 3/26>>>  LINES/TUBES: OETT 7.5 3/26>>> OGT 3/26>>> FOLEY 3/26>>> 3/27 vstomy >>  ASSESSMENT / PLAN:  NEUROLOGIC A:   Right sided ICH - Sub-falcine herniation. Intraventricular Hemorrhage Status epilepticus UDS neg P:   Neurology following. RASS goal: 0. Decrease versed 1 mg/hr til off or begins to seize. Keppra & valproate IV. SBP goal 120-140.  PULMONARY A: Acute Hypoxic/ hypercarbic  Respiratory Failure - Secondary to pulmonary edema. Pulmonary Edema - Cardiogenic vs Neurogenic  P:   Full Vent Support while on sedation. Titrate O2 for sat of 88-92%. Adjust vent for ABG. ABG and CXR in AM. Move ETT down by 2.  CARDIOVASCULAR A:  Possible Acute Systolic CHF Exacerbation - EF 30-35% 10/2015. Hypertensive Emergency  P:  Cardene gtt to maintain SBP 120-140, now off. Hold anti-HTN for now.  RENAL A:   Acute on Chronic Renal Failure - likely hypervolemic/cardiorenal syndrome. Lactic Acidosis - Resolving. Hyponatremia on admit, now on 3% saline  P:   Monitoring UOP with Foley Trending electrolytes & renal function daily Replace electrolytes as indicated Hold 3% for now. Free water per neuro.  GASTROINTESTINAL A:   No acute  issues.  P: Protonix IV daily Continue TFs Colace and mag citrate as ordered.  HEMATOLOGIC A:   Leukocytosis - Sepsis vs Stress Induced.  P:  Trending cell counts daily w/ CBC SCDs  INFECTIOUS A:   Possible CAP Possible UTI Fever overnight without evidence of infection.  P:   Empiric Vancomycin & Zosyn. Procalcitonin per algorithm noted, elevated but dropping.  ENDOCRINE A:   Hyperglycemia - No h/o DM.  P:   Accu-Checks q4hr SSI per algorithm  FAMILY UPDATES:  Brother updated at bedside 3/30  The patient is critically ill with multiple organ systems failure and requires high complexity decision making for assessment and support, frequent evaluation and titration of therapies, application of advanced monitoring technologies and extensive interpretation of multiple databases.   Critical Care Time devoted to patient care services described in this note is  35  Minutes. This time reflects time of care of this signee Dr Koren Bound. This critical care time does not reflect procedure time, or teaching time or supervisory time of PA/NP/Med student/Med Resident etc but could involve care discussion time.  Alyson Reedy, M.D. [facility] Pulmonary/Critical Care Medicine. Pager: [phone]. After hours pager: [phone].  12/16/2015, 9:07 AM

## 2015-12-16 NOTE — Progress Notes (Signed)
Pt continues on full vent support with versed wean.  CSW following to assist family with housing issues, as they are in jeopardy of losing their rental home, as pt has been unable to work since November.  They are behind in their rent, and may be evicted on April 5.    CSW/Case Management will continue to follow as pt progresses.    Quintella BatonJulie W. Marley Charlot, RN, BSN  Trauma/Neuro ICU Case Manager [phone]

## 2015-12-16 NOTE — Progress Notes (Signed)
Patient ID: Austin Mccarty, male   DOB: 09/30/1965, 50 y.o.   MRN: [id number] BP 168/88 mmHg  Pulse 68  Temp(Src) 97.3 F (36.3 C) (Axillary)  Resp 20  Ht 6\' 1"  (1.854 m)  Wt 113.1 kg (249 lb 5.4 oz)  BMI 32.90 kg/m2  SpO2 96% Ventricular catheter continues to drain.  No changes noted on physical exam after ventric raised.

## 2015-12-16 NOTE — Progress Notes (Signed)
CRITICAL VALUE ALERT  Critical value received:  Sodium 162  Date of notification:  12/15/15  Time of notification:  2255  Critical value read back:Yes.    Nurse who received alert:  Kathlene CoteJamie Vestal Markin, RN  MD notified (1st page):  Willeen NieceM. Kirkpatrick  Time of first page:  2256  Responding MD:  Willeen NieceM. Kirkpatrick  Order to be placed for free water flushes per Dr. Amada JupiterKirkpatrick.

## 2015-12-16 NOTE — Progress Notes (Signed)
STROKE TEAM PROGRESS NOTE   SUBJECTIVE (INTERVAL HISTORY) His brother was at the bedside. EVD drainage patent. LTM EEG showed no seizure. Continue to wean off versed.Off cardene, but BP still at high side. Afebrile so far, but still not following commands or respond to pain stimulation. depakote level 61      OBJECTIVE Temp:  [97.3 F (36.3 C)-99 F (37.2 C)] 97.3 F (36.3 C) (03/30 2000) Pulse Rate:  [63-91] 67 (03/30 2215) Cardiac Rhythm:  [-] Normal sinus rhythm (03/30 2000) Resp:  [11-23] 20 (03/30 2215) BP: (135-191)/(66-173) 135/71 mmHg (03/30 2215) SpO2:  [95 %-100 %] 96 % (03/30 2215) Arterial Line BP: (137-224)/(68-95) 137/68 mmHg (03/30 2215) FiO2 (%):  [30 %-40 %] 30 % (03/30 2000) Weight:  [249 lb 5.4 oz (113.1 kg)] 249 lb 5.4 oz (113.1 kg) (03/30 0415)  CBC:   Recent Labs Lab May 02, 2016 0102  12/13/15 0420  12/15/15 0430 12/16/15 0415  WBC 17.0*  < > 21.2*  < > 11.9* 9.1  NEUTROABS 9.1*  --  19.2*  --   --   --   HGB 13.0  < > 12.1*  < > 12.0* 11.5*  HCT 40.1  < > 38.0*  < > 39.1 38.8*  MCV 69.7*  < > 67.9*  < > 72.5* 72.8*  PLT 289  < > 232  < > 224 185  < > = values in this interval not displayed.  Basic Metabolic Panel:  Recent Labs Lab 12/13/15 0420  12/15/15 0430  12/16/15 0415 12/16/15 1015 12/16/15 1639  NA 138  138  < > 159*  < > 159* 160* 157*  K 4.2  < > 3.9  --  3.8  --   --   CL 102  < > 123*  --  123*  --   --   CO2 23  < > 26  --  26  --   --   GLUCOSE 126*  < > 122*  --  144*  --   --   BUN 34*  < > 19  --  27*  --   --   CREATININE 2.41*  < > 1.78*  --  1.75*  --   --   CALCIUM 8.2*  < > 9.0  --  9.2  --   --   MG 1.8  --   --   --  2.5*  --   --   PHOS 5.7*  --   --   --  4.9*  --   --   < > = values in this interval not displayed.  Lipid Panel:     Component Value Date/Time   CHOL 109 10/29/2015 0420   TRIG 101 12/16/2015 0415   HDL 34* 10/29/2015 0420   CHOLHDL 3.2 10/29/2015 0420   VLDL 12 10/29/2015 0420   LDLCALC 63  10/29/2015 0420   HgbA1c:  Lab Results  Component Value Date   HGBA1C 5.9* 05/14/16   Urine Drug Screen:     Component Value Date/Time   LABOPIA NONE DETECTED 05/14/16 0959   COCAINSCRNUR NONE DETECTED 05/14/16 0959   LABBENZ NONE DETECTED 05/14/16 0959   AMPHETMU NONE DETECTED 05/14/16 0959   THCU NONE DETECTED 05/14/16 0959   LABBARB NONE DETECTED 05/14/16 0959      IMAGING I have personally reviewed the radiological images below and agree with the radiology interpretations.  Ct Head Wo Contrast September 05, 2016  Right thalamic hemorrhage as well as intraventricular hemorrhages with  mild mass effect and a 2 mm right-to-left midline shift. Diffuse effacement of the sulci with possible mild diffuse cerebral edema.   12/13/2015  IMPRESSION: 1. Stable size of right basal ganglia hemorrhage. 2. Slight increase in midline shift, now measuring 7 mm. 3. Increased prominence of the temporal horns of the lateral ventricles compatible with developing hydrocephalus.   12/15/2015  IMPRESSION: 1. Unchanged size of right thalamic hemorrhage. Decreased size of the ventricles following interval ventriculostomy catheter placement. Slightly decreased volume of intraventricular hemorrhage. 2. Decreased gray-white differentiation and diffuse sulcal effacement concerning for diffuse edema.   Dg Chest Portable 1 View 12/10/2015   Appliances appear to be in satisfactory location. Diffuse airspace disease throughout both lungs.   12/15/2015  IMPRESSION: 1. Left IJ line tip noted in unchanged position to the left of midline. Reference is made to prior chest x-ray report of 12/13/2015. Again intra-arterial location of the left IJ line cannot be excluded. This finding was reported verbally to the patient's caregiver on 12/13/2015. Endotracheal tube and NG tube in stable position. 2. Cardiomegaly with persistent bilateral pulmonary infiltrates/pulmonary edema. Similar findings noted on prior exam.    EEG 12/07/2015 Abnormal EEG in the comatose state demonstrating: 1. Burst suppression pattern (1-10 seconds of burst activity with up to 10-30 seconds of suppression). 2. During the burst activity, there are frequent right frontal-temporal poly-spike and spike-and-wave discharges. These are associated with myoclonic jerks on video recording.  LTM EEG  12/13/15 This EEG is consistent with diffuse non-specific cerebral dysfunction, with epileptiform discharges in the right hemisphere that improve though the duration of the recording. No electrographic seizures were seen. 12/14/15 This EEG is consistent with diffuse non-specific cerebral dysfunction, with suggestion of cortical irritability in the right hemisphere. No electrographic seizures were seen.  TTE - - Left ventricle: The cavity size was moderately dilated. Wall  thickness was normal. The estimated ejection fraction was 40%.  Diffuse hypokinesis.  Renal artery ultrasound - pending     PHYSICAL EXAMONATION  Temp:  [97.3 F (36.3 C)-99 F (37.2 C)] 97.3 F (36.3 C) (03/30 2000) Pulse Rate:  [63-91] 67 (03/30 2215) Resp:  [11-23] 20 (03/30 2215) BP: (135-191)/(66-173) 135/71 mmHg (03/30 2215) SpO2:  [95 %-100 %] 96 % (03/30 2215) Arterial Line BP: (137-224)/(68-95) 137/68 mmHg (03/30 2215) FiO2 (%):  [30 %-40 %] 30 % (03/30 2000) Weight:  [249 lb 5.4 oz (113.1 kg)] 249 lb 5.4 oz (113.1 kg) (03/30 0415)  General - Well nourished, well developed, intubated and sedated.  Ophthalmologic - Fundi not visualized due to ET positioning.  Cardiovascular - Regular rhythm, but tachycardia.  Neuro - intubated and sedated, not respond to voice. PERRL, sluggish, but no dolls eyes or corneal, with weak gag. Right UE trace withdraw on pain, no movement on other limbs on pain stimulation. DTR diminished and no babinski. Sensation, coordination and gait not tested.   ASSESSMENT/PLAN Mr. Austin Mccarty is a 50 y.o. male with  history of chronic kidney disease, community-acquired pneumonia, and hypertension, congestive heart failure, presenting with confusion, dysarthria, and subsequent loss of consciousness. Patient required intubation and went into PEA arrest for 15 min with resuscitation. Found to have a right BG ICH with IVH.  He did not receive IV t-PA due to hemorrhage.  Stroke:  right BG intracerebral hemorrhage with ventricular extension.  CT showed right BG ICH with IVH  Repeat CT head showed stable hematoma but obstructive hydrocephalus  Repeat CT again showed stable hematoma and decreased hydrocephalus after EVD  2D Echo EF 40%, improved from prior.  LDL 60 on 10/29/2015  HgbA1c 5.9  VTE prophylaxis - SCDs and heparin subq Diet NPO time specified  No antithrombotic prior to admission, now on No antithrombotic secondary to hemorrhage.  Ongoing aggressive stroke risk factor management  Disposition - pending  Obstructive hydrocephalus  CT repeat showed above findings  EVD placed by NSG  Repeat CT showed decreased hydrocephalus  Seizure  Initial stat EEG burst suppression with myoclonic status  Put on versed drip high dose - weaning off  Put on keppra and depakote  LTM EEG so far showed no seizure but slowing  Maximize keppra to  bid  depakote load again today and increase to  Q8  depakote level 33->48->69->61  Continue LTM EEG with versed weaning off  Cerebral edema  CT repeat showed increase midline shift  D/c mannitol  3% saline on hold  Na Q6h 149->154->157->160->157  Na goal 150-155  Cardiac arrest  Likely related to ICH  Resuscitated  Burst suppression on EEG but resolved on versed  NSR with tachycardia now  respiratory failure and aspiration pneumonia  Intubated   CCM on board  On vanco and zosyn   Leukocytosis 17.0->21.2->14.1->11.9->9.1  Hypertension  Managed with Cardene   Developed with hypotension and on Neo, now off  BP  goal < 160  Resumed home meds coreg, hydralazine, imdur, and cozaar  Increased hydralazine to  tid  Add clonidine 0.2mg  tid for better control  Fever, resolved  UA negative  BCx NGTD  Discuss with NSG about CSF sampling, but unlikely ventriculitis  CXR stable  On vanco and zosyn  Central fever not excluded  Other Stroke Risk Factors  Cigarette smoker, quit smoking 8 years ago.  Obesity, Body mass index is 32.9 kg/(m^2).   Other Active Problems  Cardiomyopathy EF 40%   Chronic kidney disease, Cre 2.41->2.26->1.78  Hospital day # 4  This patient is critically ill due to right ICH and IVH, hydrocephalus, seizure, cardiac arrest, cerebral edema and at significant risk of neurological worsening, death form recurrent bleeding, brain herniation, heart failure, cardiac arrest, hydrocephalus. This patient's care requires constant monitoring of vital signs, hemodynamics, respiratory and cardiac monitoring, review of multiple databases, neurological assessment, discussion with family, other specialists and medical decision making of high complexity. I spent 35 minutes of neurocritical care time in the care of this patient.  Marvel Plan, MD PhD Stroke Neurology 12/16/2015 10:29 PM    To contact Stroke Continuity provider, please refer to [email or web address]. After hours, contact General Neurology

## 2015-12-17 ENCOUNTER — Inpatient Hospital Stay (HOSPITAL_COMMUNITY): Payer: Medicaid Other

## 2015-12-17 DIAGNOSIS — G931 Anoxic brain damage, not elsewhere classified: Secondary | ICD-10-CM

## 2015-12-17 LAB — CBC
HCT: 39.2 % (ref 39.0–52.0)
Hemoglobin: 11.5 g/dL — ABNORMAL LOW (ref 13.0–17.0)
MCH: 21.4 pg — ABNORMAL LOW (ref 26.0–34.0)
MCHC: 29.3 g/dL — ABNORMAL LOW (ref 30.0–36.0)
MCV: 73 fL — ABNORMAL LOW (ref 78.0–100.0)
PLATELETS: 161 10*3/uL (ref 150–400)
RBC: 5.37 MIL/uL (ref 4.22–5.81)
RDW: 19.9 % — AB (ref 11.5–15.5)
WBC: 7.8 10*3/uL (ref 4.0–10.5)

## 2015-12-17 LAB — POCT I-STAT 3, ART BLOOD GAS (G3+)
Acid-Base Excess: 5 mmol/L — ABNORMAL HIGH (ref 0.0–2.0)
Bicarbonate: 30.8 mEq/L — ABNORMAL HIGH (ref 20.0–24.0)
O2 SAT: 95 %
TCO2: 32 mmol/L (ref 0–100)
pCO2 arterial: 48.9 mmHg — ABNORMAL HIGH (ref 35.0–45.0)
pH, Arterial: 7.407 (ref 7.350–7.450)
pO2, Arterial: 74 mmHg — ABNORMAL LOW (ref 80.0–100.0)

## 2015-12-17 LAB — BASIC METABOLIC PANEL
ANION GAP: 8 (ref 5–15)
BUN: 30 mg/dL — ABNORMAL HIGH (ref 6–20)
CALCIUM: 9.3 mg/dL (ref 8.9–10.3)
CO2: 29 mmol/L (ref 22–32)
Chloride: 122 mmol/L — ABNORMAL HIGH (ref 101–111)
Creatinine, Ser: 1.36 mg/dL — ABNORMAL HIGH (ref 0.61–1.24)
GFR calc Af Amer: >60 mL/min (ref >60)
Glucose, Bld: 158 mg/dL — ABNORMAL HIGH (ref 65–99)
POTASSIUM: 4.2 mmol/L (ref 3.5–5.1)
SODIUM: 159 mmol/L — AB (ref 135–145)

## 2015-12-17 LAB — CULTURE, BLOOD (ROUTINE X 2)
Culture: NO GROWTH
Culture: NO GROWTH

## 2015-12-17 LAB — GLUCOSE, CAPILLARY
GLUCOSE-CAPILLARY: 128 mg/dL — AB (ref 65–99)
GLUCOSE-CAPILLARY: 134 mg/dL — AB (ref 65–99)
GLUCOSE-CAPILLARY: 140 mg/dL — AB (ref 65–99)
Glucose-Capillary: 156 mg/dL — ABNORMAL HIGH (ref 65–99)
Glucose-Capillary: 121 mg/dL — ABNORMAL HIGH (ref 65–99)
Glucose-Capillary: 129 mg/dL — ABNORMAL HIGH (ref 65–99)

## 2015-12-17 LAB — CATECHOLAMINES, FRACTIONATED, PLASMA
DOPAMINE: 104 pg/mL — AB (ref 0–48)
Epinephrine: 89 pg/mL — ABNORMAL HIGH (ref 0–62)
Norepinephrine: 1310 pg/mL — ABNORMAL HIGH (ref 0–874)

## 2015-12-17 LAB — PHOSPHORUS: Phosphorus: 3.7 mg/dL (ref 2.5–4.6)

## 2015-12-17 LAB — VALPROIC ACID LEVEL: VALPROIC ACID LVL: 51 ug/mL (ref 50.0–100.0)

## 2015-12-17 LAB — MAGNESIUM: MAGNESIUM: 2.6 mg/dL — AB (ref 1.7–2.4)

## 2015-12-17 LAB — SODIUM
SODIUM: 159 mmol/L — AB (ref 135–145)
SODIUM: 158 mmol/L — AB (ref 135–145)

## 2015-12-17 MED ORDER — FREE WATER
200.0000 mL | Status: DC
  Administered 2015-12-17 – 2015-12-21 (×28): 200 mL

## 2015-12-17 MED ORDER — VITAL HIGH PROTEIN PO LIQD
1000.0000 mL | ORAL | Status: DC
  Administered 2015-12-18 – 2015-12-20 (×4): 1000 mL
  Filled 2015-12-17: qty 1000

## 2015-12-17 MED ORDER — CLONIDINE HCL 0.1 MG PO TABS
0.1000 mg | ORAL_TABLET | Freq: Once | ORAL | Status: AC
  Administered 2015-12-17: 0.1 mg via ORAL
  Filled 2015-12-17: qty 1

## 2015-12-17 MED ORDER — CLONIDINE HCL 0.2 MG PO TABS
0.3000 mg | ORAL_TABLET | Freq: Three times a day (TID) | ORAL | Status: DC
  Administered 2015-12-17 – 2015-12-22 (×16): 0.3 mg via ORAL
  Filled 2015-12-17 (×17): qty 1

## 2015-12-17 MED ORDER — CLEVIDIPINE BUTYRATE 0.5 MG/ML IV EMUL: 0.0000 mg/h | INTRAVENOUS | Status: DC

## 2015-12-17 NOTE — Progress Notes (Signed)
LDA trach charted in error

## 2015-12-17 NOTE — Progress Notes (Signed)
Patient ID: Austin Mccarty, male   DOB: 08/10/1966, 50 y.o.   MRN: [id number] BP 137/81 mmHg  Pulse 88  Temp(Src) 99.6 F (37.6 C) (Axillary)  Resp 21  Ht 6\' 1"  (1.854 m)  Wt 114.1 kg (251 lb 8.7 oz)  BMI 33.19 kg/m2  SpO2 93% Comatose. Not responsive Ventricular catheter draining.  Mri shows anoxic injury.  Discussions about continued care will be ongoing.

## 2015-12-17 NOTE — Clinical Social Work Note (Addendum)
Clinical Social Worker continuing to follow patient and family for support and financial resources.  CSW did receive approval to cover patient and family apartment rent for April and May.  Patient wife extremely tearful and grateful upon receiving the news.  Patient family is also requesting the presence of patient brother who currently lives in [place].  CSW provided RN with the [facility] [place] to provide to family, however with the current travel bans and other political barriers, there is limited ability to have patient brother present.  CSW updated RN and MD regarding these concerns.  Patient family to have a meeting with MD tomorrow regarding patient current medical status and neuro decline.  CSW remains available for support and to assist patient and family as needed.  Austin GoldsJesse Gust [place], [place] [phone]

## 2015-12-17 NOTE — Progress Notes (Signed)
STROKE TEAM PROGRESS NOTE   SUBJECTIVE (INTERVAL HISTORY) His brother was at the bedside. EVD drainage patent. LTM EEG continues to be flat, has been off sedation for 2 days now. Back on cardene due to high BP. Will increase clonidine. Afebrile, not following commands or respond to pain stimulation. depakote level 51.    OBJECTIVE Temp:  [97.3 F (36.3 C)-99.6 F (37.6 C)] 99.6 F (37.6 C) (03/31 1600) Pulse Rate:  [63-102] 92 (03/31 1500) Cardiac Rhythm:  [-] Normal sinus rhythm (03/31 1200) Resp:  [0-28] 27 (03/31 1500) BP: (116-191)/(64-173) 162/75 mmHg (03/31 1500) SpO2:  [91 %-100 %] 96 % (03/31 1535) Arterial Line BP: (114-212)/(56-95) 153/67 mmHg (03/31 1300) FiO2 (%):  [30 %] 30 % (03/31 1535) Weight:  [251 lb 8.7 oz (114.1 kg)] 251 lb 8.7 oz (114.1 kg) (03/31 0500)  CBC:   Recent Labs Lab 11/24/2015 0102  12/13/15 0420  12/16/15 0415 12/17/15 0406  WBC 17.0*  < > 21.2*  < > 9.1 7.8  NEUTROABS 9.1*  --  19.2*  --   --   --   HGB 13.0  < > 12.1*  < > 11.5* 11.5*  HCT 40.1  < > 38.0*  < > 38.8* 39.2  MCV 69.7*  < > 67.9*  < > 72.8* 73.0*  PLT 289  < > 232  < > 185 161  < > = values in this interval not displayed.  Basic Metabolic Panel:  Recent Labs Lab 12/16/15 0415  12/17/15 0406 12/17/15 0915  NA 159*  < > 159* 159*  K 3.8  --  4.2  --   CL 123*  --  122*  --   CO2 26  --  29  --   GLUCOSE 144*  --  158*  --   BUN 27*  --  30*  --   CREATININE 1.75*  --  1.36*  --   CALCIUM 9.2  --  9.3  --   MG 2.5*  --  2.6*  --   PHOS 4.9*  --  3.7  --   < > = values in this interval not displayed.  Lipid Panel:     Component Value Date/Time   CHOL 109 10/29/2015 0420   TRIG 101 12/16/2015 0415   HDL 34* 10/29/2015 0420   CHOLHDL 3.2 10/29/2015 0420   VLDL 12 10/29/2015 0420   LDLCALC 63 10/29/2015 0420   HgbA1c:  Lab Results  Component Value Date   HGBA1C 5.9* 12/06/2015   Urine Drug Screen:     Component Value Date/Time   LABOPIA NONE DETECTED  12/10/2015 0959   COCAINSCRNUR NONE DETECTED 11/29/2015 0959   LABBENZ NONE DETECTED 12/07/2015 0959   AMPHETMU NONE DETECTED 11/28/2015 0959   THCU NONE DETECTED 12/06/2015 0959   LABBARB NONE DETECTED 11/22/2015 0959      IMAGING I have personally reviewed the radiological images below and agree with the radiology interpretations.  Ct Head Wo Contrast 11/17/2015  Right thalamic hemorrhage as well as intraventricular hemorrhages with mild mass effect and a 2 mm right-to-left midline shift. Diffuse effacement of the sulci with possible mild diffuse cerebral edema.   12/13/2015  IMPRESSION: 1. Stable size of right basal ganglia hemorrhage. 2. Slight increase in midline shift, now measuring 7 mm. 3. Increased prominence of the temporal horns of the lateral ventricles compatible with developing hydrocephalus.   12/15/2015  IMPRESSION: 1. Unchanged size of right thalamic hemorrhage. Decreased size of the ventricles following interval ventriculostomy  catheter placement. Slightly decreased volume of intraventricular hemorrhage. 2. Decreased gray-white differentiation and diffuse sulcal effacement concerning for diffuse edema.   Dg Chest Portable 1 View 11/28/2015   Appliances appear to be in satisfactory location. Diffuse airspace disease throughout both lungs.   12/15/2015  IMPRESSION: 1. Left IJ line tip noted in unchanged position to the left of midline. Reference is made to prior chest x-ray report of 12/13/2015. Again intra-arterial location of the left IJ line cannot be excluded. This finding was reported verbally to the patient's caregiver on 12/13/2015. Endotracheal tube and NG tube in stable position. 2. Cardiomegaly with persistent bilateral pulmonary infiltrates/pulmonary edema. Similar findings noted on prior exam.   EEG 11/22/2015 Abnormal EEG in the comatose state demonstrating: 1. Burst suppression pattern (1-10 seconds of burst activity with up to 10-30 seconds of suppression). 2.  During the burst activity, there are frequent right frontal-temporal poly-spike and spike-and-wave discharges. These are associated with myoclonic jerks on video recording.  LTM EEG  12/13/15 This EEG is consistent with diffuse non-specific cerebral dysfunction, with epileptiform discharges in the right hemisphere that improve though the duration of the recording. No electrographic seizures were seen. 12/14/15 This EEG is consistent with diffuse non-specific cerebral dysfunction, with suggestion of cortical irritability in the right hemisphere. No electrographic seizures were seen.  TTE - - Left ventricle: The cavity size was moderately dilated. Wall  thickness was normal. The estimated ejection fraction was 40%.  Diffuse hypokinesis.  Renal artery ultrasound - - Bilateral abnormal intrarenal resistive indices. - No evidence of right renal artery stenosis. - No evidence of left renal artery stenosis.  Mri and Mra Head Wo Contrast 12/17/2015  IMPRESSION: 1. Bilateral cortical edema/infarction and small caudate infarcts compatible with global anoxic injury. 2. Size stable right thalamic and intraventricular hemorrhage. No MRA finding to explain the hematoma. 3. Right frontal shunt with stable ventricular volume and preferential right lateral ventricle drainage. 4. Progressive sinusitis and mastoiditis.    PHYSICAL EXAMONATION  Temp:  [97.3 F (36.3 C)-99.6 F (37.6 C)] 99.6 F (37.6 C) (03/31 1600) Pulse Rate:  [63-102] 92 (03/31 1500) Resp:  [0-28] 27 (03/31 1500) BP: (116-191)/(64-173) 162/75 mmHg (03/31 1500) SpO2:  [91 %-100 %] 96 % (03/31 1535) Arterial Line BP: (114-212)/(56-95) 153/67 mmHg (03/31 1300) FiO2 (%):  [30 %] 30 % (03/31 1535) Weight:  [251 lb 8.7 oz (114.1 kg)] 251 lb 8.7 oz (114.1 kg) (03/31 0500)  General - Well nourished, well developed, intubated and off sedation.  Ophthalmologic - Fundi not visualized due to ET positioning.  Cardiovascular - Regular rhythm  and rate.  Neuro - intubated and off sedation, not respond to voice. No papillary reflexes, no dolls eyes, but weak corneal bilaterally, with very weak gag. Not withdraw to pain in all extremities. DTR diminished and no babinski. Sensation, coordination and gait not tested. Not breathing over the vent.   ASSESSMENT/PLAN Mr. Austin Mccarty is a 50 y.o. male with history of chronic kidney disease, community-acquired pneumonia, and hypertension, congestive heart failure, presenting with confusion, dysarthria, and subsequent loss of consciousness. Patient required intubation and went into PEA arrest for 15 min with resuscitation. Found to have a right BG ICH with IVH.  He did not receive IV t-PA due to hemorrhage.  Global anoxic brain injury due to prolonged cardiac arrest  Cardiac arrest for  MRI consistent with global anoxic brain injury  EEG myoclonic status -> flat lines  Neuro exam showed weak corneal and gag, no papillary  or dolls eyes, not breathing over the vent  Poor prognosis  I had long discussion with brother at bedside regarding the current diagnosis and prognosis including persistent vegetative state. He would like to have further discussion with family members.  ICH:  right BG ICH with ventricular extension.  CT showed right BG ICH with IVH  Repeat CT head showed stable hematoma but obstructive hydrocephalus  Repeat CT again showed stable hematoma and decreased hydrocephalus after EVD  2D Echo EF 40%, improved from prior.  LDL 60 on 10/29/2015  HgbA1c 5.9  VTE prophylaxis - SCDs and heparin subq Diet NPO time specified  No antithrombotic prior to admission, now on No antithrombotic secondary to hemorrhage.  Ongoing aggressive stroke risk factor management  Disposition - pending  Obstructive hydrocephalus  CT repeat showed above findings  EVD placed by NSG  Repeat CT showed decreased hydrocephalus  MRI showed stable ventricle  size  Seizure  Initial stat EEG burst suppression with myoclonic status  Put on versed drip high dose - weaning off  Put on keppra and depakote  LTM EEG so far showed no seizure but slowing -> flat lines  Maximize keppra to  bid  depakote load again and increase to  Q8  depakote level 33->48->69->61->51  Continue LTM EEG flat lines with versed weaning off - currently off  Cerebral edema  CT repeat showed increase midline shift  D/c mannitol  3% saline on hold  Na Q6h 149->154->157->160->157->159  Na goal 150-155  On free water  Cardiac arrest  Likely related to IVH down to IV ventricle  Resuscitated  Burst suppression on EEG, resolved on versed -> slowing -> flat lines  NSR now  respiratory failure and aspiration pneumonia  Intubated   CCM on board  On vanco and zosyn   Leukocytosis 17.0->21.2->14.1->11.9->9.1->7.8  Hypertension  Managed with Cardene PRN  Developed with hypotension and on Neo, now off  BP goal < 160  Resumed home meds coreg, hydralazine, imdur, and cozaar  Increased hydralazine to  tid  Increase clonidine to 0.3 mg tid  Fever, resolved  UA negative  BCx NGTD  Discuss with NSG about CSF sampling, but unlikely ventriculitis  CXR stable  On vanco and zosyn  Central fever possible  Other Stroke Risk Factors  Cigarette smoker, quit smoking 8 years ago.  Obesity, Body mass index is 33.19 kg/(m^2).   Other Active Problems  Cardiomyopathy EF 40%   Chronic kidney disease, Cre 2.41->2.26->1.78 ->1.36  Hospital day # 5  This patient is critically ill due to right ICH and IVH, hydrocephalus, seizure, cardiac arrest, cerebral edema and at significant risk of neurological worsening, death form recurrent bleeding, brain herniation, heart failure, cardiac arrest, hydrocephalus. This patient's care requires constant monitoring of vital signs, hemodynamics, respiratory and cardiac monitoring, review of  multiple databases, neurological assessment, discussion with family, other specialists and medical decision making of high complexity. I spent 45 minutes of neurocritical care time in the care of this patient. I had long discussion with brother at bedside regarding the current diagnosis and prognosis including persistent vegetative state. He would like to have further discussion with family members.  Marvel Plan, MD PhD Stroke Neurology 12/17/2015 4:28 PM    To contact Stroke Continuity provider, please refer to [email or web address]. After hours, contact General Neurology

## 2015-12-17 NOTE — Progress Notes (Signed)
vLTM EEG complete prior to MRI. No skin breakdown. Results pending

## 2015-12-17 NOTE — Progress Notes (Signed)
PULMONARY / CRITICAL CARE MEDICINE   Name: Austin Mccarty MRN: [id number] DOB: [birth year]    ADMISSION DATE:  12/04/2015  CHIEF COMPLAINT:  PEA arrest  HISTORY OF PRESENT ILLNESS:   50 yo male with h/o HTN, HFrEF, CKD, DCM 2/2 HTN who presented 3/26 from home with loss of consciousness.  Marland Kitchen  He arrived in the ED with sats in the 60's, emergently intubated then PEA arrest x78min then ROSC.  He was found to have a large right sided ICH with intraventricular hemorrhage likely 2/2 HTN.  SUBJECTIVE: Off sedation, no seizure and remains unresponsive.  VITAL SIGNS: Temp:  [97.3 F (36.3 C)-98 F (36.7 C)] 97.3 F (36.3 C) (03/31 0800) Pulse Rate:  [63-102] 97 (03/31 0900) Resp:  [0-28] 22 (03/31 0800) BP: (123-191)/(70-173) 151/79 mmHg (03/31 0900) SpO2:  [92 %-99 %] 92 % (03/31 0900) Arterial Line BP: (132-212)/(67-95) 163/72 mmHg (03/31 0900) FiO2 (%):  [30 %] 30 % (03/31 0800) Weight:  [114.1 kg (251 lb 8.7 oz)] 114.1 kg (251 lb 8.7 oz) (03/31 0500) HEMODYNAMICS:   VENTILATOR SETTINGS: Vent Mode:  [-] PRVC FiO2 (%):  [30 %] 30 % Set Rate:  [20 bmp] 20 bmp Vt Set:  [550 mL] 550 mL PEEP:  [5 cmH20] 5 cmH20 Plateau Pressure:  [21 cmH20-24 cmH20] 23 cmH20 INTAKE / OUTPUT:  Intake/Output Summary (Last 24 hours) at 12/17/15 0950 Last data filed at 12/17/15 0900  Gross per 24 hour  Intake 2946.54 ml  Output   2543 ml  Net 403.54 ml   PHYSICAL EXAMINATION: General:  Sedated. Ventilated, acutely ill.  Unresponsive off sedation. Integument:  Warm & dry. No rash on exposed skin.  HEENT:  No scleral injection or icterus. Endotracheal tube in place.  Cardiovascular:  Regular rate. Normal S1 & S2. No appreciable JVD.  Pulmonary:  Distant breath sounds bilaterally. Symmetric chest wall rise on ventilator. Abdomen: Soft. Normal bowel sounds. Nondistended.  Neurological:  Sedated. Pupils equal. No withdrawal to pain in extremities.  LABS:  CBC  Recent Labs Lab 12/15/15 0430  12/16/15 0415 12/17/15 0406  WBC 11.9* 9.1 7.8  HGB 12.0* 11.5* 11.5*  HCT 39.1 38.8* 39.2  PLT 224 185 161   Coag's  Recent Labs Lab 11/25/2015 0438  APTT 32  INR 1.14   BMET  Recent Labs Lab 12/15/15 0430  12/16/15 0415  12/16/15 1639 12/16/15 2223 12/17/15 0406  NA 159*  < > 159*  < > 157* 158* 159*  K 3.9  --  3.8  --   --   --  4.2  CL 123*  --  123*  --   --   --  122*  CO2 26  --  26  --   --   --  29  BUN 19  --  27*  --   --   --  30*  CREATININE 1.78*  --  1.75*  --   --   --  1.36*  GLUCOSE 122*  --  144*  --   --   --  158*  < > = values in this interval not displayed. Electrolytes  Recent Labs Lab 12/13/15 0420  12/15/15 0430 12/16/15 0415 12/17/15 0406  CALCIUM 8.2*  < > 9.0 9.2 9.3  MG 1.8  --   --  2.5* 2.6*  PHOS 5.7*  --   --  4.9* 3.7  < > = values in this interval not displayed. Sepsis Markers  Recent Labs Lab 11/30/2015 0130 11/25/2015  1610 12/05/2015 0909 12/13/15 0420 12/14/15 0412  LATICACIDVEN 5.16* 2.23*  --   --   --   PROCALCITON  --   --  6.55 13.69 5.72   ABG  Recent Labs Lab 12/14/15 1040 12/16/15 0500 12/17/15 0355  PHART 7.356 7.383 7.407  PCO2ART 47.9* 46.2* 48.9*  PO2ART 84.3 87.1 74.0*   Liver Enzymes  Recent Labs Lab 11/22/2015 0102 12/13/15 0420  AST 41  --   ALT 47  --   ALKPHOS 48  --   BILITOT 0.5  --   ALBUMIN 2.5* 2.7*   Cardiac Enzymes No results for input(s): TROPONINI, PROBNP in the last 168 hours. Glucose  Recent Labs Lab 12/16/15 1213 12/16/15 1535 12/16/15 1934 12/16/15 2336 12/17/15 0333 12/17/15 0812  GLUCAP 124* 141* 149* 128* 156* 121*   Imaging Dg Chest Port 1 View  12/17/2015  CLINICAL DATA:  Hypoxia EXAM: PORTABLE CHEST 1 VIEW COMPARISON:  December 16, 2015 and December 13, 2015 FINDINGS: Endotracheal tube tip is 4.6 cm above the carina. Left central catheter tip remains to the left of midline, likely an and intramammary region vein, although at arterial location in the descending  thoracic aorta cannot be excluded on this single frontal view. Nasogastric tube tip and side port are in the stomach. No pneumothorax. There is patchy airspace consolidation in both lung bases. There is a small right pleural effusion. No new opacity. Heart is prominent with pulmonary vascularity within normal limits. No adenopathy. There is evidence of a healing fracture of the posterior right seventh rib. IMPRESSION: Tube and catheter positions are as described in stable. Note that the central catheter on the left overlies the descending thoracic aorta to the left of midline. The location of the tip of this catheter is uncertain. There is persistent patchy airspace consolidation in both lung bases with small right effusion. No new opacity. Stable cardiac prominence. Electronically Signed   By: Bretta Bang III M.D.   On: 12/17/2015 07:30   EVENTS: 3/26 - cardiac arrest 3/27 - high dose versed for seizures 3/27 - ventriculostomy  STUDIES: TTE (10/29/15):  EF 30-35% w/ diffuse hypokinesis. No AS or AR. Mild MR but no MS. RV moderately dilated w/ normal systolic function. LA severely dilated & RA mildly dilated. CT HEAD 2:19AM 3/26:  R Thalamic hemorrhage as well as IVH w/ mild mass effect & 2mm right to left midline shift. Possible mild diffuse cerebral edema. Echo 3/27 EF 40%  MICROBIOLOGY: Blood Ctx x2 3/26>>> Urine Ctx 3/26>>> Tracheal Asp Ctx 3/26>>> Influenza PCR 3/26>>> Respiratory Viral Panel PCR 3/26>>> Urine Strep Ag 3/26>>> Urine Legionella Ag 3/26>>> MRSA PCR 3/26:  Negative   ANTIBIOTICS: Vancomycin 3/26>>>3/31 Zosyn 3/26>>>  LINES/TUBES: OETT 7.5 3/26>>> OGT 3/26>>> FOLEY 3/26>>> 3/27 vstomy >>  ASSESSMENT / PLAN:  NEUROLOGIC A:   Right sided ICH - Sub-falcine herniation. Intraventricular Hemorrhage Status epilepticus UDS neg P:   Neurology following. MRI today. RASS goal: 0. D/C all sedation. Keppra & valproate IV. SBP goal  120-140.  PULMONARY A: Acute Hypoxic/ hypercarbic  Respiratory Failure - Secondary to pulmonary edema. Pulmonary Edema - Cardiogenic vs Neurogenic  P:   Full Vent Support. Titrate O2 for sat of 88-92%. Adjust vent for ABG. ABG and CXR in AM. No weaning given mental status.  CARDIOVASCULAR A:  Possible Acute Systolic CHF Exacerbation - EF 30-35% 10/2015. Hypertensive Emergency  P:  Cardene gtt to maintain SBP 120-140, now off. Hold anti-HTN for now.  RENAL A:   Acute  on Chronic Renal Failure - likely hypervolemic/cardiorenal syndrome. Lactic Acidosis - Resolving. Hyponatremia on admit, now on 3% saline  P:   Monitoring UOP with Foley Trending electrolytes & renal function daily Replace electrolytes as indicated. Free water per neuro.  GASTROINTESTINAL A:   No acute issues.  P: Protonix IV daily Continue TFs Colace and mag citrate as ordered.  HEMATOLOGIC A:   Leukocytosis - Sepsis vs Stress Induced.  P:  Trending cell counts daily w/ CBC SCDs  INFECTIOUS A:   Possible CAP Possible UTI Fever overnight without evidence of infection.  P:   Empiric Vancomycin & Zosyn. Procalcitonin per algorithm noted, elevated but dropping.  ENDOCRINE A:   Hyperglycemia - No h/o DM.  P:   Accu-Checks q4hr SSI per algorithm  FAMILY UPDATES:  Brother updated at bedside 3/31, informed of poor prognosis.  Will check MRI today and family meeting in AM.  Full code for now.  The patient is critically ill with multiple organ systems failure and requires high complexity decision making for assessment and support, frequent evaluation and titration of therapies, application of advanced monitoring technologies and extensive interpretation of multiple databases.   Critical Care Time devoted to patient care services described in this note is  35  Minutes. This time reflects time of care of this signee Dr Koren BoundWesam Lindzie Boxx. This critical care time does not reflect procedure time, or  teaching time or supervisory time of PA/NP/Med student/Med Resident etc but could involve care discussion time.  Alyson ReedyWesam G. Olar Santini, M.D. [facility]/Critical Care Medicine. Pager: [phone]. After hours pager: [phone].  12/17/2015, 9:50 AM

## 2015-12-17 NOTE — Progress Notes (Signed)
Pharmacy Antibiotic Note Austin Mccarty is a 50 y.o. male admitted on 11/26/2015 currently on day 6 of Zosyn and vancomycin for empiric coverage of fever in setting of large right sided ICH with intraventricular hemorrhage.  Plan: 1. Spoke with CCM and will stop vancomycin 2. Continue current dose of Zosyn; however, will need to assess LOT   Height: 6\' 1"  (185.4 cm) Weight: 251 lb 8.7 oz (114.1 kg) IBW/kg (Calculated) : 79.9  Temp (24hrs), Avg:97.4 F (36.3 C), Min:97.3 F (36.3 C), Max:98 F (36.7 C)   Recent Labs Lab 12/13/2015 0130  11/25/2015 0445 12/13/15 0420 12/14/15 0412 12/15/15 0430 12/15/15 1515 12/16/15 0415 12/17/15 0406  WBC  --   < >  --  21.2* 14.1* 11.9*  --  9.1 7.8  CREATININE  --   < >  --  2.41* 2.26* 1.78*  --  1.75* 1.36*  LATICACIDVEN 5.16*  --  2.23*  --   --   --   --   --   --   VANCOTROUGH  --   --   --   --   --   --  5*  --   --   < > = values in this interval not displayed.  Estimated Creatinine Clearance: 88.9 mL/min (by C-G formula based on Cr of 1.36).    Allergies  Allergen Reactions  . Ibuprofen Itching and Swelling    Antimicrobials this admission: 3/26 zosyn >>  3/26  Vancomycin >> 3/31   Microbiology results: 3/28: BCx: ngtd  3/26 urine: ngF  3/26 resp: ngF  3/26 MRSA PCR neg  3/26 BCx x2: ngtd   Thank you for allowing pharmacy to be a part of this patient's care.  Sheron NightingaleJames A Salma Walrond 12/17/2015 9:58 AM

## 2015-12-18 ENCOUNTER — Inpatient Hospital Stay (HOSPITAL_COMMUNITY): Payer: Medicaid Other

## 2015-12-18 DIAGNOSIS — R402444 Other coma, without documented Glasgow coma scale score, or with partial score reported, 24 hours or more after hospital admission: Secondary | ICD-10-CM

## 2015-12-18 LAB — BLOOD GAS, ARTERIAL
ACID-BASE EXCESS: 5.5 mmol/L — AB (ref 0.0–2.0)
BICARBONATE: 29.5 meq/L — AB (ref 20.0–24.0)
Drawn by: 236041
FIO2: 0.3
LHR: 20 {breaths}/min
O2 SAT: 94.8 %
PCO2 ART: 43.3 mmHg (ref 35.0–45.0)
PEEP: 5 cmH2O
PH ART: 7.448 (ref 7.350–7.450)
Patient temperature: 98.6
TCO2: 30.8 mmol/L (ref 0–100)
VT: 550 mL
pO2, Arterial: 78.5 mmHg — ABNORMAL LOW (ref 80.0–100.0)

## 2015-12-18 LAB — MAGNESIUM: Magnesium: 2.6 mg/dL — ABNORMAL HIGH (ref 1.7–2.4)

## 2015-12-18 LAB — CBC
HEMATOCRIT: 34.6 % — AB (ref 39.0–52.0)
Hemoglobin: 10.4 g/dL — ABNORMAL LOW (ref 13.0–17.0)
MCH: 21.7 pg — ABNORMAL LOW (ref 26.0–34.0)
MCHC: 30.1 g/dL (ref 30.0–36.0)
MCV: 72.1 fL — AB (ref 78.0–100.0)
Platelets: 166 10*3/uL (ref 150–400)
RBC: 4.8 MIL/uL (ref 4.22–5.81)
RDW: 20 % — ABNORMAL HIGH (ref 11.5–15.5)
WBC: 8.6 10*3/uL (ref 4.0–10.5)

## 2015-12-18 LAB — BASIC METABOLIC PANEL
ANION GAP: 8 (ref 5–15)
BUN: 47 mg/dL — ABNORMAL HIGH (ref 6–20)
CHLORIDE: 120 mmol/L — AB (ref 101–111)
CO2: 29 mmol/L (ref 22–32)
Calcium: 8.8 mg/dL — ABNORMAL LOW (ref 8.9–10.3)
Creatinine, Ser: 2.11 mg/dL — ABNORMAL HIGH (ref 0.61–1.24)
GFR calc Af Amer: 41 mL/min — ABNORMAL LOW (ref >60)
GFR calc non Af Amer: 36 mL/min — ABNORMAL LOW (ref >60)
GLUCOSE: 146 mg/dL — AB (ref 65–99)
POTASSIUM: 3.8 mmol/L (ref 3.5–5.1)
Sodium: 157 mmol/L — ABNORMAL HIGH (ref 135–145)

## 2015-12-18 LAB — GLUCOSE, CAPILLARY
GLUCOSE-CAPILLARY: 117 mg/dL — AB (ref 65–99)
GLUCOSE-CAPILLARY: 139 mg/dL — AB (ref 65–99)
GLUCOSE-CAPILLARY: 140 mg/dL — AB (ref 65–99)
Glucose-Capillary: 152 mg/dL — ABNORMAL HIGH (ref 65–99)
Glucose-Capillary: 139 mg/dL — ABNORMAL HIGH (ref 65–99)

## 2015-12-18 LAB — PHOSPHORUS: Phosphorus: 5 mg/dL — ABNORMAL HIGH (ref 2.5–4.6)

## 2015-12-18 LAB — SODIUM
SODIUM: 156 mmol/L — AB (ref 135–145)
SODIUM: 157 mmol/L — AB (ref 135–145)
Sodium: 156 mmol/L — ABNORMAL HIGH (ref 135–145)
Sodium: 157 mmol/L — ABNORMAL HIGH (ref 135–145)

## 2015-12-18 LAB — VALPROIC ACID LEVEL: Valproic Acid Lvl: 44 ug/mL — ABNORMAL LOW (ref 50.0–100.0)

## 2015-12-18 NOTE — Progress Notes (Signed)
PULMONARY / CRITICAL CARE MEDICINE   Name: Austin Mccarty MRN: [id number] DOB: 1966/01/13    ADMISSION DATE:  12/04/2015  CHIEF COMPLAINT:  PEA arrest  HISTORY OF PRESENT ILLNESS:   50 yo male with h/o HTN, HFrEF, CKD, DCM 2/2 HTN who presented 3/26 from home with loss of consciousness.  Marland Kitchen  He arrived in the ED with sats in the 60's, emergently intubated then PEA arrest x33min then ROSC.  He was found to have a large right sided ICH with intraventricular hemorrhage likely 2/2 HTN.  SUBJECTIVE: Off sedation, no seizure and remains unresponsive.  VITAL SIGNS: Temp:  [98.5 F (36.9 C)-100.2 F (37.9 C)] 99.8 F (37.7 C) (04/01 0400) Pulse Rate:  [76-105] 82 (04/01 0600) Resp:  [17-32] 21 (04/01 0600) BP: (99-169)/(49-84) 146/74 mmHg (04/01 0600) SpO2:  [91 %-100 %] 96 % (04/01 0600) Arterial Line BP: (104-197)/(51-80) 172/72 mmHg (04/01 0600) FiO2 (%):  [30 %] 30 % (04/01 0336) Weight:  [110.9 kg (244 lb 7.8 oz)] 110.9 kg (244 lb 7.8 oz) (04/01 0500) HEMODYNAMICS:   VENTILATOR SETTINGS: Vent Mode:  [-] PRVC FiO2 (%):  [30 %] 30 % Set Rate:  [20 bmp] 20 bmp Vt Set:  [550 mL] 550 mL PEEP:  [5 cmH20] 5 cmH20 Plateau Pressure:  [20 cmH20-31 cmH20] 21 cmH20 INTAKE / OUTPUT:  Intake/Output Summary (Last 24 hours) at 12/18/15 0806 Last data filed at 12/18/15 0600  Gross per 24 hour  Intake   2062 ml  Output   1616 ml  Net    446 ml   PHYSICAL EXAMINATION: General:  Unresponsive. Ventilated, acutely ill.  Unresponsive off sedation. Integument:  Warm & dry. No rash on exposed skin.  HEENT:  No scleral injection or icterus. Endotracheal tube in place.  Cardiovascular:  Regular rate. Normal S1 & S2. No appreciable JVD.  Pulmonary:  Distant breath sounds bilaterally. Symmetric chest wall rise on ventilator. Abdomen: Soft. Normal bowel sounds. Nondistended.  Neurological:  Unresponsive. Pupils equal. No withdrawal to pain in extremities.  LABS:  CBC  Recent Labs Lab  12/16/15 0415 12/17/15 0406 12/18/15 0547  WBC 9.1 7.8 8.6  HGB 11.5* 11.5* 10.4*  HCT 38.8* 39.2 34.6*  PLT 185 161 166   Coag's  Recent Labs Lab 12/11/2015 0438  APTT 32  INR 1.14   BMET  Recent Labs Lab 12/16/15 0415  12/17/15 0406  12/17/15 1607 12/18/15 0006 12/18/15 0547  NA 159*  < > 159*  < > 158* 156* 157*  K 3.8  --  4.2  --   --   --  3.8  CL 123*  --  122*  --   --   --  120*  CO2 26  --  29  --   --   --  29  BUN 27*  --  30*  --   --   --  47*  CREATININE 1.75*  --  1.36*  --   --   --  2.11*  GLUCOSE 144*  --  158*  --   --   --  146*  < > = values in this interval not displayed. Electrolytes  Recent Labs Lab 12/16/15 0415 12/17/15 0406 12/18/15 0547  CALCIUM 9.2 9.3 8.8*  MG 2.5* 2.6* 2.6*  PHOS 4.9* 3.7 5.0*   Sepsis Markers  Recent Labs Lab 11/20/2015 0130 12/06/2015 0445 11/26/2015 0909 12/13/15 0420 12/14/15 0412  LATICACIDVEN 5.16* 2.23*  --   --   --   PROCALCITON  --   --  6.55 13.69 5.72   ABG  Recent Labs Lab 12/16/15 0500 12/17/15 0355 12/18/15 0340  PHART 7.383 7.407 7.448  PCO2ART 46.2* 48.9* 43.3  PO2ART 87.1 74.0* 78.5*   Liver Enzymes  Recent Labs Lab 02/27/2016 0102 12/13/15 0420  AST 41  --   ALT 47  --   ALKPHOS 48  --   BILITOT 0.5  --   ALBUMIN 2.5* 2.7*   Cardiac Enzymes No results for input(s): TROPONINI, PROBNP in the last 168 hours. Glucose  Recent Labs Lab 12/17/15 0812 12/17/15 1137 12/17/15 1541 12/17/15 2040 12/17/15 2340 12/18/15 0325  GLUCAP 121* 129* 140* 134* 139* 117*   Imaging Mr [facility]Mra Head Wo Contrast  12/17/2015  CLINICAL DATA:  Anoxic brain injury. Intra cerebral hemorrhage. Cardiac arrest. EXAM: MRI HEAD WITHOUT CONTRAST MRA HEAD WITHOUT CONTRAST TECHNIQUE: Multiplanar, multiecho pulse sequences of the brain and surrounding structures were obtained without intravenous contrast. Angiographic images of the head were obtained using MRA technique without contrast. COMPARISON:  Head CT  from 2 days ago FINDINGS: MRI HEAD FINDINGS Calvarium and upper cervical spine: Unremarkable right frontal burr hole for ventriculostomy. Orbits: Negative. Sinuses and Mastoids: Diffuse sinusitis with marked mucosal thickening and scattered fluid levels. Bilateral mastoid effusion. Brain: Right thalamic hematoma has similar dimensions to prior head CT, measuring up to 36 mm. Peripheral T1 appearance is compatible with methemoglobin. Stable casting of the lateral ventricles with clot. Ventriculostomy from the right with tip in the low right frontal horn of the lateral ventricle. Stable dimensions of the lateral ventricles, with preferential drainage on the right. Vasogenic edema surrounds the hematoma. Symmetric diffusion and T2 hyperintense cortex, especially along the posterior frontal cortex and occipital lobes. There is speckled restricted diffusion in the caudate nuclei. Pattern compatible with history of global anoxic injury. No discrete vascular territory infarct. No shift or herniation. Swelling is symmetric. MRA HEAD FINDINGS Symmetric carotid and vertebral arteries. Intracranial flow is present. No indication of vascular malformation or aneurysm as cause of the parenchymal hemorrhage. No major branch occlusion or flow limiting stenosis. IMPRESSION: 1. Bilateral cortical edema/infarction and small caudate infarcts compatible with global anoxic injury. 2. Size stable right thalamic and intraventricular hemorrhage. No MRA finding to explain the hematoma. 3. Right frontal shunt with stable ventricular volume and preferential right lateral ventricle drainage. 4. Progressive sinusitis and mastoiditis. Electronically Signed   By: Marnee SpringJonathon  Watts M.D.   On: 12/17/2015 15:24   Mr Brain Wo Contrast  12/17/2015  CLINICAL DATA:  Anoxic brain injury. Intra cerebral hemorrhage. Cardiac arrest. EXAM: MRI HEAD WITHOUT CONTRAST MRA HEAD WITHOUT CONTRAST TECHNIQUE: Multiplanar, multiecho pulse sequences of the brain and  surrounding structures were obtained without intravenous contrast. Angiographic images of the head were obtained using MRA technique without contrast. COMPARISON:  Head CT from 2 days ago FINDINGS: MRI HEAD FINDINGS Calvarium and upper cervical spine: Unremarkable right frontal burr hole for ventriculostomy. Orbits: Negative. Sinuses and Mastoids: Diffuse sinusitis with marked mucosal thickening and scattered fluid levels. Bilateral mastoid effusion. Brain: Right thalamic hematoma has similar dimensions to prior head CT, measuring up to 36 mm. Peripheral T1 appearance is compatible with methemoglobin. Stable casting of the lateral ventricles with clot. Ventriculostomy from the right with tip in the low right frontal horn of the lateral ventricle. Stable dimensions of the lateral ventricles, with preferential drainage on the right. Vasogenic edema surrounds the hematoma. Symmetric diffusion and T2 hyperintense cortex, especially along the posterior frontal cortex and occipital lobes. There is speckled restricted diffusion  in the caudate nuclei. Pattern compatible with history of global anoxic injury. No discrete vascular territory infarct. No shift or herniation. Swelling is symmetric. MRA HEAD FINDINGS Symmetric carotid and vertebral arteries. Intracranial flow is present. No indication of vascular malformation or aneurysm as cause of the parenchymal hemorrhage. No major branch occlusion or flow limiting stenosis. IMPRESSION: 1. Bilateral cortical edema/infarction and small caudate infarcts compatible with global anoxic injury. 2. Size stable right thalamic and intraventricular hemorrhage. No MRA finding to explain the hematoma. 3. Right frontal shunt with stable ventricular volume and preferential right lateral ventricle drainage. 4. Progressive sinusitis and mastoiditis. Electronically Signed   By: Marnee Spring M.D.   On: 12/17/2015 15:24   Dg Chest Port 1 View  12/18/2015  CLINICAL DATA:  Acute respiratory  failure. On ventilator. Intra cerebral hemorrhage. EXAM: PORTABLE CHEST 1 VIEW COMPARISON:  12/17/2015 FINDINGS: Endotracheal tube and nasogastric tube remain in appropriate position. Left jugular central venous catheter tip remains to the left of midline, and is of uncertain location. This could be within an intramammary region vein, although arterial location in the descending thoracic aorta cannot definitely be excluded. No pneumothorax visualized. Patient is rotated to the right. Bilateral lower lung airspace disease again seen, left side greater than right, suspicious for asymmetric edema although infection cannot be excluded. Heart size is within normal limits. Old right posterior seventh rib fracture deformity again noted. IMPRESSION: Stable abnormal location of left jugular central venous catheter, with tip lying to the left of midline, possibly within an internal mammary region vein or arterial location the descending thoracic aorta. Clinical correlation is recommended. Bibasilar airspace disease, without significant interval change. Electronically Signed   By: Myles Rosenthal M.D.   On: 12/18/2015 07:25   EVENTS: 3/26 - cardiac arrest 3/27 - high dose versed for seizures 3/27 - ventriculostomy  STUDIES: TTE (10/29/15):  EF 30-35% w/ diffuse hypokinesis. No AS or AR. Mild MR but no MS. RV moderately dilated w/ normal systolic function. LA severely dilated & RA mildly dilated. CT HEAD 2:19AM 3/26:  R Thalamic hemorrhage as well as IVH w/ mild mass effect & 2mm right to left midline shift. Possible mild diffuse cerebral edema. Echo 3/27 EF 40% MRI 3/31>>>Diffuse brain damage.  MICROBIOLOGY: Blood Ctx x2 3/26>>> Urine Ctx 3/26>>> Tracheal Asp Ctx 3/26>>> Influenza PCR 3/26>>> Respiratory Viral Panel PCR 3/26>>> Urine Strep Ag 3/26>>> Urine Legionella Ag 3/26>>> MRSA PCR 3/26:  Negative   ANTIBIOTICS: Vancomycin 3/26>>>3/31 Zosyn 3/26>>>  LINES/TUBES: OETT 7.5 3/26>>> OGT 3/26>>> FOLEY  3/26>>> 3/27 vstomy >>  ASSESSMENT / PLAN:  NEUROLOGIC A:   Right sided ICH - Sub-falcine herniation. Intraventricular Hemorrhage Status epilepticus UDS neg P:   Neurology following. MRI diffuse brain injury, anoxic. RASS goal: 0. D/C all sedation. Keppra & valproate IV. SBP goal 120-140.  PULMONARY A: Acute Hypoxic/ hypercarbic  Respiratory Failure - Secondary to pulmonary edema. Pulmonary Edema - Cardiogenic vs Neurogenic  P:   Full Vent Support. Titrate O2 for sat of 88-92%. Adjust vent for ABG. ABG and CXR in AM. No weaning given mental status.  CARDIOVASCULAR A:  Possible Acute Systolic CHF Exacerbation - EF 30-35% 10/2015. Hypertensive Emergency  P:  Cardene gtt to maintain SBP 120-140, now off. Hold anti-HTN for now.  RENAL A:   Acute on Chronic Renal Failure - likely hypervolemic/cardiorenal syndrome. Lactic Acidosis - Resolving. Hyponatremia on admit, now on 3% saline  P:   Monitoring UOP with Foley Trending electrolytes & renal function daily Replace electrolytes  as indicated. Free water per neuro.  GASTROINTESTINAL A:   No acute issues.  P: Protonix IV daily Continue TFs Colace and mag citrate as ordered.  HEMATOLOGIC A:   Leukocytosis - Sepsis vs Stress Induced.  P:  Trending cell counts daily w/ CBC SCDs  INFECTIOUS A:   Possible CAP Possible UTI Fever overnight without evidence of infection.  P:   Empiric Vancomycin & Zosyn. Procalcitonin per algorithm noted, elevated but dropping.  ENDOCRINE A:   Hyperglycemia - No h/o DM.  P:   Accu-Checks q4hr SSI per algorithm  FAMILY UPDATES:  Spoke with brother, they inquired about trach/peg, I strongly discouraged it and requested a family meeting.  Brother is to arrange.  The patient is critically ill with multiple organ systems failure and requires high complexity decision making for assessment and support, frequent evaluation and titration of therapies, application of  advanced monitoring technologies and extensive interpretation of multiple databases.   Critical Care Time devoted to patient care services described in this note is  45  Minutes. This time reflects time of care of this signee Dr Koren Bound. This critical care time does not reflect procedure time, or teaching time or supervisory time of PA/NP/Med student/Med Resident etc but could involve care discussion time.  Alyson Reedy, M.D. [facility] Pulmonary/Critical Care Medicine. Pager: [phone]. After hours pager: [phone].  12/18/2015, 8:06 AM

## 2015-12-18 NOTE — Progress Notes (Addendum)
STROKE TEAM PROGRESS NOTE   SUBJECTIVE (INTERVAL HISTORY) His brother is at the bedside. EVD drainage patent. Remains unresponsive and comatose. has been off sedation for 3 days now.  not following commands or respond to pain stimulation. MRI shows bilateral cortical edema/infarction and small caudate infarcts compatible with global anoxic injury  OBJECTIVE Temp:  [98.5 F (36.9 C)-100.2 F (37.9 C)] 100 F (37.8 C) (04/01 0800) Pulse Rate:  [65-105] 65 (04/01 1000) Cardiac Rhythm:  [-] Normal sinus rhythm (04/01 0800) Resp:  [19-32] 20 (04/01 1000) BP: (99-180)/(49-84) 140/71 mmHg (04/01 1000) SpO2:  [92 %-100 %] 97 % (04/01 1000) Arterial Line BP: (104-235)/(51-93) 166/71 mmHg (04/01 1000) FiO2 (%):  [30 %] 30 % (04/01 0826) Weight:  [244 lb 7.8 oz (110.9 kg)] 244 lb 7.8 oz (110.9 kg) (04/01 0500)  CBC:   Recent Labs Lab 11/24/2015 0102  12/13/15 0420  12/17/15 0406 12/18/15 0547  WBC 17.0*  < > 21.2*  < > 7.8 8.6  NEUTROABS 9.1*  --  19.2*  --   --   --   HGB 13.0  < > 12.1*  < > 11.5* 10.4*  HCT 40.1  < > 38.0*  < > 39.2 34.6*  MCV 69.7*  < > 67.9*  < > 73.0* 72.1*  PLT 289  < > 232  < > 161 166  < > = values in this interval not displayed.  Basic Metabolic Panel:  Recent Labs Lab 12/17/15 0406  12/18/15 0547 12/18/15 1020  NA 159*  < > 157* 156*  K 4.2  --  3.8  --   CL 122*  --  120*  --   CO2 29  --  29  --   GLUCOSE 158*  --  146*  --   BUN 30*  --  47*  --   CREATININE 1.36*  --  2.11*  --   CALCIUM 9.3  --  8.8*  --   MG 2.6*  --  2.6*  --   PHOS 3.7  --  5.0*  --   < > = values in this interval not displayed.  Lipid Panel:     Component Value Date/Time   CHOL 109 10/29/2015 0420   TRIG 101 12/16/2015 0415   HDL 34* 10/29/2015 0420   CHOLHDL 3.2 10/29/2015 0420   VLDL 12 10/29/2015 0420   LDLCALC 63 10/29/2015 0420   HgbA1c:  Lab Results  Component Value Date   HGBA1C 5.9* 12/01/2015   Urine Drug Screen:     Component Value Date/Time    LABOPIA NONE DETECTED 12/06/2015 0959   COCAINSCRNUR NONE DETECTED 12/06/2015 0959   LABBENZ NONE DETECTED 11/20/2015 0959   AMPHETMU NONE DETECTED 12/11/2015 0959   THCU NONE DETECTED 12/06/2015 0959   LABBARB NONE DETECTED 11/25/2015 0959      IMAGING I have personally reviewed the radiological images below and agree with the radiology interpretations.  Ct Head Wo Contrast 11/20/2015  Right thalamic hemorrhage as well as intraventricular hemorrhages with mild mass effect and a 2 mm right-to-left midline shift. Diffuse effacement of the sulci with possible mild diffuse cerebral edema.   12/13/2015  IMPRESSION: 1. Stable size of right basal ganglia hemorrhage. 2. Slight increase in midline shift, now measuring 7 mm. 3. Increased prominence of the temporal horns of the lateral ventricles compatible with developing hydrocephalus.   12/15/2015  IMPRESSION: 1. Unchanged size of right thalamic hemorrhage. Decreased size of the ventricles following interval ventriculostomy catheter placement. Slightly  decreased volume of intraventricular hemorrhage. 2. Decreased gray-white differentiation and diffuse sulcal effacement concerning for diffuse edema.   Dg Chest Portable 1 View 12/17/2015   Appliances appear to be in satisfactory location. Diffuse airspace disease throughout both lungs.   12/15/2015  IMPRESSION: 1. Left IJ line tip noted in unchanged position to the left of midline. Reference is made to prior chest x-ray report of 12/13/2015. Again intra-arterial location of the left IJ line cannot be excluded. This finding was reported verbally to the patient's caregiver on 12/13/2015. Endotracheal tube and NG tube in stable position. 2. Cardiomegaly with persistent bilateral pulmonary infiltrates/pulmonary edema. Similar findings noted on prior exam.   EEG 12/02/2015 Abnormal EEG in the comatose state demonstrating: 1. Burst suppression pattern (1-10 seconds of burst activity with up to 10-30 seconds  of suppression). 2. During the burst activity, there are frequent right frontal-temporal poly-spike and spike-and-wave discharges. These are associated with myoclonic jerks on video recording.  LTM EEG  12/13/15 This EEG is consistent with diffuse non-specific cerebral dysfunction, with epileptiform discharges in the right hemisphere that improve though the duration of the recording. No electrographic seizures were seen. 12/14/15 This EEG is consistent with diffuse non-specific cerebral dysfunction, with suggestion of cortical irritability in the right hemisphere. No electrographic seizures were seen.  TTE - - Left ventricle: The cavity size was moderately dilated. Wall  thickness was normal. The estimated ejection fraction was 40%.  Diffuse hypokinesis.  Renal artery ultrasound - - Bilateral abnormal intrarenal resistive indices. - No evidence of right renal artery stenosis. - No evidence of left renal artery stenosis.  Mri and Mra Head Wo Contrast 12/17/2015  IMPRESSION: 1. Bilateral cortical edema/infarction and small caudate infarcts compatible with global anoxic injury. 2. Size stable right thalamic and intraventricular hemorrhage. No MRA finding to explain the hematoma. 3. Right frontal shunt with stable ventricular volume and preferential right lateral ventricle drainage. 4. Progressive sinusitis and mastoiditis.    PHYSICAL EXAMONATION  Temp:  [98.5 F (36.9 C)-100.2 F (37.9 C)] 100 F (37.8 C) (04/01 0800) Pulse Rate:  [65-105] 65 (04/01 1000) Resp:  [19-32] 20 (04/01 1000) BP: (99-180)/(49-84) 140/71 mmHg (04/01 1000) SpO2:  [92 %-100 %] 97 % (04/01 1000) Arterial Line BP: (104-235)/(51-93) 166/71 mmHg (04/01 1000) FiO2 (%):  [30 %] 30 % (04/01 0826) Weight:  [244 lb 7.8 oz (110.9 kg)] 244 lb 7.8 oz (110.9 kg) (04/01 0500)  General - Well nourished, well developed, intubated and off sedation.  Ophthalmologic - Fundi not visualized due to ET  positioning.  Cardiovascular - Regular rhythm and rate.  Neuro - intubated and off sedation, not respond to voice. No pupillary reflexes, no dolls eyes, but weak corneal bilaterally right more than left, with very weak gag. Not withdraw to pain in all extremities. DTR diminished and no babinski. Sensation, coordination and gait not tested. Not breathing over the vent.   ASSESSMENT/PLAN Mr. Austin Mccarty is a 50 y.o. male with history of chronic kidney disease, community-acquired pneumonia, and hypertension, congestive heart failure, presenting with confusion, dysarthria, and subsequent loss of consciousness. Patient required intubation and went into PEA arrest for 15 min with resuscitation. Found to have a right BG ICH with IVH.  He did not receive IV t-PA due to hemorrhage.  Global anoxic brain injury due to prolonged cardiac arrest  Cardiac arrest for 15min  MRI consistent with global anoxic brain injury  EEG myoclonic status -> flat lines  Neuro exam showed weak corneal and gag, no  papillary or dolls eyes, not breathing over the vent  Poor prognosis  I had long discussion with brother at bedside regarding the current diagnosis and prognosis including persistent vegetative state. He would like to have further discussion with family members.  ICH:  right BG ICH with ventricular extension.  CT showed right BG ICH with IVH  Repeat CT head showed stable hematoma but obstructive hydrocephalus  Repeat CT again showed stable hematoma and decreased hydrocephalus after EVD  2D Echo EF 40%, improved from prior.  LDL 60 on 10/29/2015  HgbA1c 5.9  VTE prophylaxis - SCDs and heparin subq Diet NPO time specified  No antithrombotic prior to admission, now on No antithrombotic secondary to hemorrhage.  Ongoing aggressive stroke risk factor management  Disposition - pending  Obstructive hydrocephalus  CT repeat showed above findings  EVD placed by NSG  Repeat CT showed  decreased hydrocephalus  MRI showed stable ventricle size  Seizure  Initial stat EEG burst suppression with myoclonic status  Put on versed drip high dose - weaning off  Put on keppra and depakote  LTM EEG so far showed no seizure but slowing -> flat lines  Maximize keppra to  bid  depakote load again and increase to  Q8  depakote level 33->48->69->61->51  Continue LTM EEG flat lines with versed weaning off - currently off  Cerebral edema  CT repeat showed increase midline shift  D/c mannitol  3% saline on hold  Na Q6h 149->154->157->160->157->159  Na goal 150-155  On free water  Cardiac arrest  Likely related to IVH down to IV ventricle  Resuscitated  Burst suppression on EEG, resolved on versed -> slowing -> flat lines  NSR now  respiratory failure and aspiration pneumonia  Intubated   CCM on board  On vanco and zosyn   Leukocytosis 17.0->21.2->14.1->11.9->9.1->7.8  Hypertension  Managed with Cardene PRN  Developed with hypotension and on Neo, now off  BP goal < 160  Resumed home meds coreg, hydralazine, imdur, and cozaar  Increased hydralazine to  tid  Increase clonidine to 0.3 mg tid  Fever, resolved  UA negative  BCx NGTD  Discuss with NSG about CSF sampling, but unlikely ventriculitis  CXR stable  On vanco and zosyn  Central fever possible  Other Stroke Risk Factors  Cigarette smoker, quit smoking 8 years ago.  Obesity, Body mass index is 32.26 kg/(m^2).   Other Active Problems  Cardiomyopathy EF 40%   Chronic kidney disease, Cre 2.41->2.26->1.78 ->1.36  Comatose state s/p hypoxic injury from PEA cardiac arrest  Hospital day # 6  This patient is critically ill due to right ICH and IVH, hydrocephalus, seizure, cardiac arrest, cerebral edema and at significant risk of neurological worsening, death form recurrent bleeding, brain herniation, heart failure, cardiac arrest, hydrocephalus. This  patient's care requires constant monitoring of vital signs, hemodynamics, respiratory and cardiac monitoring, review of multiple databases, neurological assessment, discussion with family, other specialists and medical decision making of high complexity. I spent 35 minutes of neurocritical care time in the care of this patient. I had long discussion with brother at bedside regarding the current diagnosis and prognosis including persistent vegetative state. He awaits further discussion with family members.  Delia Heady, MD Stroke Neurology 12/18/2015 11:38 AM    To contact Stroke Continuity provider, please refer to [email or web address]. After hours, contact General Neurology

## 2015-12-18 NOTE — Progress Notes (Signed)
EVD draining

## 2015-12-18 DEATH — deceased

## 2015-12-19 ENCOUNTER — Inpatient Hospital Stay (HOSPITAL_COMMUNITY): Payer: Medicaid Other

## 2015-12-19 LAB — BASIC METABOLIC PANEL
Anion gap: 10 (ref 5–15)
BUN: 42 mg/dL — ABNORMAL HIGH (ref 6–20)
CALCIUM: 8.8 mg/dL — AB (ref 8.9–10.3)
CO2: 27 mmol/L (ref 22–32)
Chloride: 118 mmol/L — ABNORMAL HIGH (ref 101–111)
Creatinine, Ser: 1.59 mg/dL — ABNORMAL HIGH (ref 0.61–1.24)
GFR calc Af Amer: 58 mL/min — ABNORMAL LOW (ref >60)
GFR, EST NON AFRICAN AMERICAN: 50 mL/min — AB (ref >60)
GLUCOSE: 160 mg/dL — AB (ref 65–99)
POTASSIUM: 3.8 mmol/L (ref 3.5–5.1)
SODIUM: 155 mmol/L — AB (ref 135–145)

## 2015-12-19 LAB — BLOOD GAS, ARTERIAL
Acid-Base Excess: 5.6 mmol/L — ABNORMAL HIGH (ref 0.0–2.0)
BICARBONATE: 29.4 meq/L — AB (ref 20.0–24.0)
DRAWN BY: 23604
FIO2: 0.3
LHR: 20 {breaths}/min
MECHVT: 550 mL
O2 SAT: 95.4 %
PATIENT TEMPERATURE: 98.6
PCO2 ART: 41.8 mmHg (ref 35.0–45.0)
PEEP: 5 cmH2O
PO2 ART: 84.5 mmHg (ref 80.0–100.0)
TCO2: 30.7 mmol/L (ref 0–100)
pH, Arterial: 7.462 — ABNORMAL HIGH (ref 7.350–7.450)

## 2015-12-19 LAB — CULTURE, BLOOD (ROUTINE X 2)
CULTURE: NO GROWTH
CULTURE: NO GROWTH

## 2015-12-19 LAB — CBC
HCT: 35 % — ABNORMAL LOW (ref 39.0–52.0)
Hemoglobin: 10.4 g/dL — ABNORMAL LOW (ref 13.0–17.0)
MCH: 21.5 pg — AB (ref 26.0–34.0)
MCHC: 29.7 g/dL — ABNORMAL LOW (ref 30.0–36.0)
MCV: 72.5 fL — ABNORMAL LOW (ref 78.0–100.0)
PLATELETS: 177 10*3/uL (ref 150–400)
RBC: 4.83 MIL/uL (ref 4.22–5.81)
RDW: 20 % — AB (ref 11.5–15.5)
WBC: 8.4 10*3/uL (ref 4.0–10.5)

## 2015-12-19 LAB — GLUCOSE, CAPILLARY
GLUCOSE-CAPILLARY: 106 mg/dL — AB (ref 65–99)
GLUCOSE-CAPILLARY: 135 mg/dL — AB (ref 65–99)
GLUCOSE-CAPILLARY: 137 mg/dL — AB (ref 65–99)
Glucose-Capillary: 126 mg/dL — ABNORMAL HIGH (ref 65–99)
Glucose-Capillary: 128 mg/dL — ABNORMAL HIGH (ref 65–99)
Glucose-Capillary: 126 mg/dL — ABNORMAL HIGH (ref 65–99)
Glucose-Capillary: 111 mg/dL — ABNORMAL HIGH (ref 65–99)

## 2015-12-19 LAB — SODIUM
SODIUM: 156 mmol/L — AB (ref 135–145)
SODIUM: 155 mmol/L — AB (ref 135–145)
SODIUM: 156 mmol/L — AB (ref 135–145)

## 2015-12-19 LAB — VALPROIC ACID LEVEL: Valproic Acid Lvl: 47 ug/mL — ABNORMAL LOW (ref 50.0–100.0)

## 2015-12-19 LAB — PHOSPHORUS: Phosphorus: 4.1 mg/dL (ref 2.5–4.6)

## 2015-12-19 LAB — MAGNESIUM: MAGNESIUM: 2.5 mg/dL — AB (ref 1.7–2.4)

## 2015-12-19 MED ORDER — LABETALOL HCL 5 MG/ML IV SOLN
10.0000 mg | INTRAVENOUS | Status: DC | PRN
  Administered 2015-12-19: 20 mg via INTRAVENOUS
  Administered 2015-12-19 (×2): 10 mg via INTRAVENOUS
  Administered 2015-12-19 – 2015-12-25 (×8): 20 mg via INTRAVENOUS
  Administered 2015-12-25: 10 mg via INTRAVENOUS
  Administered 2015-12-25 – 2015-12-26 (×3): 20 mg via INTRAVENOUS
  Filled 2015-12-19 (×14): qty 4

## 2015-12-19 NOTE — Progress Notes (Signed)
EVD draining 

## 2015-12-19 NOTE — Progress Notes (Signed)
eLink Physician-Brief Progress Note Patient Name: Austin Mccarty DOB: 12/15/1965 MRN: [id number]   Date of Service  12/19/2015  HPI/Events of Note  Nurse notified of subcutaneous heparin order.  Patient with right thalamic and intraventricular hemorrhage.  eICU Interventions  DC subcutaneous heparin     Intervention Category Major Interventions: Hemorrhage - evaluation and management  Lawanda CousinsJennings Hieu Herms 12/19/2015, 5:14 AM

## 2015-12-19 NOTE — Progress Notes (Signed)
PULMONARY / CRITICAL CARE MEDICINE   Name: Austin Mccarty MRN: [id number] DOB: 1966/02/24    ADMISSION DATE:  12/16/2015  CHIEF COMPLAINT:  PEA arrest  HISTORY OF PRESENT ILLNESS:   50 yo male with h/o HTN, HFrEF, CKD, DCM 2/2 HTN who presented 3/26 from home with loss of consciousness.  Marland Kitchen  He arrived in the ED with sats in the 60's, emergently intubated then PEA arrest x38min then ROSC.  He was found to have a large right sided ICH with intraventricular hemorrhage likely 2/2 HTN.  SUBJECTIVE: Off sedation, no seizure and remains unresponsive.  VITAL SIGNS: Temp:  [98.5 F (36.9 C)-98.9 F (37.2 C)] 98.5 F (36.9 C) (04/02 0400) Pulse Rate:  [61-80] 63 (04/02 0818) Resp:  [18-29] 20 (04/02 0818) BP: (126-182)/(66-90) 169/66 mmHg (04/02 0818) SpO2:  [95 %-100 %] 100 % (04/02 0818) Arterial Line BP: (139-221)/(61-87) 170/67 mmHg (04/02 0815) FiO2 (%):  [30 %] 30 % (04/02 0818) Weight:  [110.5 kg (243 lb 9.7 oz)] 110.5 kg (243 lb 9.7 oz) (04/02 0430) HEMODYNAMICS:   VENTILATOR SETTINGS: Vent Mode:  [-] PRVC FiO2 (%):  [30 %] 30 % Set Rate:  [20 bmp] 20 bmp Vt Set:  [550 mL] 550 mL PEEP:  [5 cmH20] 5 cmH20 Plateau Pressure:  [13 cmH20-22 cmH20] 22 cmH20 INTAKE / OUTPUT:  Intake/Output Summary (Last 24 hours) at 12/19/15 0834 Last data filed at 12/19/15 0800  Gross per 24 hour  Intake 2167.5 ml  Output   2350 ml  Net -182.5 ml   PHYSICAL EXAMINATION: General:  Unresponsive. Ventilated, acutely ill.  Unresponsive off sedation. Integument:  Warm & dry. No rash on exposed skin.  HEENT:  No scleral injection or icterus. Endotracheal tube in place.  Cardiovascular:  Regular rate. Normal S1 & S2. No appreciable JVD.  Pulmonary:  Distant breath sounds bilaterally. Symmetric chest wall rise on ventilator. Abdomen: Soft. Normal bowel sounds. Nondistended.  Neurological:  Unresponsive. Pupils equal. No withdrawal to pain in extremities.  LABS:  CBC  Recent Labs Lab  12/17/15 0406 12/18/15 0547 12/19/15 0440  WBC 7.8 8.6 8.4  HGB 11.5* 10.4* 10.4*  HCT 39.2 34.6* 35.0*  PLT 161 166 177   Coag's No results for input(s): APTT, INR in the last 168 hours. BMET  Recent Labs Lab 12/17/15 0406  12/18/15 0547  12/18/15 1738 12/18/15 2209 12/19/15 0440  NA 159*  < > 157*  < > 157* 157* 155*  K 4.2  --  3.8  --   --   --  3.8  CL 122*  --  120*  --   --   --  118*  CO2 29  --  29  --   --   --  27  BUN 30*  --  47*  --   --   --  42*  CREATININE 1.36*  --  2.11*  --   --   --  1.59*  GLUCOSE 158*  --  146*  --   --   --  160*  < > = values in this interval not displayed. Electrolytes  Recent Labs Lab 12/17/15 0406 12/18/15 0547 12/19/15 0440  CALCIUM 9.3 8.8* 8.8*  MG 2.6* 2.6* 2.5*  PHOS 3.7 5.0* 4.1   Sepsis Markers  Recent Labs Lab 12/17/2015 0909 12/13/15 0420 12/14/15 0412  PROCALCITON 6.55 13.69 5.72   ABG  Recent Labs Lab 12/17/15 0355 12/18/15 0340 12/19/15 0425  PHART 7.407 7.448 7.462*  PCO2ART 48.9* 43.3  41.8  PO2ART 74.0* 78.5* 84.5   Liver Enzymes  Recent Labs Lab 12/13/15 0420  ALBUMIN 2.7*   Cardiac Enzymes No results for input(s): TROPONINI, PROBNP in the last 168 hours. Glucose  Recent Labs Lab 12/18/15 1124 12/18/15 1550 12/18/15 1937 12/18/15 2320 12/19/15 0333 12/19/15 0750  GLUCAP 152* 139* 126* 128* 126* 106*   Imaging Dg Chest Port 1 View  12/19/2015  CLINICAL DATA:  Respiratory failure.  Intracranial hemorrhage. EXAM: PORTABLE CHEST 1 VIEW COMPARISON:  12/18/2015 and prior exams FINDINGS: An endotracheal tube with tip 5.5 cm above the carina and NG tube entering the stomach again noted. A left vascular catheter originating in the left neck with tip terminating to the left of midline at the level of the carina noted -correlate clinically. Cardiomegaly again identified. This is a low volume film with bibasilar atelectasis/airspace again noted. There is no evidence of pneumothorax.  IMPRESSION: Little significant change with bibasilar atelectasis/ airspace disease and cardiomegaly again noted. Left-sided vascular catheter as described-correlate clinically. Electronically Signed   By: Harmon Pier M.D.   On: 12/19/2015 08:01   EVENTS: 3/26 - cardiac arrest 3/27 - high dose versed for seizures 3/27 - ventriculostomy  STUDIES: TTE (10/29/15):  EF 30-35% w/ diffuse hypokinesis. No AS or AR. Mild MR but no MS. RV moderately dilated w/ normal systolic function. LA severely dilated & RA mildly dilated. CT HEAD 2:19AM 3/26:  R Thalamic hemorrhage as well as IVH w/ mild mass effect & 2mm right to left midline shift. Possible mild diffuse cerebral edema. Echo 3/27 EF 40% MRI 3/31>>>Diffuse brain damage.  MICROBIOLOGY: Blood Ctx x2 3/26>>> Urine Ctx 3/26>>> Tracheal Asp Ctx 3/26>>> Influenza PCR 3/26>>> Respiratory Viral Panel PCR 3/26>>> Urine Strep Ag 3/26>>> Urine Legionella Ag 3/26>>> MRSA PCR 3/26:  Negative   ANTIBIOTICS: Vancomycin 3/26>>>3/31 Zosyn 3/26>>>  LINES/TUBES: OETT 7.5 3/26>>> OGT 3/26>>> FOLEY 3/26>>> 3/27 vstomy >>  ASSESSMENT / PLAN:  NEUROLOGIC A:   Right sided ICH - Sub-falcine herniation. Intraventricular Hemorrhage Status epilepticus UDS neg P:   Neurology following. MRI diffuse brain injury, anoxic. RASS goal: 0. D/C all sedation. Keppra & valproate IV. SBP goal 120-140.  PULMONARY A: Acute Hypoxic/ hypercarbic  Respiratory Failure - Secondary to pulmonary edema. Pulmonary Edema - Cardiogenic vs Neurogenic  P:   Full Vent Support. Titrate O2 for sat of 88-92%. Adjust vent for ABG. ABG and CXR in AM. No weaning given mental status.  CARDIOVASCULAR A:  Possible Acute Systolic CHF Exacerbation - EF 30-35% 10/2015. Hypertensive Emergency  P:  Cardene gtt to maintain SBP 120-140, now off. Hold anti-HTN for now.  RENAL A:   Acute on Chronic Renal Failure - likely hypervolemic/cardiorenal syndrome. Lactic Acidosis -  Resolving. Hyponatremia on admit, now on 3% saline  P:   Monitoring UOP with Foley. Trending electrolytes & renal function daily. Replace electrolytes as indicated. Free water per neuro.  GASTROINTESTINAL A:   No acute issues.  P: Protonix IV daily. Continue TFs. Colace and mag citrate as ordered.  HEMATOLOGIC A:   Leukocytosis - Sepsis vs Stress Induced.  P:  Trending cell counts daily w/ CBC. SCDs.  INFECTIOUS A:   Possible CAP Possible UTI Fever overnight without evidence of infection.  P:   Empiric Zosyn. Procalcitonin per algorithm noted, elevated but dropping.  ENDOCRINE A:   Hyperglycemia - No h/o DM.  P:   Accu-Checks q4hr. SSI per algorithm.  FAMILY UPDATES:  No family bedside this AM.  The patient is critically ill  with multiple organ systems failure and requires high complexity decision making for assessment and support, frequent evaluation and titration of therapies, application of advanced monitoring technologies and extensive interpretation of multiple databases.   Critical Care Time devoted to patient care services described in this note is  45  Minutes. This time reflects time of care of this signee Dr Koren BoundWesam Yacoub. This critical care time does not reflect procedure time, or teaching time or supervisory time of PA/NP/Med student/Med Resident etc but could involve care discussion time.  Alyson ReedyWesam G. Yacoub, M.D. [facility]. Pager: [phone]. After hours pager: [phone].  12/19/2015, 8:34 AM

## 2015-12-19 NOTE — Progress Notes (Signed)
STROKE TEAM PROGRESS NOTE   SUBJECTIVE (INTERVAL HISTORY) His brother is not at the bedside this morning. EVD drainage patent. Remains unresponsive and comatose. has been off sedation for 3 days now.  not following commands or respond to pain stimulation. MRI shows bilateral cortical edema/infarction and small caudate infarcts compatible with global anoxic injury  OBJECTIVE Temp:  [98.5 F (36.9 C)-98.9 F (37.2 C)] 98.5 F (36.9 C) (04/02 0400) Pulse Rate:  [61-80] 62 (04/02 0800) Cardiac Rhythm:  [-] Normal sinus rhythm (04/02 0700) Resp:  [18-29] 20 (04/02 0800) BP: (126-182)/(66-90) 148/69 mmHg (04/02 0800) SpO2:  [95 %-100 %] 100 % (04/02 0800) Arterial Line BP: (139-221)/(61-87) 171/67 mmHg (04/02 0800) FiO2 (%):  [30 %] 30 % (04/02 0413) Weight:  [110.5 kg (243 lb 9.7 oz)] 110.5 kg (243 lb 9.7 oz) (04/02 0430)  CBC:   Recent Labs Lab 12/13/15 0420  12/18/15 0547 12/19/15 0440  WBC 21.2*  < > 8.6 8.4  NEUTROABS 19.2*  --   --   --   HGB 12.1*  < > 10.4* 10.4*  HCT 38.0*  < > 34.6* 35.0*  MCV 67.9*  < > 72.1* 72.5*  PLT 232  < > 166 177  < > = values in this interval not displayed.  Basic Metabolic Panel:  Recent Labs Lab 12/18/15 0547  12/18/15 2209 12/19/15 0440  NA 157*  < > 157* 155*  K 3.8  --   --  3.8  CL 120*  --   --  118*  CO2 29  --   --  27  GLUCOSE 146*  --   --  160*  BUN 47*  --   --  42*  CREATININE 2.11*  --   --  1.59*  CALCIUM 8.8*  --   --  8.8*  MG 2.6*  --   --  2.5*  PHOS 5.0*  --   --  4.1  < > = values in this interval not displayed.  Lipid Panel:     Component Value Date/Time   CHOL 109 10/29/2015 0420   TRIG 101 12/16/2015 0415   HDL 34* 10/29/2015 0420   CHOLHDL 3.2 10/29/2015 0420   VLDL 12 10/29/2015 0420   LDLCALC 63 10/29/2015 0420   HgbA1c:  Lab Results  Component Value Date   HGBA1C 5.9* 11/28/2015   Urine Drug Screen:     Component Value Date/Time   LABOPIA NONE DETECTED 12/06/2015 0959   COCAINSCRNUR NONE  DETECTED 11/21/2015 0959   LABBENZ NONE DETECTED 12/17/2015 0959   AMPHETMU NONE DETECTED 11/29/2015 0959   THCU NONE DETECTED 11/28/2015 0959   LABBARB NONE DETECTED 12/08/2015 0959      IMAGING I have personally reviewed the radiological images below and agree with the radiology interpretations.  Ct Head Wo Contrast 11/18/2015   Right thalamic hemorrhage as well as intraventricular hemorrhages with mild mass effect and a 2 mm right-to-left midline shift. Diffuse effacement of the sulci with possible mild diffuse cerebral edema.    12/13/2015  1. Stable size of right basal ganglia hemorrhage.  2. Slight increase in midline shift, now measuring 7 mm.  3. Increased prominence of the temporal horns of the lateral ventricles compatible with developing hydrocephalus.    12/15/2015   1. Unchanged size of right thalamic hemorrhage. Decreased size of the ventricles following interval ventriculostomy catheter placement. Slightly decreased volume of intraventricular hemorrhage.  2. Decreased gray-white differentiation and diffuse sulcal effacement concerning for diffuse edema.  Dg Chest Portable 1 View 12/07/2015   Appliances appear to be in satisfactory location. Diffuse airspace disease throughout both lungs.   12/15/2015   1. Left IJ line tip noted in unchanged position to the left of midline. Reference is made to prior chest x-ray report of 12/13/2015. Again intra-arterial location of the left IJ line cannot be excluded. This finding was reported verbally to the patient's caregiver on 12/13/2015. Endotracheal tube and NG tube in stable position.  2. Cardiomegaly with persistent bilateral pulmonary infiltrates/pulmonary edema. Similar findings noted on prior exam.   12/19/2015 Little significant change with bibasilar atelectasis/ airspace disease and cardiomegaly again noted. Left-sided vascular catheter as described-correlate clinically.   EEG 12/05/2015 Abnormal EEG in the comatose  state demonstrating: 1. Burst suppression pattern (1-10 seconds of burst activity with up to 10-30 seconds of suppression). 2. During the burst activity, there are frequent right frontal-temporal poly-spike and spike-and-wave discharges. These are associated with myoclonic jerks on video recording.   LTM EEG  12/13/15 This EEG is consistent with diffuse non-specific cerebral dysfunction, with epileptiform discharges in the right hemisphere that improve though the duration of the recording. No electrographic seizures were seen. 12/14/15 This EEG is consistent with diffuse non-specific cerebral dysfunction, with suggestion of cortical irritability in the right hemisphere. No electrographic seizures were seen.   TTE - - Left ventricle: The cavity size was moderately dilated. Wall  thickness was normal. The estimated ejection fraction was 40%.  Diffuse hypokinesis.   Renal artery ultrasound - - Bilateral abnormal intrarenal resistive indices. - No evidence of right renal artery stenosis. - No evidence of left renal artery stenosis.   Mri and Mra Head Wo Contrast 12/17/2015   1. Bilateral cortical edema/infarction and small caudate infarcts compatible with global anoxic injury.  2. Size stable right thalamic and intraventricular hemorrhage. No MRA finding to explain the hematoma.  3. Right frontal shunt with stable ventricular volume and preferential right lateral ventricle drainage.  4. Progressive sinusitis and mastoiditis.     PHYSICAL EXAMINATION  Temp:  [98.5 F (36.9 C)-98.9 F (37.2 C)] 98.5 F (36.9 C) (04/02 0400) Pulse Rate:  [61-80] 62 (04/02 0800) Resp:  [18-29] 20 (04/02 0800) BP: (126-182)/(66-90) 148/69 mmHg (04/02 0800) SpO2:  [95 %-100 %] 100 % (04/02 0800) Arterial Line BP: (139-221)/(61-87) 171/67 mmHg (04/02 0800) FiO2 (%):  [30 %] 30 % (04/02 0413) Weight:  [110.5 kg (243 lb 9.7 oz)] 110.5 kg (243 lb 9.7 oz) (04/02 0430)  General - Well nourished, well  developed, intubated and off sedation.  Ophthalmologic - Fundi not visualized due to ET positioning.  Cardiovascular - Regular rhythm and rate.  Neuro - intubated and off sedation, does not respond to voice or sternal rub. No pupillary reflexes, no dolls eyes, weak corneal bilaterally, with very weak gag. Not withdraw to pain in all extremities. DTR diminished and no babinski. Sensation, coordination and gait not tested. Not breathing over the vent.   ASSESSMENT/PLAN Austin Mccarty is a 50 y.o. male with history of chronic kidney disease, community-acquired pneumonia, and hypertension, congestive heart failure, presenting with confusion, dysarthria, and subsequent loss of consciousness. Patient required intubation and went into PEA arrest for 15 min with resuscitation. Found to have a right BG ICH with IVH.  He did not receive IV t-PA due to hemorrhage.  Global anoxic brain injury due to prolonged cardiac arrest  Cardiac arrest for 15min  MRI consistent with global anoxic brain injury  EEG myoclonic status -> flat  lines  Neuro exam showed weak corneal and gag, no papillary or dolls eyes, not breathing over the vent  Poor prognosis  Dr. Pearlean Brownie had long discussion with brother at bedside regarding the current diagnosis and prognosis including persistent vegetative state. He would like to have further discussion with family members.   ICH:  right BG ICH with ventricular extension.  CT showed right BG ICH with IVH  Repeat CT head showed stable hematoma but obstructive hydrocephalus  Repeat CT again showed stable hematoma and decreased hydrocephalus after EVD  2D Echo EF 40%, improved from prior.  LDL 60 on 10/29/2015  HgbA1c 5.9  VTE prophylaxis - SCDs and heparin subq Diet NPO time specified  No antithrombotic prior to admission, now on No antithrombotic secondary to hemorrhage.  Ongoing aggressive stroke risk factor management  Disposition - pending  Obstructive  hydrocephalus  CT repeat showed above findings  EVD placed by NSG  Repeat CT showed decreased hydrocephalus  MRI showed stable ventricle size  Seizure  Initial stat EEG burst suppression with myoclonic status  Put on versed drip high dose - weaning off  Put on keppra and depakote  LTM EEG so far showed no seizure but slowing -> flat lines  Maximize keppra to 1500mg  bid  depakote load again and increase to 1250mg  Q8  depakote level 33->48->69->61->51  PRN Ativan  Continue LTM EEG flat lines with versed weaning off - currently off  Cerebral edema  CT repeat showed increase midline shift  D/c mannitol  3% saline on hold  Na Q6h 149->154->157->160->157->159->155 (Sunday)  Na goal 150-155  On free water 200 ml Q4 hrs  Cardiac arrest  Likely related to IVH down to IV ventricle  Resuscitated  Burst suppression on EEG, resolved on versed -> slowing -> flat lines  NSR now  respiratory failure and aspiration pneumonia  Intubated   CCM on board  On vanco and zosyn   Leukocytosis 17.0->21.2->14.1->11.9->9.1->7.8-> 8.4 (Sunday)  Hypertension  Managed with Cardene PRN  Developed with hypotension and on Neo, now off  BP goal < 160  Resumed home meds coreg, clonidine, hydralazine, and cozaar  Increased hydralazine to 100mg  tid  Increase clonidine to 0.3 mg tid  PRN Labetalol  Fever, resolved  UA negative  BCx NGTD  Urine Cx - no growth  Discuss with NSG about CSF sampling, but unlikely ventriculitis  CXR stable 12/19/2015  On Zosyn (Vanco D/C'd)  Central fever possible  Other Stroke Risk Factors  Cigarette smoker, quit smoking 8 years ago.  Obesity, Body mass index is 32.15 kg/(m^2).   Other Active Problems  Cardiomyopathy EF 40%   Chronic kidney disease, Cre 2.41->2.26->1.78 ->1.36-> 1.59 (Sunday)  Comatose state s/p hypoxic injury from PEA cardiac arrest  Anemia    (Hb - 10.4)    (Hct - 35) (Sunday)  Calcium - 8.8      Mg  - 2.5  Hospital day # 7  This patient is critically ill due to right ICH and IVH, hydrocephalus, seizure, cardiac arrest, cerebral edema and at significant risk of neurological worsening, death form recurrent bleeding, brain herniation, heart failure, cardiac arrest, hydrocephalus. This patient's care requires constant monitoring of vital signs, hemodynamics, respiratory and cardiac monitoring, review of multiple databases, neurological assessment, discussion with family, other specialists and medical decision making of high complexity. I spent 30 minutes of neurocritical care time in the care of this patient. Dr. Pearlean Brownie  had long discussion with brother at bedside ysterday regarding the current diagnosis and  prognosis including persistent vegetative state. He awaits further discussion with family members.   Personally examined patient and images, and have participated in and made any corrections needed to history, physical, neuro exam,assessment and plan as stated above.  I have personally obtained the history, evaluated lab date, reviewed imaging studies and agree with radiology interpretations.    Austin Dean, MD Stroke Neurology [phone] [facility]      To contact Stroke Continuity provider, please refer to [email or web address]. After hours, contact General Neurology

## 2015-12-20 LAB — ALDOSTERONE + RENIN ACTIVITY W/ RATIO
ALDO / PRA Ratio: 4 (ref 0.0–30.0)
Aldosterone: 1.7 ng/dL (ref 0.0–30.0)
PRA LC/MS/MS: 0.429 ng/mL/h (ref 0.167–5.380)

## 2015-12-20 LAB — GLUCOSE, CAPILLARY
GLUCOSE-CAPILLARY: 119 mg/dL — AB (ref 65–99)
GLUCOSE-CAPILLARY: 121 mg/dL — AB (ref 65–99)
GLUCOSE-CAPILLARY: 124 mg/dL — AB (ref 65–99)
Glucose-Capillary: 110 mg/dL — ABNORMAL HIGH (ref 65–99)
Glucose-Capillary: 121 mg/dL — ABNORMAL HIGH (ref 65–99)
Glucose-Capillary: 142 mg/dL — ABNORMAL HIGH (ref 65–99)
Glucose-Capillary: 138 mg/dL — ABNORMAL HIGH (ref 65–99)

## 2015-12-20 LAB — VALPROIC ACID LEVEL: VALPROIC ACID LVL: 48 ug/mL — AB (ref 50.0–100.0)

## 2015-12-20 LAB — SODIUM
SODIUM: 156 mmol/L — AB (ref 135–145)
SODIUM: 154 mmol/L — AB (ref 135–145)
SODIUM: 152 mmol/L — AB (ref 135–145)
Sodium: 156 mmol/L — ABNORMAL HIGH (ref 135–145)

## 2015-12-20 MED ORDER — FENTANYL CITRATE (PF) 100 MCG/2ML IJ SOLN
25.0000 ug | INTRAMUSCULAR | Status: DC | PRN
  Administered 2015-12-20 – 2015-12-24 (×14): 100 ug via INTRAVENOUS
  Filled 2015-12-20 (×16): qty 2

## 2015-12-20 NOTE — Progress Notes (Signed)
STROKE TEAM PROGRESS NOTE   SUBJECTIVE (INTERVAL HISTORY) His brother is at the bedside this morning. EVD drainage patent. Remains unresponsive and comatose. has been off sedation for 4 days now.  not following commands or respond to pain stimulation.Patient`s wife and family in [place] do not want to withdraw support OBJECTIVE Temp:  [97.8 F (36.6 C)-99 F (37.2 C)] 98.7 F (37.1 C) (04/03 1200) Pulse Rate:  [63-87] 87 (04/03 1228) Cardiac Rhythm:  [-] Normal sinus rhythm (04/02 2000) Resp:  [0-31] 23 (04/03 1228) BP: (119-183)/(67-91) 173/86 mmHg (04/03 1228) SpO2:  [96 %-100 %] 97 % (04/03 1228) Arterial Line BP: (125-212)/(54-77) 191/73 mmHg (04/03 1200) FiO2 (%):  [30 %] 30 % (04/03 1228) Weight:  [242 lb 15.2 oz (110.2 kg)] 242 lb 15.2 oz (110.2 kg) (04/03 0339)  CBC:   Recent Labs Lab 12/18/15 0547 12/19/15 0440  WBC 8.6 8.4  HGB 10.4* 10.4*  HCT 34.6* 35.0*  MCV 72.1* 72.5*  PLT 166 177    Basic Metabolic Panel:  Recent Labs Lab 12/18/15 0547  12/19/15 0440  12/20/15 0410 12/20/15 1014  NA 157*  < > 155*  < > 156* 156*  K 3.8  --  3.8  --   --   --   CL 120*  --  118*  --   --   --   CO2 29  --  27  --   --   --   GLUCOSE 146*  --  160*  --   --   --   BUN 47*  --  42*  --   --   --   CREATININE 2.11*  --  1.59*  --   --   --   CALCIUM 8.8*  --  8.8*  --   --   --   MG 2.6*  --  2.5*  --   --   --   PHOS 5.0*  --  4.1  --   --   --   < > = values in this interval not displayed.  Lipid Panel:     Component Value Date/Time   CHOL 109 10/29/2015 0420   TRIG 101 12/16/2015 0415   HDL 34* 10/29/2015 0420   CHOLHDL 3.2 10/29/2015 0420   VLDL 12 10/29/2015 0420   LDLCALC 63 10/29/2015 0420   HgbA1c:  Lab Results  Component Value Date   HGBA1C 5.9* 11/18/2015   Urine Drug Screen:     Component Value Date/Time   LABOPIA NONE DETECTED 11/18/2015 0959   COCAINSCRNUR NONE DETECTED 12/07/2015 0959   LABBENZ NONE DETECTED 12/15/2015 0959   AMPHETMU NONE  DETECTED 12/05/2015 0959   THCU NONE DETECTED 11/28/2015 0959   LABBARB NONE DETECTED 11/20/2015 0959      IMAGING I have personally reviewed the radiological images below and agree with the radiology interpretations.  Ct Head Wo Contrast 12/02/2015   Right thalamic hemorrhage as well as intraventricular hemorrhages with mild mass effect and a 2 mm right-to-left midline shift. Diffuse effacement of the sulci with possible mild diffuse cerebral edema.    12/13/2015  1. Stable size of right basal ganglia hemorrhage.  2. Slight increase in midline shift, now measuring 7 mm.  3. Increased prominence of the temporal horns of the lateral ventricles compatible with developing hydrocephalus.    12/15/2015   1. Unchanged size of right thalamic hemorrhage. Decreased size of the ventricles following interval ventriculostomy catheter placement. Slightly decreased volume of intraventricular hemorrhage.  2.  Decreased gray-white differentiation and diffuse sulcal effacement concerning for diffuse edema.    Dg Chest Portable 1 View 12/11/2015   Appliances appear to be in satisfactory location. Diffuse airspace disease throughout both lungs.   12/15/2015   1. Left IJ line tip noted in unchanged position to the left of midline. Reference is made to prior chest x-ray report of 12/13/2015. Again intra-arterial location of the left IJ line cannot be excluded. This finding was reported verbally to the patient's caregiver on 12/13/2015. Endotracheal tube and NG tube in stable position.  2. Cardiomegaly with persistent bilateral pulmonary infiltrates/pulmonary edema. Similar findings noted on prior exam.   12/19/2015 Little significant change with bibasilar atelectasis/ airspace disease and cardiomegaly again noted. Left-sided vascular catheter as described-correlate clinically.   EEG 11/28/2015 Abnormal EEG in the comatose state demonstrating: 1. Burst suppression pattern (1-10 seconds of burst activity  with up to 10-30 seconds of suppression). 2. During the burst activity, there are frequent right frontal-temporal poly-spike and spike-and-wave discharges. These are associated with myoclonic jerks on video recording.   LTM EEG  12/13/15 This EEG is consistent with diffuse non-specific cerebral dysfunction, with epileptiform discharges in the right hemisphere that improve though the duration of the recording. No electrographic seizures were seen. 12/14/15 This EEG is consistent with diffuse non-specific cerebral dysfunction, with suggestion of cortical irritability in the right hemisphere. No electrographic seizures were seen.   TTE - - Left ventricle: The cavity size was moderately dilated. Wall  thickness was normal. The estimated ejection fraction was 40%.  Diffuse hypokinesis.   Renal artery ultrasound - - Bilateral abnormal intrarenal resistive indices. - No evidence of right renal artery stenosis. - No evidence of left renal artery stenosis.   Mri and Mra Head Wo Contrast 12/17/2015   1. Bilateral cortical edema/infarction and small caudate infarcts compatible with global anoxic injury.  2. Size stable right thalamic and intraventricular hemorrhage. No MRA finding to explain the hematoma.  3. Right frontal shunt with stable ventricular volume and preferential right lateral ventricle drainage.  4. Progressive sinusitis and mastoiditis.     PHYSICAL EXAMINATION  Temp:  [97.8 F (36.6 C)-99 F (37.2 C)] 98.7 F (37.1 C) (04/03 1200) Pulse Rate:  [63-87] 87 (04/03 1228) Resp:  [0-31] 23 (04/03 1228) BP: (119-183)/(67-91) 173/86 mmHg (04/03 1228) SpO2:  [96 %-100 %] 97 % (04/03 1228) Arterial Line BP: (125-212)/(54-77) 191/73 mmHg (04/03 1200) FiO2 (%):  [30 %] 30 % (04/03 1228) Weight:  [242 lb 15.2 oz (110.2 kg)] 242 lb 15.2 oz (110.2 kg) (04/03 0339)  General - Well nourished, well developed, intubated and off sedation.  Ophthalmologic - Fundi not visualized due to ET  positioning.  Cardiovascular - Regular rhythm and rate.  Neuro - intubated and off sedation, does not respond to voice or sternal rub. No pupillary reflexes, no dolls eyes, weak corneal bilaterally, with very weak gag. Not withdraw to pain in all extremities. DTR diminished and no babinski. Sensation, coordination and gait not tested. Not breathing over the vent.   ASSESSMENT/PLAN Mr. Austin Mccarty is a 50 y.o. male with history of chronic kidney disease, community-acquired pneumonia, and hypertension, congestive heart failure, presenting with confusion, dysarthria, and subsequent loss of consciousness. Patient required intubation and went into PEA arrest for 15 min with resuscitation. Found to have a right BG ICH with IVH.  He did not receive IV t-PA due to hemorrhage.  Comatose state due to Global anoxic brain injury due to prolonged cardiac arrest  Cardiac arrest for 15min  MRI consistent with global anoxic brain injury  EEG myoclonic status -> flat lines  Neuro exam showed weak corneal and gag, no papillary or dolls eyes, not breathing over the vent  Poor prognosis  Dr. Pearlean BrownieSethi had long discussion with brother at bedside regarding the current diagnosis and prognosis including persistent vegetative state. He would like to have further discussion with family members.   ICH:  right BG ICH with ventricular extension.  CT showed right BG ICH with IVH  Repeat CT head showed stable hematoma but obstructive hydrocephalus  Repeat CT again showed stable hematoma and decreased hydrocephalus after EVD  2D Echo EF 40%, improved from prior.  LDL 60 on 10/29/2015  HgbA1c 5.9  VTE prophylaxis - SCDs and heparin subq Diet NPO time specified  No antithrombotic prior to admission, now on No antithrombotic secondary to hemorrhage.  Ongoing aggressive stroke risk factor management  Disposition - pending  Obstructive hydrocephalus  CT repeat showed above findings  EVD placed by  NSG  Repeat CT showed decreased hydrocephalus  MRI showed stable ventricle size  Seizure  Initial stat EEG burst suppression with myoclonic status  Put on versed drip high dose - weaning off  Put on keppra and depakote  LTM EEG so far showed no seizure but slowing -> flat lines  Maximize keppra to 1500mg  bid  depakote load again and increase to 1250mg  Q8  depakote level 33->48->69->61->51  PRN Ativan  Continue LTM EEG flat lines with versed weaning off - currently off  Cerebral edema  CT repeat showed increase midline shift  D/c mannitol  3% saline on hold  Na Q6h 149->154->157->160->157->159->155 (Sunday)  Na goal 150-155  On free water 200 ml Q4 hrs  Cardiac arrest  Likely related to IVH down to IV ventricle  Resuscitated  Burst suppression on EEG, resolved on versed -> slowing -> flat lines  NSR now  respiratory failure and aspiration pneumonia  Intubated   CCM on board  On vanco and zosyn   Leukocytosis 17.0->21.2->14.1->11.9->9.1->7.8-> 8.4 (Sunday)  Hypertension  Managed with Cardene PRN  Developed with hypotension and on Neo, now off  BP goal < 160  Resumed home meds coreg, clonidine, hydralazine, and cozaar  Increased hydralazine to 100mg  tid  Increase clonidine to 0.3 mg tid  PRN Labetalol  Fever, resolved  UA negative  BCx NGTD  Urine Cx - no growth  Discuss with NSG about CSF sampling, but unlikely ventriculitis  CXR stable 12/19/2015  On Zosyn (Vanco D/C'd)  Central fever possible  Other Stroke Risk Factors  Cigarette smoker, quit smoking 8 years ago.  Obesity, Body mass index is 32.06 kg/(m^2).   Other Active Problems  Cardiomyopathy EF 40%   Chronic kidney disease, Cre 2.41->2.26->1.78 ->1.36-> 1.59 (Sunday)  Comatose state s/p hypoxic injury from PEA cardiac arrest  Anemia    (Hb - 10.4)    (Hct - 35) (Sunday)  Calcium - 8.8      Mg - 2.5  Hospital day # 8  This patient is critically ill due  to right ICH and IVH, hydrocephalus, seizure, cardiac arrest, cerebral edema and at significant risk of neurological worsening, death form recurrent bleeding, brain herniation, heart failure, cardiac arrest, hydrocephalus. This patient's care requires constant monitoring of vital signs, hemodynamics, respiratory and cardiac monitoring, review of multiple databases, neurological assessment, discussion with family, other specialists and medical decision making of high complexity. I spent 40 minutes of neurocritical care time in the care of  this patient.I had long discussion with brother at bedside  regarding the current diagnosis and prognosis including persistent vegetative state. He  understands patient's poor prognosis and need to stop ventilatory support however he states the patient's wife and family background do not understand this and did not want to withdraw life support at the present time. I explained to him that the patient is likely within a tracheostomy, PEG tube and prolonged ventilatory support which may not be possible in ER and patient may need to move out of state to a facility which may not be convenient for the family to visit. He understands this and will communicate this to the family. D/w Dr Molli Knock  Personally examined patient and images, and have participated in and made any corrections needed to history, physical, neuro exam,assessment and plan as stated above.  I have personally obtained the history, evaluated lab date, reviewed imaging studies and agree with radiology interpretations.    Delia Heady MD Stroke Neurology     To contact Stroke Continuity provider, please refer to [email or web address]. After hours, contact General Neurology

## 2015-12-20 NOTE — Clinical Social Work Note (Signed)
CSW received voicemail from patient's brother requesting assistance with getting chaplain/"pastor" to meet with the family. CSW has made unit aware.  Austin Mccarty MSW, [place] Beach[place]CS[place], [place], [phone]

## 2015-12-20 NOTE — Progress Notes (Signed)
PULMONARY / CRITICAL CARE MEDICINE   Name: Austin Mccarty MRN: [id number] DOB: 10/22/1965    ADMISSION DATE:  11/25/2015  CHIEF COMPLAINT:  PEA arrest  HISTORY OF PRESENT ILLNESS:   50 yo male with h/o HTN, HFrEF, CKD, DCM 2/2 HTN who presented 3/26 from home with loss of consciousness.  Marland Kitchen.  He arrived in the ED with sats in the 60's, emergently intubated then PEA arrest x4715min then ROSC.  He was found to have a large right sided ICH with intraventricular hemorrhage likely 2/2 HTN.  SUBJECTIVE: Off sedation, no seizure and remains unresponsive.  VITAL SIGNS: Temp:  [97.8 F (36.6 C)-99 F (37.2 C)] 99 F (37.2 C) (04/03 0807) Pulse Rate:  [60-83] 83 (04/03 0900) Resp:  [0-31] 20 (04/03 0900) BP: (119-172)/(66-86) 151/80 mmHg (04/03 0900) SpO2:  [96 %-100 %] 98 % (04/03 0900) Arterial Line BP: (125-197)/(54-79) 184/67 mmHg (04/03 0900) FiO2 (%):  [30 %] 30 % (04/03 0900) Weight:  [110.2 kg (242 lb 15.2 oz)] 110.2 kg (242 lb 15.2 oz) (04/03 0339) HEMODYNAMICS:   VENTILATOR SETTINGS: Vent Mode:  [-] PRVC FiO2 (%):  [30 %] 30 % Set Rate:  [20 bmp] 20 bmp Vt Set:  [550 mL] 550 mL PEEP:  [5 cmH20] 5 cmH20 Plateau Pressure:  [21 cmH20-25 cmH20] 21 cmH20 INTAKE / OUTPUT:  Intake/Output Summary (Last 24 hours) at 12/20/15 [id number] Last data filed at 12/20/15 0900  Gross per 24 hour  Intake 2167.5 ml  Output   2415 ml  Net -247.5 ml   PHYSICAL EXAMINATION: General:  Unresponsive. Ventilated, acutely ill.  Unresponsive off sedation. Integument:  Warm & dry. No rash on exposed skin.  HEENT:  No scleral injection or icterus. Endotracheal tube in place.  Cardiovascular:  Regular rate. Normal S1 & S2. No appreciable JVD.  Pulmonary:  Distant breath sounds bilaterally. Symmetric chest wall rise on ventilator. Abdomen: Soft. Normal bowel sounds. Nondistended.  Neurological:  Unresponsive. Pupils equal. No withdrawal to pain in extremities.  LABS:  CBC  Recent Labs Lab  12/17/15 0406 12/18/15 0547 12/19/15 0440  WBC 7.8 8.6 8.4  HGB 11.5* 10.4* 10.4*  HCT 39.2 34.6* 35.0*  PLT 161 166 177   Coag's No results for input(s): APTT, INR in the last 168 hours. BMET  Recent Labs Lab 12/17/15 0406  12/18/15 0547  12/19/15 0440  12/19/15 1635 12/19/15 2130 12/20/15 0410  NA 159*  < > 157*  < > 155*  < > 155* 156* 156*  K 4.2  --  3.8  --  3.8  --   --   --   --   CL 122*  --  120*  --  118*  --   --   --   --   CO2 29  --  29  --  27  --   --   --   --   BUN 30*  --  47*  --  42*  --   --   --   --   CREATININE 1.36*  --  2.11*  --  1.59*  --   --   --   --   GLUCOSE 158*  --  146*  --  160*  --   --   --   --   < > = values in this interval not displayed. Electrolytes  Recent Labs Lab 12/17/15 0406 12/18/15 0547 12/19/15 0440  CALCIUM 9.3 8.8* 8.8*  MG 2.6* 2.6* 2.5*  PHOS 3.7  5.0* 4.1   Sepsis Markers  Recent Labs Lab 12/14/15 0412  PROCALCITON 5.72   ABG  Recent Labs Lab 12/17/15 0355 12/18/15 0340 12/19/15 0425  PHART 7.407 7.448 7.462*  PCO2ART 48.9* 43.3 41.8  PO2ART 74.0* 78.5* 84.5   Liver Enzymes No results for input(s): AST, ALT, ALKPHOS, BILITOT, ALBUMIN in the last 168 hours. Cardiac Enzymes No results for input(s): TROPONINI, PROBNP in the last 168 hours. Glucose  Recent Labs Lab 12/19/15 1130 12/19/15 1602 12/19/15 2008 12/19/15 2352 12/20/15 0339 12/20/15 0809  GLUCAP 111* 135* 137* 121* 110* 121*   Imaging No results found. EVENTS: 3/26 - cardiac arrest 3/27 - high dose versed for seizures 3/27 - ventriculostomy  STUDIES: TTE (10/29/15):  EF 30-35% w/ diffuse hypokinesis. No AS or AR. Mild MR but no MS. RV moderately dilated w/ normal systolic function. LA severely dilated & RA mildly dilated. CT HEAD 2:19AM 3/26:  R Thalamic hemorrhage as well as IVH w/ mild mass effect & 2mm right to left midline shift. Possible mild diffuse cerebral edema. Echo 3/27 EF 40% MRI 3/31>>>Diffuse brain  damage.  MICROBIOLOGY: Blood Ctx x2 3/26>>> Urine Ctx 3/26>>> Tracheal Asp Ctx 3/26>>> Influenza PCR 3/26>>> Respiratory Viral Panel PCR 3/26>>> Urine Strep Ag 3/26>>> Urine Legionella Ag 3/26>>> MRSA PCR 3/26:  Negative   ANTIBIOTICS: Vancomycin 3/26>>>3/31 Zosyn 3/26>>>  LINES/TUBES: OETT 7.5 3/26>>> OGT 3/26>>> FOLEY 3/26>>> 3/27 vstomy >>  ASSESSMENT / PLAN:  NEUROLOGIC A:   Right sided ICH - Sub-falcine herniation. Intraventricular Hemorrhage Status epilepticus UDS neg P:   Neurology following. MRI diffuse brain injury, anoxic. D/Ced all sedation. Keppra & valproate IV. SBP goal 120-140.  PULMONARY A: Acute Hypoxic/ hypercarbic  Respiratory Failure - Secondary to pulmonary edema. Pulmonary Edema - Cardiogenic vs Neurogenic  P:   Full Vent Support, failed weaning with high RR and low Tv. Titrate O2 for sat of 88-92%. Adjust vent for ABG. ABG and CXR in AM. No weaning given mental status.  CARDIOVASCULAR A:  Possible Acute Systolic CHF Exacerbation - EF 30-35% 10/2015. Hypertensive Emergency  P:  Cardene gtt to maintain SBP 120-140, now off. Hold anti-HTN for now.  RENAL A:   Acute on Chronic Renal Failure - likely hypervolemic/cardiorenal syndrome. Lactic Acidosis - Resolving. Hyponatremia on admit, now on 3% saline  P:   Monitoring UOP with Foley. Trending electrolytes & renal function daily. Replace electrolytes as indicated. Free water per neuro.  GASTROINTESTINAL A:   No acute issues.  P: Protonix IV daily. Continue TFs. Colace and mag citrate as ordered.  HEMATOLOGIC A:   Leukocytosis - Sepsis vs Stress Induced.  P:  Trending cell counts daily w/ CBC. SCDs.  INFECTIOUS A:   Possible CAP Possible UTI Fever overnight without evidence of infection.  P:   Empiric Zosyn. Procalcitonin per algorithm noted, elevated but dropping.  ENDOCRINE A:   Hyperglycemia - No h/o DM.  P:   Accu-Checks q4hr. SSI per  algorithm.  FAMILY UPDATES:  Brother updated bedside, he does not wish for trach/peg but wife insistent on that and the family in [place] is as well.  He is unable to convince them otherwise.  He is concerned that withdrawal will result in a violent situation back home.  I informed him that with trach/peg and need of vent will result in patient being sent to [place], [place] or [place].  He will be inform the wife and let us know.  I offered the option of ethics committee involvement but he will discuss  with the wife first.  The patient is critically ill with multiple organ systems failure and requires high complexity decision making for assessment and support, frequent evaluation and titration of therapies, application of advanced monitoring technologies and extensive interpretation of multiple databases.   Critical Care Time devoted to patient care services described in this note is  45  Minutes. This time reflects time of care of this signee Dr Koren Bound. This critical care time does not reflect procedure time, or teaching time or supervisory time of PA/NP/Med student/Med Resident etc but could involve care discussion time.  Alyson Reedy, M.D. [facility] Pulmonary/Critical Care Medicine. Pager: [phone]. After hours pager: [phone].  12/20/2015, 9:22 AM

## 2015-12-20 NOTE — Progress Notes (Signed)
Patient ID: Austin Mccarty, male   DOB: 09/22/1965, 50 y.o.   MRN: [id number] BP 140/75 mmHg  Pulse 80  Temp(Src) 97.9 F (36.6 C) (Oral)  Resp 15  Ht 6\' 1"  (1.854 m)  Wt 110.2 kg (242 lb 15.2 oz)  BMI 32.06 kg/m2  SpO2 98% Comatose, drain working well.  No recommendations

## 2015-12-20 NOTE — Clinical Social Work Note (Signed)
Patient's brother came to CSW office requesting to speak with a CSW. CSW spoke with patient's brother regarding patient's possible discharge needs. The brother is able to reiterate the conversation had with MD. CSW explained to the brother that if the patient requires long term vent SNF placement at discharge, the facility would be out of state. The brother does not want this to happen because the family would not be able to visit the patient. It seems that the brother does not want to move forward with trach and PEG, but the patient's wife wants these interventions. CSW explained to the brother that this would have to be a decision the family makes. CSW asked that the brother speak with the wife again regarding their options, as this information may impact the family's decisions moving forward. The brother plans to speak with the patient's wife and states he may request CSW's assistance with talking to wife about out of state placement if the wife still insists on trach and PEG. CSW will continue to follow.    Roddie McBryant Zela Sobieski MSW, [place]CSW, [place], [phone]

## 2015-12-20 NOTE — Progress Notes (Signed)
Upon assessment pt noted to start posturing in upper extremities. Witnessed by couple other RN's. Notified MD pf the  new finding. Per MD would expect to see this assessment and said that he may fluctuate. No further orders at this time

## 2015-12-20 NOTE — Progress Notes (Signed)
eLink Physician-Brief Progress Note Patient Name: Maudry DiegoMohialdin H. Loewenstein DOB: 01/31/1966 MRN: [id number]   Date of Service  12/20/2015  HPI/Events of Note  Vent dyschrony  eICU Interventions  fent to q1h prn     Intervention Category Minor Interventions: Routine modifications to care plan (e.g. PRN medications for pain, fever)  Nelda BucksFEINSTEIN,Amilcar Reever J. 12/20/2015, 2:13 AM

## 2015-12-21 ENCOUNTER — Inpatient Hospital Stay (HOSPITAL_COMMUNITY): Payer: Medicaid Other

## 2015-12-21 DIAGNOSIS — G931 Anoxic brain damage, not elsewhere classified: Secondary | ICD-10-CM | POA: Insufficient documentation

## 2015-12-21 DIAGNOSIS — Z515 Encounter for palliative care: Secondary | ICD-10-CM

## 2015-12-21 DIAGNOSIS — R4182 Altered mental status, unspecified: Secondary | ICD-10-CM

## 2015-12-21 LAB — BLOOD GAS, ARTERIAL
ACID-BASE EXCESS: 4.6 mmol/L — AB (ref 0.0–2.0)
Bicarbonate: 28.6 mEq/L — ABNORMAL HIGH (ref 20.0–24.0)
DRAWN BY: 449561
FIO2: 0.3
LHR: 20 {breaths}/min
O2 SAT: 97 %
PATIENT TEMPERATURE: 98.2
PEEP/CPAP: 5 cmH2O
PH ART: 7.446 (ref 7.350–7.450)
TCO2: 29.9 mmol/L (ref 0–100)
VT: 550 mL
pCO2 arterial: 42.1 mmHg (ref 35.0–45.0)
pO2, Arterial: 92.9 mmHg (ref 80.0–100.0)

## 2015-12-21 LAB — CBC
HEMATOCRIT: 33.4 % — AB (ref 39.0–52.0)
Hemoglobin: 10.5 g/dL — ABNORMAL LOW (ref 13.0–17.0)
MCH: 22.9 pg — AB (ref 26.0–34.0)
MCHC: 31.4 g/dL (ref 30.0–36.0)
MCV: 72.8 fL — ABNORMAL LOW (ref 78.0–100.0)
Platelets: 175 10*3/uL (ref 150–400)
RBC: 4.59 MIL/uL (ref 4.22–5.81)
RDW: 20.5 % — AB (ref 11.5–15.5)
WBC: 8.6 10*3/uL (ref 4.0–10.5)

## 2015-12-21 LAB — GLUCOSE, CAPILLARY
GLUCOSE-CAPILLARY: 123 mg/dL — AB (ref 65–99)
GLUCOSE-CAPILLARY: 123 mg/dL — AB (ref 65–99)
GLUCOSE-CAPILLARY: 127 mg/dL — AB (ref 65–99)
GLUCOSE-CAPILLARY: 125 mg/dL — AB (ref 65–99)
Glucose-Capillary: 122 mg/dL — ABNORMAL HIGH (ref 65–99)
Glucose-Capillary: 126 mg/dL — ABNORMAL HIGH (ref 65–99)

## 2015-12-21 LAB — BASIC METABOLIC PANEL
Anion gap: 12 (ref 5–15)
BUN: 36 mg/dL — AB (ref 6–20)
CALCIUM: 8.8 mg/dL — AB (ref 8.9–10.3)
CO2: 28 mmol/L (ref 22–32)
CREATININE: 1.48 mg/dL — AB (ref 0.61–1.24)
Chloride: 115 mmol/L — ABNORMAL HIGH (ref 101–111)
GFR calc non Af Amer: 55 mL/min — ABNORMAL LOW (ref >60)
Glucose, Bld: 141 mg/dL — ABNORMAL HIGH (ref 65–99)
Potassium: 3.7 mmol/L (ref 3.5–5.1)
Sodium: 155 mmol/L — ABNORMAL HIGH (ref 135–145)

## 2015-12-21 LAB — VALPROIC ACID LEVEL: VALPROIC ACID LVL: 52 ug/mL (ref 50.0–100.0)

## 2015-12-21 LAB — MAGNESIUM: Magnesium: 2.3 mg/dL (ref 1.7–2.4)

## 2015-12-21 LAB — PHOSPHORUS: PHOSPHORUS: 3.6 mg/dL (ref 2.5–4.6)

## 2015-12-21 LAB — SODIUM
SODIUM: 152 mmol/L — AB (ref 135–145)
Sodium: 152 mmol/L — ABNORMAL HIGH (ref 135–145)
Sodium: 150 mmol/L — ABNORMAL HIGH (ref 135–145)

## 2015-12-21 MED ORDER — VITAL HIGH PROTEIN PO LIQD
1000.0000 mL | ORAL | Status: DC
  Administered 2015-12-25 – 2015-12-26 (×2): 1000 mL
  Filled 2015-12-21: qty 1000

## 2015-12-21 MED ORDER — ETOMIDATE 2 MG/ML IV SOLN
40.0000 mg | Freq: Once | INTRAVENOUS | Status: AC
  Administered 2015-12-22: 20 mg via INTRAVENOUS
  Filled 2015-12-21: qty 20

## 2015-12-21 MED ORDER — PRO-STAT SUGAR FREE PO LIQD
30.0000 mL | Freq: Every day | ORAL | Status: DC
  Administered 2015-12-21 – 2015-12-27 (×24): 30 mL
  Filled 2015-12-21 (×21): qty 30

## 2015-12-21 MED ORDER — MIDAZOLAM HCL 2 MG/2ML IJ SOLN
4.0000 mg | Freq: Once | INTRAMUSCULAR | Status: AC
  Administered 2015-12-22: 2 mg via INTRAVENOUS
  Filled 2015-12-21: qty 4

## 2015-12-21 MED ORDER — PROPOFOL 500 MG/50ML IV EMUL
5.0000 ug/kg/min | Freq: Once | INTRAVENOUS | Status: AC
  Administered 2015-12-22: 100 mg/h via INTRAVENOUS
  Filled 2015-12-21: qty 50

## 2015-12-21 MED ORDER — VECURONIUM BROMIDE 10 MG IV SOLR
10.0000 mg | Freq: Once | INTRAVENOUS | Status: AC
  Administered 2015-12-22: 10 mg via INTRAVENOUS
  Filled 2015-12-21: qty 10

## 2015-12-21 MED ORDER — FENTANYL CITRATE (PF) 100 MCG/2ML IJ SOLN
200.0000 ug | Freq: Once | INTRAMUSCULAR | Status: AC
  Administered 2015-12-22: 200 ug via INTRAVENOUS
  Filled 2015-12-21: qty 4

## 2015-12-21 NOTE — Consult Note (Addendum)
Reason for Consult:stroke, need for enteral access Referring Physician: Ellin Goodie  Austin Mccarty is an 50 y.o. male.  HPI: Austin Mccarty was admitted after R thalamic and intraventricular hemorrhages. This is felt to be due to HTN. He also had an anoxic injury related to event. He remains comatose despite aggressive efforts. He is vent dependent and undergoing trach tomorrow. We are asked to place PEG tube for enteral access.  Past Medical History  Diagnosis Date  . CKD (chronic kidney disease) stage 2, GFR 60-89 ml/min   . CAP (community acquired pneumonia) 01/08/2015  . Foreign body, eye 2009  . Hypertension     hx/pt 01/08/2015  . Chronic systolic (congestive) heart failure [facility])     Past Surgical History  Procedure Laterality Date  . Eye surgery Right 2009    "removed glass"    Family History  Problem Relation Age of Onset  . Hypertension Father     Social History:  reports that he quit smoking about 8 years ago. His smoking use included Cigarettes. He has a 8 pack-year smoking history. He has never used smokeless tobacco. He reports that he does not drink alcohol or use illicit drugs.  Allergies:  Allergies  Allergen Reactions  . Ibuprofen Itching and Swelling    Medications:  Scheduled: . antiseptic oral rinse  7 mL Mouth Rinse 10 times per day  . carvedilol  25 mg Oral BID WC  . chlorhexidine gluconate (SAGE KIT)  15 mL Mouth Rinse BID  . cloNIDine  0.3 mg Oral 3 times per day  . docusate  200 mg Per Tube BID  . etomidate  40 mg Intravenous Once  . feeding supplement (PRO-STAT SUGAR FREE 64)  30 mL Per Tube 5 X Daily  . fentaNYL (SUBLIMAZE) injection  200 mcg Intravenous Once  . free water  200 mL Per Tube Q4H  . hydrALAZINE  100 mg Oral TID  . insulin aspart  0-20 Units Subcutaneous 6 times per day  . levETIRAcetam  1,500 mg Intravenous Q12H  . losartan  100 mg Oral Daily  . midazolam  4 mg Intravenous Once  . pantoprazole sodium  40 mg Per Tube Q1200  .  piperacillin-tazobactam (ZOSYN)  IV  3.375 g Intravenous Q8H  . propofol  5-80 mcg/kg/min Intravenous Once  . valproate sodium  1,250 mg Intravenous 3 times per day  . vecuronium  10 mg Intravenous Once   Continuous: . clevidipine    . feeding supplement (VITAL HIGH PROTEIN) 1,000 mL (12/21/15 1300)   WVP:XTGGYI chloride, Place/Maintain arterial line **AND** sodium chloride, acetaminophen (TYLENOL) oral liquid 160 mg/5 mL, fentaNYL (SUBLIMAZE) injection, labetalol, LORazepam  Results for orders placed or performed during the hospital encounter of 12/17/2015 (from the past 48 hour(s))  Glucose, capillary     Status: Abnormal   Collection Time: 12/19/15  4:02 PM  Result Value Ref Range   Glucose-Capillary 135 (H) 65 - 99 mg/dL  Sodium     Status: Abnormal   Collection Time: 12/19/15  4:35 PM  Result Value Ref Range   Sodium 155 (H) 135 - 145 mmol/L  Glucose, capillary     Status: Abnormal   Collection Time: 12/19/15  8:08 PM  Result Value Ref Range   Glucose-Capillary 137 (H) 65 - 99 mg/dL  Sodium     Status: Abnormal   Collection Time: 12/19/15  9:30 PM  Result Value Ref Range   Sodium 156 (H) 135 - 145 mmol/L  Glucose, capillary  Status: Abnormal   Collection Time: 12/19/15 11:52 PM  Result Value Ref Range   Glucose-Capillary 121 (H) 65 - 99 mg/dL  Glucose, capillary     Status: Abnormal   Collection Time: 12/20/15  3:39 AM  Result Value Ref Range   Glucose-Capillary 110 (H) 65 - 99 mg/dL  Sodium     Status: Abnormal   Collection Time: 12/20/15  4:10 AM  Result Value Ref Range   Sodium 156 (H) 135 - 145 mmol/L  Valproic acid level     Status: Abnormal   Collection Time: 12/20/15  4:10 AM  Result Value Ref Range   Valproic Acid Lvl 48 (L) 50.0 - 100.0 ug/mL  Glucose, capillary     Status: Abnormal   Collection Time: 12/20/15  8:09 AM  Result Value Ref Range   Glucose-Capillary 121 (H) 65 - 99 mg/dL  Sodium     Status: Abnormal   Collection Time: 12/20/15 10:14 AM   Result Value Ref Range   Sodium 156 (H) 135 - 145 mmol/L  Glucose, capillary     Status: Abnormal   Collection Time: 12/20/15 11:59 AM  Result Value Ref Range   Glucose-Capillary 142 (H) 65 - 99 mg/dL   Comment 1 Notify RN    Comment 2 Document in Chart   Glucose, capillary     Status: Abnormal   Collection Time: 12/20/15  3:49 PM  Result Value Ref Range   Glucose-Capillary 119 (H) 65 - 99 mg/dL   Comment 1 Notify RN    Comment 2 Document in Chart   Sodium     Status: Abnormal   Collection Time: 12/20/15  4:17 PM  Result Value Ref Range   Sodium 154 (H) 135 - 145 mmol/L  Glucose, capillary     Status: Abnormal   Collection Time: 12/20/15  7:22 PM  Result Value Ref Range   Glucose-Capillary 124 (H) 65 - 99 mg/dL   Comment 1 Notify RN    Comment 2 Document in Chart   Sodium     Status: Abnormal   Collection Time: 12/20/15 10:15 PM  Result Value Ref Range   Sodium 152 (H) 135 - 145 mmol/L  Glucose, capillary     Status: Abnormal   Collection Time: 12/20/15 11:00 PM  Result Value Ref Range   Glucose-Capillary 138 (H) 65 - 99 mg/dL  Valproic acid level     Status: None   Collection Time: 12/21/15  3:44 AM  Result Value Ref Range   Valproic Acid Lvl 52 50.0 - 100.0 ug/mL  CBC     Status: Abnormal   Collection Time: 12/21/15  3:44 AM  Result Value Ref Range   WBC 8.6 4.0 - 10.5 K/uL   RBC 4.59 4.22 - 5.81 MIL/uL   Hemoglobin 10.5 (L) 13.0 - 17.0 g/dL   HCT 33.4 (L) 39.0 - 52.0 %   MCV 72.8 (L) 78.0 - 100.0 fL   MCH 22.9 (L) 26.0 - 34.0 pg   MCHC 31.4 30.0 - 36.0 g/dL   RDW 20.5 (H) 11.5 - 15.5 %   Platelets 175 150 - 400 K/uL  Basic metabolic panel     Status: Abnormal   Collection Time: 12/21/15  3:44 AM  Result Value Ref Range   Sodium 155 (H) 135 - 145 mmol/L   Potassium 3.7 3.5 - 5.1 mmol/L   Chloride 115 (H) 101 - 111 mmol/L   CO2 28 22 - 32 mmol/L   Glucose, Bld 141 (H) 65 -  99 mg/dL   BUN 36 (H) 6 - 20 mg/dL   Creatinine, Ser 1.48 (H) 0.61 - 1.24 mg/dL    Calcium 8.8 (L) 8.9 - 10.3 mg/dL   GFR calc non Af Amer 55 (L) >60 mL/min   GFR calc Af Amer >60 >60 mL/min    Comment: (NOTE) The eGFR has been calculated using the CKD EPI equation. This calculation has not been validated in all clinical situations. eGFR's persistently <60 mL/min signify possible Chronic Kidney Disease.    Anion gap 12 5 - 15  Magnesium     Status: None   Collection Time: 12/21/15  3:44 AM  Result Value Ref Range   Magnesium 2.3 1.7 - 2.4 mg/dL  Phosphorus     Status: None   Collection Time: 12/21/15  3:44 AM  Result Value Ref Range   Phosphorus 3.6 2.5 - 4.6 mg/dL  Glucose, capillary     Status: Abnormal   Collection Time: 12/21/15  3:56 AM  Result Value Ref Range   Glucose-Capillary 123 (H) 65 - 99 mg/dL  Blood gas, arterial     Status: Abnormal   Collection Time: 12/21/15  4:20 AM  Result Value Ref Range   FIO2 0.30    Delivery systems VENTILATOR    Mode PRESSURE REGULATED VOLUME CONTROL    VT 550 mL   LHR 20 resp/min   Peep/cpap 5.0 cm H20   pH, Arterial 7.446 7.350 - 7.450   pCO2 arterial 42.1 35.0 - 45.0 mmHg   pO2, Arterial 92.9 80.0 - 100.0 mmHg   Bicarbonate 28.6 (H) 20.0 - 24.0 mEq/L   TCO2 29.9 0 - 100 mmol/L   Acid-Base Excess 4.6 (H) 0.0 - 2.0 mmol/L   O2 Saturation 97.0 %   Patient temperature 98.2    Collection site A-LINE    Drawn by [phone]    Sample type ARTERIAL   Glucose, capillary     Status: Abnormal   Collection Time: 12/21/15  8:31 AM  Result Value Ref Range   Glucose-Capillary 123 (H) 65 - 99 mg/dL   Comment 1 Notify RN    Comment 2 Document in Chart   Sodium     Status: Abnormal   Collection Time: 12/21/15 10:06 AM  Result Value Ref Range   Sodium 152 (H) 135 - 145 mmol/L  Glucose, capillary     Status: Abnormal   Collection Time: 12/21/15 12:16 PM  Result Value Ref Range   Glucose-Capillary 127 (H) 65 - 99 mg/dL   Comment 1 Notify RN    Comment 2 Document in Chart     Dg Chest Port 1 View  12/21/2015  CLINICAL  DATA:  Respiratory failure, intubated EXAM: PORTABLE CHEST 1 VIEW COMPARISON:  12/19/2015 FINDINGS: The endotracheal tube is now 7.3 cm above the carina. Vascular catheter left neck with tip projecting to the left of midline over the mediastinum. No change in this. Correlate clinically. Stable NG tube. Stable mild cardiac enlargement and vascular congestion. Bilateral opacities appear improved. IMPRESSION: Improved aeration bilaterally. Lines and tubes as described above. Left neck vascular catheter does not appear in anticipated position. Cannot confirm correct intravascular position. Critical Value/emergent results were called by telephone at the time of interpretation on 12/21/2015 at 8:01 am Myriam Jacobson, the patient's nurse, who verbally acknowledged these results. Electronically Signed   By: Skipper Cliche M.D.   On: 12/21/2015 08:02    Review of Systems  Unable to perform ROS: intubated   Blood pressure 113/79, pulse 86,  temperature 99.6 F (37.6 C), temperature source Axillary, resp. rate 30, height _0  (1.854 m), weight 112.6 kg (248 lb 3.8 oz), SpO2 99 %. Physical Exam  Constitutional: He appears well-developed.  HENT:  Lots of oral secretions  Neck: No tracheal deviation present.  Cardiovascular: Normal rate and normal heart sounds.   Respiratory: Breath sounds normal. No respiratory distress. He has no wheezes.  On vent  GI: Soft. He exhibits no distension. There is no tenderness.  Neurological:  unresponsive    Assessment/Plan: S/P thalamic and intraventricular hemorrhages as well as anoxic injury. Will plan PEG placement at the bedside 4/6. I D/W Dr. Nelda Marseille.   Traeh Milroy E 12/21/2015, 3:36 PM

## 2015-12-21 NOTE — Progress Notes (Signed)
Family conflicted with decision for trach/PEG; ethics committee has been called in to assist with decision making.  Will continue to follow progress/ assist as needed.  Please feel free to contact me if needed.    Quintella BatonJulie W. Cricket Goodlin, RN, BSN  Trauma/Neuro ICU Case Manager [phone]

## 2015-12-21 NOTE — Progress Notes (Signed)
   12/21/15 1650  Clinical Encounter Type  Visited With Patient and family together;Health care provider  Visit Type Initial;Spiritual support;Critical Care  Referral From Nurse  Stress Factors  Family Stress Factors Health changes   Chaplain responded to a request from the nurse to offer support for the family. Chaplain met with patient's brother, offered support, reviewed sacred text, and facilitated life review. Spiritual care services available as needed.   Jeri Lager, Chaplain 12/21/2015 4:52 PM

## 2015-12-21 NOTE — Progress Notes (Signed)
Met with ethics committee and discussed Mr Usery situation at length, in my opinion patient is not suffering, his GCS score is 3 so I can not imagine any sensation.  The family is insistent on trach/peg and they are very much aware that he will not be staying in [place] as [place] medicaid does not provide coverage for vent SNF which is what the patient will need.  They are agreeable to him moving out of state once trach/peg are in place.  I came back and spoke with the wife and brother at length.  Will proceed with trach in AM and PEG when surgery can perform.  Plan on trach 4/5 at 10 AM.  The patient is critically ill with multiple organ systems failure and requires high complexity decision making for assessment and support, frequent evaluation and titration of therapies, application of advanced monitoring technologies and extensive interpretation of multiple databases.   Critical Care Time devoted to patient care services described in this note is  60  Minutes. This time reflects time of care of this signee Dr Jennet Maduro. This critical care time does not reflect procedure time, or teaching time or supervisory time of PA/NP/Med student/Med Resident etc but could involve care discussion time.  Rush Farmer, M.D. [facility] Pulmonary/Critical Care Medicine. Pager: [phone]. After hours pager: [phone].

## 2015-12-21 NOTE — Progress Notes (Signed)
VASCULAR LAB PRELIMINARY  PRELIMINARY  PRELIMINARY  PRELIMINARY  Transcranial Doppler  Date POD PCO2 HCT BP  MCA ACA PCA OPHT SIPH VERT Basilar  12/21/15 VS     Right  Left   85  71   -41  -26   26  58   28  28   31  31    -24  -49     -50         Right  Left                                            Right  Left                                             Right  Left                                             Right  Left                                            Right  Left                                            Right  Left                                        MCA = Middle Cerebral Artery      OPHT = Opthalmic Artery     BASILAR = Basilar Artery   ACA = Anterior Cerebral Artery     SIPH = Carotid Siphon PCA = Posterior Cerebral Artery   VERT = Verterbral Artery                   Normal MCA = 62+\-12 ACA = 50+\-12 PCA = 42+\-23   Mild technical difficulty due to ET tube restraining strap.    Jerrye Seebeck, RVS 12/21/2015, 1:19 PM

## 2015-12-21 NOTE — Progress Notes (Signed)
PULMONARY / CRITICAL CARE MEDICINE   Name: Austin Mccarty MRN: [id number] DOB: 06/17/1966    ADMISSION DATE:  11/17/2015  CHIEF COMPLAINT:  PEA arrest  HISTORY OF PRESENT ILLNESS:   50 yo male with h/o HTN, HFrEF, CKD, DCM 2/2 HTN who presented 3/26 from home with loss of consciousness.  Marland Kitchen.  He arrived in the ED with sats in the 60's, emergently intubated then PEA arrest x9215min then ROSC.  He was found to have a large right sided ICH with intraventricular hemorrhage likely 2/2 HTN.  SUBJECTIVE: Off sedation, no seizure and remains unresponsive.  Posturing this AM.  VITAL SIGNS: Temp:  [97.5 F (36.4 C)-98.7 F (37.1 C)] 97.5 F (36.4 C) (04/04 0800) Pulse Rate:  [65-87] 65 (04/04 0800) Resp:  [10-26] 20 (04/04 0800) BP: (109-183)/(57-91) 111/57 mmHg (04/04 0800) SpO2:  [97 %-100 %] 100 % (04/04 0800) Arterial Line BP: (109-212)/(53-76) 111/53 mmHg (04/04 0800) FiO2 (%):  [30 %] 30 % (04/04 0800) Weight:  [112.6 kg (248 lb 3.8 oz)] 112.6 kg (248 lb 3.8 oz) (04/04 0401) HEMODYNAMICS:   VENTILATOR SETTINGS: Vent Mode:  [-] PRVC FiO2 (%):  [30 %] 30 % Set Rate:  [20 bmp] 20 bmp Vt Set:  [550 mL] 550 mL PEEP:  [5 cmH20] 5 cmH20 Plateau Pressure:  [19 cmH20-21 cmH20] 20 cmH20 INTAKE / OUTPUT:  Intake/Output Summary (Last 24 hours) at 12/21/15 0928 Last data filed at 12/21/15 0800  Gross per 24 hour  Intake   1900 ml  Output   2113 ml  Net   -213 ml   PHYSICAL EXAMINATION: General:  Unresponsive. Ventilated, acutely ill.  Unresponsive off sedation. Integument:  Warm & dry. No rash on exposed skin.  HEENT:  No scleral injection or icterus. Endotracheal tube in place.  Cardiovascular:  Regular rate. Normal S1 & S2. No appreciable JVD.  Pulmonary:  Distant breath sounds bilaterally. Symmetric chest wall rise on ventilator. Abdomen: Soft. Normal bowel sounds. Nondistended.  Neurological:  Unresponsive. Pupils equal. No withdrawal to pain in  extremities.  LABS:  CBC  Recent Labs Lab 12/18/15 0547 12/19/15 0440 12/21/15 0344  WBC 8.6 8.4 8.6  HGB 10.4* 10.4* 10.5*  HCT 34.6* 35.0* 33.4*  PLT 166 177 175   Coag's No results for input(s): APTT, INR in the last 168 hours. BMET  Recent Labs Lab 12/18/15 0547  12/19/15 0440  12/20/15 1617 12/20/15 2215 12/21/15 0344  NA 157*  < > 155*  < > 154* 152* 155*  K 3.8  --  3.8  --   --   --  3.7  CL 120*  --  118*  --   --   --  115*  CO2 29  --  27  --   --   --  28  BUN 47*  --  42*  --   --   --  36*  CREATININE 2.11*  --  1.59*  --   --   --  1.48*  GLUCOSE 146*  --  160*  --   --   --  141*  < > = values in this interval not displayed. Electrolytes  Recent Labs Lab 12/18/15 0547 12/19/15 0440 12/21/15 0344  CALCIUM 8.8* 8.8* 8.8*  MG 2.6* 2.5* 2.3  PHOS 5.0* 4.1 3.6   Sepsis Markers No results for input(s): LATICACIDVEN, PROCALCITON, O2SATVEN in the last 168 hours. ABG  Recent Labs Lab 12/18/15 0340 12/19/15 0425 12/21/15 0420  PHART 7.448 7.462* 7.446  PCO2ART 43.3 41.8 42.1  PO2ART 78.5* 84.5 92.9   Liver Enzymes No results for input(s): AST, ALT, ALKPHOS, BILITOT, ALBUMIN in the last 168 hours. Cardiac Enzymes No results for input(s): TROPONINI, PROBNP in the last 168 hours. Glucose  Recent Labs Lab 12/20/15 1159 12/20/15 1549 12/20/15 1922 12/20/15 2300 12/21/15 0356 12/21/15 0831  GLUCAP 142* 119* 124* 138* 123* 123*   Imaging Dg Chest Port 1 View  12/21/2015  CLINICAL DATA:  Respiratory failure, intubated EXAM: PORTABLE CHEST 1 VIEW COMPARISON:  12/19/2015 FINDINGS: The endotracheal tube is now 7.3 cm above the carina. Vascular catheter left neck with tip projecting to the left of midline over the mediastinum. No change in this. Correlate clinically. Stable NG tube. Stable mild cardiac enlargement and vascular congestion. Bilateral opacities appear improved. IMPRESSION: Improved aeration bilaterally. Lines and tubes as described  above. Left neck vascular catheter does not appear in anticipated position. Cannot confirm correct intravascular position. Critical Value/emergent results were called by telephone at the time of interpretation on 12/21/2015 at 8:01 am Baird Lyons, the patient's nurse, who verbally acknowledged these results. Electronically Signed   By: Esperanza Heir M.D.   On: 12/21/2015 08:02   EVENTS: 3/26 - cardiac arrest 3/27 - high dose versed for seizures 3/27 - ventriculostomy  STUDIES: TTE (10/29/15):  EF 30-35% w/ diffuse hypokinesis. No AS or AR. Mild MR but no MS. RV moderately dilated w/ normal systolic function. LA severely dilated & RA mildly dilated. CT HEAD 2:19AM 3/26:  R Thalamic hemorrhage as well as IVH w/ mild mass effect & 2mm right to left midline shift. Possible mild diffuse cerebral edema. Echo 3/27 EF 40% MRI 3/31>>>Diffuse brain damage.  MICROBIOLOGY: Blood Ctx x2 3/26>>> Urine Ctx 3/26>>> Tracheal Asp Ctx 3/26>>> Influenza PCR 3/26>>> Respiratory Viral Panel PCR 3/26>>> Urine Strep Ag 3/26>>> Urine Legionella Ag 3/26>>> MRSA PCR 3/26:  Negative   ANTIBIOTICS: Vancomycin 3/26>>>3/31 Zosyn 3/26>>>4/4  LINES/TUBES: OETT 7.5 3/26>>> OGT 3/26>>> FOLEY 3/26>>> 3/27 vstomy >>  ASSESSMENT / PLAN:  NEUROLOGIC A:   Right sided ICH - Sub-falcine herniation. Intraventricular Hemorrhage Status epilepticus UDS neg P:   Neurology following. MRI diffuse brain injury, anoxic. D/Ced all sedation. Keppra & valproate IV. SBP goal 120-140.  PULMONARY A: Acute Hypoxic/ hypercarbic  Respiratory Failure - Secondary to pulmonary edema. Pulmonary Edema - Cardiogenic vs Neurogenic  P:   Full Vent Support, failed weaning with high RR and low Tv. Titrate O2 for sat of 88-92%. Adjust vent for ABG. ABG and CXR in AM. No weaning given mental status.  CARDIOVASCULAR A:  Possible Acute Systolic CHF Exacerbation - EF 30-35% 10/2015. Hypertensive Emergency  P:  Cardene gtt to  maintain SBP 120-140, now off. Hold anti-HTN for now.  RENAL A:   Acute on Chronic Renal Failure - likely hypervolemic/cardiorenal syndrome. Lactic Acidosis - Resolving. Hyponatremia on admit, now on 3% saline  P:   Monitoring UOP with Foley. Trending electrolytes & renal function daily. Replace electrolytes as indicated. Free water per neuro.  GASTROINTESTINAL A:   No acute issues.  P: Protonix IV daily. Continue TFs. Colace and mag citrate as ordered.  HEMATOLOGIC A:   Leukocytosis - Sepsis vs Stress Induced.  P:  Trending cell counts daily w/ CBC. SCDs.  INFECTIOUS A:   Possible CAP Possible UTI Fever overnight without evidence of infection.  P:   D/C Zosyn. Procalcitonin per algorithm noted, elevated but dropping.  ENDOCRINE A:   Hyperglycemia - No h/o DM.  P:   Accu-Checks  q4hr. SSI per algorithm.  FAMILY UPDATES:  Brother updated bedside, he spoke with the elders in [place], they understand that his brain is severely damaged but believe that from a religious standpoint, as long as there is a heart beat we are obligated to continue support.  I spoke with Dr. Pearlean Brownie and he believes that trach/peg is futile care.  I will be meeting with the ethics committee later.  We will likely have to approach this situation delicately.  The patient is critically ill with multiple organ systems failure and requires high complexity decision making for assessment and support, frequent evaluation and titration of therapies, application of advanced monitoring technologies and extensive interpretation of multiple databases.   Critical Care Time devoted to patient care services described in this note is  45  Minutes. This time reflects time of care of this signee Dr Koren Bound. This critical care time does not reflect procedure time, or teaching time or supervisory time of PA/NP/Med student/Med Resident etc but could involve care discussion time.  Alyson Reedy, M.D. [facility]  Pulmonary/Critical Care Medicine. Pager: [phone]. After hours pager: [phone].  12/21/2015, 9:28 AM

## 2015-12-21 NOTE — Progress Notes (Signed)
STROKE TEAM PROGRESS NOTE   SUBJECTIVE (INTERVAL HISTORY) His brother is  Not at the bedside this morning.  . Remains unresponsive and comatose. has been off sedation for 5 days now.  not following commands or respond to pain stimulation.Patient`s wife and family in [place] do not want to withdraw support OBJECTIVE Temp:  [97.5 F (36.4 C)-99.6 F (37.6 C)] 99.6 F (37.6 C) (04/04 1200) Pulse Rate:  [65-86] 86 (04/04 1532) Cardiac Rhythm:  [-] Normal sinus rhythm (04/04 0800) Resp:  [10-30] 30 (04/04 1532) BP: (104-157)/(51-79) 113/79 mmHg (04/04 1532) SpO2:  [95 %-100 %] 99 % (04/04 1532) Arterial Line BP: (105-207)/(48-72) 126/56 mmHg (04/04 1300) FiO2 (%):  [30 %] 30 % (04/04 1532) Weight:  [248 lb 3.8 oz (112.6 kg)] 248 lb 3.8 oz (112.6 kg) (04/04 0401)  CBC:   Recent Labs Lab 12/19/15 0440 12/21/15 0344  WBC 8.4 8.6  HGB 10.4* 10.5*  HCT 35.0* 33.4*  MCV 72.5* 72.8*  PLT 177 175    Basic Metabolic Panel:  Recent Labs Lab 12/19/15 0440  12/21/15 0344 12/21/15 1006  NA 155*  < > 155* 152*  K 3.8  --  3.7  --   CL 118*  --  115*  --   CO2 27  --  28  --   GLUCOSE 160*  --  141*  --   BUN 42*  --  36*  --   CREATININE 1.59*  --  1.48*  --   CALCIUM 8.8*  --  8.8*  --   MG 2.5*  --  2.3  --   PHOS 4.1  --  3.6  --   < > = values in this interval not displayed.  Lipid Panel:     Component Value Date/Time   CHOL 109 10/29/2015 0420   TRIG 101 12/16/2015 0415   HDL 34* 10/29/2015 0420   CHOLHDL 3.2 10/29/2015 0420   VLDL 12 10/29/2015 0420   LDLCALC 63 10/29/2015 0420   HgbA1c:  Lab Results  Component Value Date   HGBA1C 5.9* 2015-10-22   Urine Drug Screen:     Component Value Date/Time   LABOPIA NONE DETECTED 2015-10-22 0959   COCAINSCRNUR NONE DETECTED 2015-10-22 0959   LABBENZ NONE DETECTED 2015-10-22 0959   AMPHETMU NONE DETECTED 2015-10-22 0959   THCU NONE DETECTED 2015-10-22 0959   LABBARB NONE DETECTED 2015-10-22 0959      IMAGING I have  personally reviewed the radiological images below and agree with the radiology interpretations.  Ct Head Wo Contrast May 06, 2016   Right thalamic hemorrhage as well as intraventricular hemorrhages with mild mass effect and a 2 mm right-to-left midline shift. Diffuse effacement of the sulci with possible mild diffuse cerebral edema.    12/13/2015  1. Stable size of right basal ganglia hemorrhage.  2. Slight increase in midline shift, now measuring 7 mm.  3. Increased prominence of the temporal horns of the lateral ventricles compatible with developing hydrocephalus.    12/15/2015   1. Unchanged size of right thalamic hemorrhage. Decreased size of the ventricles following interval ventriculostomy catheter placement. Slightly decreased volume of intraventricular hemorrhage.  2. Decreased gray-white differentiation and diffuse sulcal effacement concerning for diffuse edema.    Dg Chest Portable 1 View May 06, 2016   Appliances appear to be in satisfactory location. Diffuse airspace disease throughout both lungs.   12/15/2015   1. Left IJ line tip noted in unchanged position to the left of midline. Reference is made to prior chest  x-ray report of 12/13/2015. Again intra-arterial location of the left IJ line cannot be excluded. This finding was reported verbally to the patient's caregiver on 12/13/2015. Endotracheal tube and NG tube in stable position.  2. Cardiomegaly with persistent bilateral pulmonary infiltrates/pulmonary edema. Similar findings noted on prior exam.   12/19/2015 Little significant change with bibasilar atelectasis/ airspace disease and cardiomegaly again noted. Left-sided vascular catheter as described-correlate clinically.   EEG 12/02/2015 Abnormal EEG in the comatose state demonstrating: 1. Burst suppression pattern (1-10 seconds of burst activity with up to 10-30 seconds of suppression). 2. During the burst activity, there are frequent right frontal-temporal poly-spike and  spike-and-wave discharges. These are associated with myoclonic jerks on video recording.   LTM EEG  12/13/15 This EEG is consistent with diffuse non-specific cerebral dysfunction, with epileptiform discharges in the right hemisphere that improve though the duration of the recording. No electrographic seizures were seen. 12/14/15 This EEG is consistent with diffuse non-specific cerebral dysfunction, with suggestion of cortical irritability in the right hemisphere. No electrographic seizures were seen.   TTE - - Left ventricle: The cavity size was moderately dilated. Wall  thickness was normal. The estimated ejection fraction was 40%.  Diffuse hypokinesis.   Renal artery ultrasound - - Bilateral abnormal intrarenal resistive indices. - No evidence of right renal artery stenosis. - No evidence of left renal artery stenosis.   Mri and Mra Head Wo Contrast 12/17/2015   1. Bilateral cortical edema/infarction and small caudate infarcts compatible with global anoxic injury.  2. Size stable right thalamic and intraventricular hemorrhage. No MRA finding to explain the hematoma.  3. Right frontal shunt with stable ventricular volume and preferential right lateral ventricle drainage.  4. Progressive sinusitis and mastoiditis.     PHYSICAL EXAMINATION  Temp:  [97.5 F (36.4 C)-99.6 F (37.6 C)] 99.6 F (37.6 C) (04/04 1200) Pulse Rate:  [65-86] 86 (04/04 1532) Resp:  [10-30] 30 (04/04 1532) BP: (104-157)/(51-79) 113/79 mmHg (04/04 1532) SpO2:  [95 %-100 %] 99 % (04/04 1532) Arterial Line BP: (105-207)/(48-72) 126/56 mmHg (04/04 1300) FiO2 (%):  [30 %] 30 % (04/04 1532) Weight:  [248 lb 3.8 oz (112.6 kg)] 248 lb 3.8 oz (112.6 kg) (04/04 0401)  General - Well nourished, well developed, intubated and off sedation.  Ophthalmologic - Fundi not visualized due to ET positioning.  Cardiovascular - Regular rhythm and rate.  Neuro - intubated and off sedation, does not respond to voice or  sternal rub. No pupillary reflexes, no dolls eyes, weak corneal bilaterally, with very weak gag. Not withdraw to pain in all extremities. DTR diminished and no babinski. Sensation, coordination and gait not tested. Not breathing over the vent.   ASSESSMENT/PLAN Mr. Amram Maya is a 50 y.o. male with history of chronic kidney disease, community-acquired pneumonia, and hypertension, congestive heart failure, presenting with confusion, dysarthria, and subsequent loss of consciousness. Patient required intubation and went into PEA arrest for 15 min with resuscitation. Found to have a right BG ICH with IVH.  He did not receive IV t-PA due to hemorrhage.  Comatose state due to Global anoxic brain injury due to prolonged cardiac arrest  Cardiac arrest for  MRI consistent with global anoxic brain injury  EEG myoclonic status -> flat lines  Neuro exam showed weak corneal and gag, no papillary or dolls eyes, not breathing over the vent  Poor prognosis  Dr. Pearlean Brownie had long discussion with brother at bedside 12/20/15 regarding the current diagnosis and prognosis including persistent vegetative  state. He would like to have further discussion with family members.   ICH:  right BG ICH with ventricular extension.  CT showed right BG ICH with IVH  Repeat CT head showed stable hematoma but obstructive hydrocephalus  Repeat CT again showed stable hematoma and decreased hydrocephalus after EVD  2D Echo EF 40%, improved from prior.  LDL 60 on 10/29/2015  HgbA1c 5.9  VTE prophylaxis - SCDs and heparin subq Diet NPO time specified Diet NPO time specified  No antithrombotic prior to admission, now on No antithrombotic secondary to hemorrhage.  Ongoing aggressive stroke risk factor management  Disposition - pending  Obstructive hydrocephalus  CT repeat showed above findings  EVD placed by NSG  Repeat CT showed decreased hydrocephalus  MRI showed stable ventricle  size  Seizure  Initial stat EEG burst suppression with myoclonic status  Put on versed drip high dose - weaning off  Put on keppra and depakote  LTM EEG so far showed no seizure but slowing -> flat lines  Maximize keppra to  bid  depakote load again and increase to  Q8  depakote level 33->48->69->61->51  PRN Ativan  Continue LTM EEG flat lines with versed weaning off - currently off  Cerebral edema  CT repeat showed increase midline shift  D/c mannitol  3% saline on hold  Na Q6h 149->154->157->160->157->159->155 (Sunday)  Na goal 150-155  On free water 200 ml Q4 hrs  Cardiac arrest  Likely related to IVH down to IV ventricle  Resuscitated but with global hypoxic injury  Burst suppression on EEG, resolved on versed -> slowing -> flat lines  NSR now  respiratory failure and aspiration pneumonia  Intubated   CCM on board  On vanco and zosyn   Leukocytosis 17.0->21.2->14.1->11.9->9.1->7.8-> 8.4 (Sunday)  Hypertension  Managed with Cardene PRN  Developed with hypotension and on Neo, now off  BP goal < 160  Resumed home meds coreg, clonidine, hydralazine, and cozaar  Increased hydralazine to  tid  Increase clonidine to 0.3 mg tid  PRN Labetalol  Fever, resolved  UA negative  BCx NGTD  Urine Cx - no growth  Discuss with NSG about CSF sampling, but unlikely ventriculitis  CXR stable 12/19/2015  On Zosyn (Vanco D/C'd)  Central fever possible  Other Stroke Risk Factors  Cigarette smoker, quit smoking 8 years ago.  Obesity, Body mass index is 32.76 kg/(m^2).   Other Active Problems  Cardiomyopathy EF 40%   Chronic kidney disease, Cre 2.41->2.26->1.78 ->1.36-> 1.59 (Sunday)  Comatose state s/p hypoxic injury from PEA cardiac arrest  Anemia    (Hb - 10.4)    (Hct - 35) (Sunday)  Calcium - 8.8      Mg - 2.5  Hospital day # 9  This patient is critically ill due to right ICH and IVH, hydrocephalus, seizure,  cardiac arrest, cerebral edema and at significant risk of neurological worsening, death form recurrent bleeding, brain herniation, heart failure, cardiac arrest, hydrocephalus. This patient's care requires constant monitoring of vital signs, hemodynamics, respiratory and cardiac monitoring, review of multiple databases, neurological assessment, discussion with family, other specialists and medical decision making of high complexity. I spent 30 minutes of neurocritical care time in the care of this patient.I had long discussion with brother at bedside on 12/20/15 regarding the current diagnosis and prognosis including persistent vegetative state. He  understands patient's poor prognosis and need to stop ventilatory support however he states the patient's wife and family background do not understand this and did not want  to withdraw life support at the present time. I explained to him that the patient is likely need a tracheostomy, PEG tube and prolonged ventilatory support which may not be possible in state and patient may need to move out of state to a facility which may not be convenient for the family to visit. He understands this and will communicate this to the family. D/w Dr Molli Knock  Personally examined patient and images, and have participated in and made any corrections needed to history, physical, neuro exam,assessment and plan as stated above.  I have personally obtained the history, evaluated lab date, reviewed imaging studies and agree with radiology interpretations.  Plan check TCD study  Delia Heady MD Stroke Neurology     To contact Stroke Continuity provider, please refer to [email or web address]. After hours, contact General Neurology

## 2015-12-21 NOTE — Progress Notes (Signed)
Patient ID: Maudry DiegoMohialdin H. Tarquinio, male   DOB: 09/30/1965, 50 y.o.   MRN: [id number] I spoke with his older brother and explainned the planned PEG placement including the risks and benefits. He agrees.  Violeta GelinasBurke Margrette Wynia, MD, MPH, FACS Trauma: [phone] General Surgery: [phone]  12/21/2015 4:55 PM

## 2015-12-21 NOTE — Progress Notes (Signed)
Nutrition Follow-up  INTERVENTION:   Decrease Vital High Protein to 45 ml/hr Add 30 ml Prostat five times per day Provides: 1580 kcal, 169 grams protein, 902 ml H2O. Total free water: 2102 ml    NUTRITION DIAGNOSIS:   Inadequate oral intake related to inability to eat as evidenced by NPO status. Ongoing.   GOAL:   Patient will meet greater than or equal to 90% of their needs Met.   MONITOR:   PO intake, Labs, I & O's, Skin, TF tolerance  ASSESSMENT:   50 yo male with h/o HTN, HFrEF, CKD, DCM 2/2 HTN who presents from home with loss of consciousness. Per report from friends and brother, he was in his usual state of health without any complaints. He was with his friends today and became acutely encephalopathic, confused and dysarthric, he slurred his speech then fell back in his chair. He arrived in the ED with sats in the 60's, emergently intubated then PEA arrest x61mn then ROSC. He was found to have a large right sided IPH with intraventricular hemorrhage likely 2/2 HTN.  Patient is currently intubated on ventilator support MV: 11.2 L/min Temp (24hrs), Avg:98.1 F (36.7 C), Min:97.5 F (36.4 C), Max:98.7 F (37.1 C)  Vital High Protein @ 50 ml/hr = 1200 kcal, 105 grams protein Free water: 200 ml every 4 hours = 1200 ml Labs reviewed: sodium elevated 155, CBG's: 123 MD discussing plan of care with family, pt with poor prognosis.   Diet Order:  Diet NPO time specified  Skin:  Reviewed, no issues  Last BM:  4/3  Height:   Ht Readings from Last 1 Encounters:  12/03/2015 '6\' 1"'$  (1.854 m)    Weight:   Wt Readings from Last 1 Encounters:  12/21/15 248 lb 3.8 oz (112.6 kg)    Ideal Body Weight:  83.63 kg  BMI:  Body mass index is 32.76 kg/(m^2).  Estimated Nutritional Needs:   Kcal:  1250-1600  Protein:  > /= 167 grams   Fluid:  >/= 1.25L  EDUCATION NEEDS:   No education needs identified at this time  [place] Pager [phone] Hours Pager

## 2015-12-21 NOTE — Progress Notes (Signed)
Per Radiologist reading of Chest xray they had concern of whether the central line was in the correct place or not. I had the MD review and he states the Central line is still ok to us.

## 2015-12-22 ENCOUNTER — Inpatient Hospital Stay (HOSPITAL_COMMUNITY): Payer: Medicaid Other

## 2015-12-22 LAB — CBC
HCT: 30.9 % — ABNORMAL LOW (ref 39.0–52.0)
HEMOGLOBIN: 9.8 g/dL — AB (ref 13.0–17.0)
MCH: 23 pg — AB (ref 26.0–34.0)
MCHC: 31.7 g/dL (ref 30.0–36.0)
MCV: 72.5 fL — ABNORMAL LOW (ref 78.0–100.0)
Platelets: 173 10*3/uL (ref 150–400)
RBC: 4.26 MIL/uL (ref 4.22–5.81)
RDW: 20.5 % — AB (ref 11.5–15.5)
WBC: 7.4 10*3/uL (ref 4.0–10.5)

## 2015-12-22 LAB — GLUCOSE, CAPILLARY
GLUCOSE-CAPILLARY: 113 mg/dL — AB (ref 65–99)
GLUCOSE-CAPILLARY: 105 mg/dL — AB (ref 65–99)
GLUCOSE-CAPILLARY: 109 mg/dL — AB (ref 65–99)
GLUCOSE-CAPILLARY: 151 mg/dL — AB (ref 65–99)
Glucose-Capillary: 114 mg/dL — ABNORMAL HIGH (ref 65–99)
Glucose-Capillary: 126 mg/dL — ABNORMAL HIGH (ref 65–99)

## 2015-12-22 LAB — BASIC METABOLIC PANEL
Anion gap: 14 (ref 5–15)
BUN: 44 mg/dL — AB (ref 6–20)
CALCIUM: 8.6 mg/dL — AB (ref 8.9–10.3)
CHLORIDE: 114 mmol/L — AB (ref 101–111)
CO2: 27 mmol/L (ref 22–32)
CREATININE: 1.58 mg/dL — AB (ref 0.61–1.24)
GFR, EST AFRICAN AMERICAN: 59 mL/min — AB (ref >60)
GFR, EST NON AFRICAN AMERICAN: 51 mL/min — AB (ref >60)
Glucose, Bld: 108 mg/dL — ABNORMAL HIGH (ref 65–99)
Potassium: 3.7 mmol/L (ref 3.5–5.1)
SODIUM: 155 mmol/L — AB (ref 135–145)

## 2015-12-22 LAB — VALPROIC ACID LEVEL: VALPROIC ACID LVL: 50 ug/mL (ref 50.0–100.0)

## 2015-12-22 LAB — PHOSPHORUS: Phosphorus: 4.1 mg/dL (ref 2.5–4.6)

## 2015-12-22 LAB — MAGNESIUM: MAGNESIUM: 2.3 mg/dL (ref 1.7–2.4)

## 2015-12-22 MED ORDER — HYDRALAZINE HCL 20 MG/ML IJ SOLN
INTRAMUSCULAR | Status: AC
  Filled 2015-12-22: qty 1

## 2015-12-22 MED ORDER — SODIUM CHLORIDE 0.9 % IV SOLN
1500.0000 mg | Freq: Two times a day (BID) | INTRAVENOUS | Status: DC
  Administered 2015-12-22 – 2015-12-23 (×3): 1500 mg via INTRAVENOUS
  Filled 2015-12-22 (×4): qty 15

## 2015-12-22 MED ORDER — LEVETIRACETAM 100 MG/ML PO SOLN: 1500.0000 mg | Freq: Two times a day (BID) | ORAL | Status: DC

## 2015-12-22 MED ORDER — VALPROATE SODIUM 500 MG/5ML IV SOLN
1250.0000 mg | Freq: Three times a day (TID) | INTRAVENOUS | Status: DC
  Administered 2015-12-22 – 2015-12-23 (×3): 1250 mg via INTRAVENOUS
  Filled 2015-12-22 (×5): qty 12.5

## 2015-12-22 MED ORDER — VALPROATE SODIUM 250 MG/5ML PO SYRP
1250.0000 mg | ORAL_SOLUTION | Freq: Three times a day (TID) | ORAL | Status: DC
Start: 2015-12-22 — End: 2015-12-22

## 2015-12-22 MED ORDER — HYDRALAZINE HCL 20 MG/ML IJ SOLN
10.0000 mg | INTRAMUSCULAR | Status: DC | PRN
  Administered 2015-12-22 (×2): 20 mg via INTRAVENOUS
  Administered 2015-12-23 (×3): 40 mg via INTRAVENOUS
  Administered 2015-12-25: 20 mg via INTRAVENOUS
  Administered 2015-12-26: 40 mg via INTRAVENOUS
  Filled 2015-12-22: qty 2
  Filled 2015-12-22: qty 1
  Filled 2015-12-22: qty 2
  Filled 2015-12-22: qty 1
  Filled 2015-12-22 (×2): qty 2

## 2015-12-22 NOTE — Procedures (Signed)
Bedside Tracheostomy Insertion Procedure Note   Patient Details:   Name: Austin Mccarty DOB: 04/11/1966 MRN: [id number]  Procedure: Tracheostomy  Pre Procedure Assessment: ET Tube Size: 7.5 ET Tube secured at lip (cm): 24 Bite block in place: No Breath Sounds: Rhonch  Post Procedure Assessment: BP 130/76 mmHg  Pulse 79  Temp(Src) 99.1 F (37.3 C) (Axillary)  Resp 20  Ht 6\' 1"  (1.854 m)  Wt 249 lb 1.9 oz (113 kg)  BMI 32.87 kg/m2  SpO2 100% O2 sats: stable throughout Complications: No apparent complications Patient did tolerate procedure well Tracheostomy Brand:Shiley Tracheostomy Style:Cuffed Tracheostomy Size: 8 Tracheostomy Secured WUJ:[id number]:Sutures, velcro Tracheostomy Placement Confirmation:Trach cuff visualized and in place and Chest X ray ordered for placement    Jacqulynn CadetHopper, Kylar Leonhardt David 12/22/2015, 1:37 PM

## 2015-12-22 NOTE — Progress Notes (Signed)
STROKE TEAM PROGRESS NOTE   SUBJECTIVE (INTERVAL HISTORY) His family is  not at the bedside this morning.  . Remains unresponsive and comatose. has been off sedation for 6 days now.  not following commands or respond to pain stimulation.Patient`s wife and family in [place] do not want to withdraw support OBJECTIVE Temp:  [98.8 F (37.1 C)-100.2 F (37.9 C)] 99.1 F (37.3 C) (04/05 1200) Pulse Rate:  [64-86] 79 (04/05 1205) Cardiac Rhythm:  [-] Normal sinus rhythm (04/05 0800) Resp:  [6-30] 20 (04/05 1205) BP: (101-143)/(52-79) 130/76 mmHg (04/05 1205) SpO2:  [97 %-100 %] 100 % (04/05 1205) Arterial Line BP: (106-174)/(50-68) 110/53 mmHg (04/05 1000) FiO2 (%):  [30 %-60 %] 60 % (04/05 1205) Weight:  [249 lb 1.9 oz (113 kg)] 249 lb 1.9 oz (113 kg) (04/05 0449)  CBC:   Recent Labs Lab 12/21/15 0344 12/22/15 0438  WBC 8.6 7.4  HGB 10.5* 9.8*  HCT 33.4* 30.9*  MCV 72.8* 72.5*  PLT 175 173    Basic Metabolic Panel:  Recent Labs Lab 12/21/15 0344  12/21/15 2212 12/22/15 0438  NA 155*  < > 150* 155*  K 3.7  --   --  3.7  CL 115*  --   --  114*  CO2 28  --   --  27  GLUCOSE 141*  --   --  108*  BUN 36*  --   --  44*  CREATININE 1.48*  --   --  1.58*  CALCIUM 8.8*  --   --  8.6*  MG 2.3  --   --  2.3  PHOS 3.6  --   --  4.1  < > = values in this interval not displayed.  Lipid Panel:     Component Value Date/Time   CHOL 109 10/29/2015 0420   TRIG 101 12/16/2015 0415   HDL 34* 10/29/2015 0420   CHOLHDL 3.2 10/29/2015 0420   VLDL 12 10/29/2015 0420   LDLCALC 63 10/29/2015 0420   HgbA1c:  Lab Results  Component Value Date   HGBA1C 5.9* 11/23/2015   Urine Drug Screen:     Component Value Date/Time   LABOPIA NONE DETECTED 11/27/2015 0959   COCAINSCRNUR NONE DETECTED 12/08/2015 0959   LABBENZ NONE DETECTED 12/03/2015 0959   AMPHETMU NONE DETECTED 12/13/2015 0959   THCU NONE DETECTED 11/23/2015 0959   LABBARB NONE DETECTED 12/01/2015 0959      IMAGING I have  personally reviewed the radiological images below and agree with the radiology interpretations.  Ct Head Wo Contrast 12/11/2015   Right thalamic hemorrhage as well as intraventricular hemorrhages with mild mass effect and a 2 mm right-to-left midline shift. Diffuse effacement of the sulci with possible mild diffuse cerebral edema.    12/13/2015  1. Stable size of right basal ganglia hemorrhage.  2. Slight increase in midline shift, now measuring 7 mm.  3. Increased prominence of the temporal horns of the lateral ventricles compatible with developing hydrocephalus.    12/15/2015   1. Unchanged size of right thalamic hemorrhage. Decreased size of the ventricles following interval ventriculostomy catheter placement. Slightly decreased volume of intraventricular hemorrhage.  2. Decreased gray-white differentiation and diffuse sulcal effacement concerning for diffuse edema.    Dg Chest Portable 1 View 12/06/2015   Appliances appear to be in satisfactory location. Diffuse airspace disease throughout both lungs.   12/15/2015   1. Left IJ line tip noted in unchanged position to the left of midline. Reference is made to prior  chest x-ray report of 12/13/2015. Again intra-arterial location of the left IJ line cannot be excluded. This finding was reported verbally to the patient's caregiver on 12/13/2015. Endotracheal tube and NG tube in stable position.  2. Cardiomegaly with persistent bilateral pulmonary infiltrates/pulmonary edema. Similar findings noted on prior exam.   12/19/2015 Little significant change with bibasilar atelectasis/ airspace disease and cardiomegaly again noted. Left-sided vascular catheter as described-correlate clinically.   EEG 11/27/2015 Abnormal EEG in the comatose state demonstrating: 1. Burst suppression pattern (1-10 seconds of burst activity with up to 10-30 seconds of suppression). 2. During the burst activity, there are frequent right frontal-temporal poly-spike and  spike-and-wave discharges. These are associated with myoclonic jerks on video recording.   LTM EEG  12/13/15 This EEG is consistent with diffuse non-specific cerebral dysfunction, with epileptiform discharges in the right hemisphere that improve though the duration of the recording. No electrographic seizures were seen. 12/14/15 This EEG is consistent with diffuse non-specific cerebral dysfunction, with suggestion of cortical irritability in the right hemisphere. No electrographic seizures were seen.   TTE - - Left ventricle: The cavity size was moderately dilated. Wall  thickness was normal. The estimated ejection fraction was 40%.  Diffuse hypokinesis.   Renal artery ultrasound - - Bilateral abnormal intrarenal resistive indices. - No evidence of right renal artery stenosis. - No evidence of left renal artery stenosis.   Mri and Mra Head Wo Contrast 12/17/2015   1. Bilateral cortical edema/infarction and small caudate infarcts compatible with global anoxic injury.  2. Size stable right thalamic and intraventricular hemorrhage. No MRA finding to explain the hematoma.  3. Right frontal shunt with stable ventricular volume and preferential right lateral ventricle drainage.  4. Progressive sinusitis and mastoiditis.     PHYSICAL EXAMINATION  Temp:  [98.8 F (37.1 C)-100.2 F (37.9 C)] 99.1 F (37.3 C) (04/05 1200) Pulse Rate:  [64-86] 79 (04/05 1205) Resp:  [6-30] 20 (04/05 1205) BP: (101-143)/(52-79) 130/76 mmHg (04/05 1205) SpO2:  [97 %-100 %] 100 % (04/05 1205) Arterial Line BP: (106-174)/(50-68) 110/53 mmHg (04/05 1000) FiO2 (%):  [30 %-60 %] 60 % (04/05 1205) Weight:  [249 lb 1.9 oz (113 kg)] 249 lb 1.9 oz (113 kg) (04/05 0449)  General - Well nourished, well developed, intubated and off sedation.  Ophthalmologic - Fundi not visualized due to ET positioning.  Cardiovascular - Regular rhythm and rate.  Neuro - intubated and off sedation, does not respond to voice or  sternal rub. No pupillary reflexes, no dolls eyes, weak corneal bilaterally, with very weak gag. Not withdraw to pain in all extremities. DTR diminished and no babinski. Sensation, coordination and gait not tested. Not breathing over the vent.   ASSESSMENT/PLAN Mr. Leotha Voeltz is a 50 y.o. male with history of chronic kidney disease, community-acquired pneumonia, and hypertension, congestive heart failure, presenting with confusion, dysarthria, and subsequent loss of consciousness. Patient required intubation and went into PEA arrest for 15 min with resuscitation. Found to have a right BG ICH with IVH.  He did not receive IV t-PA due to hemorrhage.  Comatose state due to Global anoxic brain injury due to prolonged cardiac arrest  Cardiac arrest for  MRI consistent with global anoxic brain injury  EEG myoclonic status -> flat lines  Neuro exam showed weak corneal and gag, no papillary or dolls eyes, not breathing over the vent  Poor prognosis  Dr. Pearlean Brownie had long discussion with brother at bedside 12/20/15 regarding the current diagnosis and prognosis including  persistent vegetative state. He would like to have further discussion with family members.   ICH:  right BG ICH with ventricular extension.  CT showed right BG ICH with IVH  Repeat CT head showed stable hematoma but obstructive hydrocephalus  Repeat CT again showed stable hematoma and decreased hydrocephalus after EVD  2D Echo EF 40%, improved from prior.  LDL 60 on 10/29/2015  HgbA1c 5.9  VTE prophylaxis - SCDs and heparin subq Diet NPO time specified  No antithrombotic prior to admission, now on No antithrombotic secondary to hemorrhage.  Ongoing aggressive stroke risk factor management  Disposition - pending  Obstructive hydrocephalus  CT repeat showed above findings  EVD placed by NSG  Repeat CT showed decreased hydrocephalus  MRI showed stable ventricle size  Seizure  Initial stat EEG burst  suppression with myoclonic status  Put on versed drip high dose - weaning off  Put on keppra and depakote  LTM EEG so far showed no seizure but slowing -> flat lines  Maximize keppra to 1500mg  bid  depakote load again and increase to 1250mg  Q8  depakote level 33->48->69->61->51  PRN Ativan  Continue LTM EEG flat lines with versed weaning off - currently off  Cerebral edema  CT repeat showed increase midline shift  D/c mannitol  3% saline on hold  Na Q6h 149->154->157->160->157->159->155 (Sunday)  Na goal 150-155  On free water 200 ml Q4 hrs  Cardiac arrest  Likely related to IVH down to IV ventricle  Resuscitated but with global hypoxic injury  Burst suppression on EEG, resolved on versed -> slowing -> flat lines  NSR now  respiratory failure and aspiration pneumonia  Intubated   CCM on board  On vanco and zosyn   Leukocytosis 17.0->21.2->14.1->11.9->9.1->7.8-> 8.4 (Sunday)  Hypertension  Managed with Cardene PRN  Developed with hypotension and on Neo, now off  BP goal < 160  Resumed home meds coreg, clonidine, hydralazine, and cozaar  Increased hydralazine to 100mg  tid  Increase clonidine to 0.3 mg tid  PRN Labetalol  Fever, resolved  UA negative  BCx NGTD  Urine Cx - no growth  Discuss with NSG about CSF sampling, but unlikely ventriculitis  CXR stable 12/19/2015  On Zosyn (Vanco D/C'd)  Central fever possible  Other Stroke Risk Factors  Cigarette smoker, quit smoking 8 years ago.  Obesity, Body mass index is 32.87 kg/(m^2).   Other Active Problems  Cardiomyopathy EF 40%   Chronic kidney disease, Cre 2.41->2.26->1.78 ->1.36-> 1.59 (Sunday)  Comatose state s/p hypoxic injury from PEA cardiac arrest  Anemia    (Hb - 10.4)    (Hct - 35) (Sunday)  Calcium - 8.8      Mg - 2.5  Hospital day # 10  This patient is critically ill due to right ICH and IVH, hydrocephalus, seizure, cardiac arrest, cerebral edema and at  significant risk of neurological worsening, death form recurrent bleeding, brain herniation, heart failure, cardiac arrest, hydrocephalus. This patient's care requires constant monitoring of vital signs, hemodynamics, respiratory and cardiac monitoring, review of multiple databases, neurological assessment, discussion with family, other specialists and medical decision making of high complexity. I spent 32 minutes of neurocritical care time in the care of this patient discussed with Dr. Molli KnockYacoub. Patient's family insist on prolonged ventilatory support, tracheostomy and PEG tube even though the patient is likely going to be in persistent vegetative state.. We need to have neurosurgery address the issue of continuing ventriculostomy drainage versus VP shunt Personally examined patient and images, and have  participated in and made any corrections needed to history, physical, neuro exam,assessment and plan as stated above.  I have personally obtained the history, evaluated lab date, reviewed imaging studies and agree with radiology interpretations.     Delia Heady MD Stroke Neurology     To contact Stroke Continuity provider, please refer to [email or web address]. After hours, contact General Neurology

## 2015-12-22 NOTE — Progress Notes (Signed)
Bedside EEG completed, results pending. 

## 2015-12-22 NOTE — Procedures (Signed)
Bronchoscopy  for Percutaneous  Tracheostomy  Name: Austin Mccarty MRN: [id number] DOB: 12/13/1965 Procedure: Bronchoscopy for Percutaneous Tracheostomy Indications: Bedside percutaneous tracheostomy  In conjunction with: Dr. Molli KnockYacoub  Procedure Details Consent: Risks of procedure as well as the alternatives and risks of each were explained to the (patient/caregiver).  Consent for procedure obtained. Time Out: Verified patient identification, verified procedure, site/side was marked, verified correct patient position, special equipment/implants available, medications/allergies/relevent history reviewed, required imaging and test results available.  Performed  In preparation for procedure, patient was given 100% FiO2 and bronchoscope lubricated. Sedation: Benzodiazepines, Muscle relaxants, Etomidate and Short-acting barbiturates  Airway entered and the following bronchi were examined: trachea and carina    Procedures performed: Endotracheal Tube retracted in 2 cm increments. Cannulation of airway observed. Dilation observed. Placement of trachel tube  observed . No overt complications. Bronchoscope removed.    Evaluation Hemodynamic Status: BP stable throughout; O2 sats: stable throughout Patient's Current Condition: stable Specimens:  None Complications: No apparent complications Patient did tolerate procedure well.   Brett CanalesSteve Minor ACNP [facility] Pager [phone] till 3 pm If no answer page [phone] 12/22/2015, 10:50 AM  Alyson ReedyWesam G. Yacoub, M.D. [facility]Critical [facility]. Pager: [phone]. After hours pager: [phone].

## 2015-12-22 NOTE — Clinical Social Work Note (Signed)
Clinical Social Worker continuing to follow patient and family for support and discharge planning needs.  Patient brother requested to speak with CSW - CSW received permission from patient wife to communicate with patient brother.  Patient brother requesting for documentation for the consulate to have patient sister come to the states and care for patient children.  Patient brother also requesting in state SNF placement for patient due to his family dynamic and family inability to financial afford a long distance placement.  CSW explained the potential barriers for patient to obtain in state placement.  Patient brother verbalizes understanding, however does not show signs of agreement.  CSW remains available for support and to assist patient family as needed.  Macario GoldsJesse Bernice Mcauliffe, [place] [phone]

## 2015-12-22 NOTE — Procedures (Signed)
Percutaneous Tracheostomy Placement  Consent from family.  Patient sedated, paralyzed and position.  Placed on 100% FiO2 and RR matched.  Area cleaned and draped.  Lidocaine/epi injected.  Skin incision done followed by blunt dissection.  Trachea palpated then punctured, catheter passed and visualized bronchoscopically.  Wire placed and visualized.  Catheter removed.  Airway then crushed and dilated.  Size 8 cuffed shiley trach placed and visualized bronchoscopically well above carina.  Good volume returns.  Patient tolerated the procedure well without complications.  Minimal blood loss.  CXR ordered and pending.  Wesam G. Yacoub, M.D. [place] Pulmonary/Critical Care Medicine. Pager: [phone]. After hours pager: [phone].  

## 2015-12-22 NOTE — Progress Notes (Signed)
PULMONARY / CRITICAL CARE MEDICINE   Name: Austin Mccarty MRN: [id number] DOB: Jun 01, 1966    ADMISSION DATE:  12/11/2015  CHIEF COMPLAINT:  PEA arrest  HISTORY OF PRESENT ILLNESS:   50 yo male with h/o HTN, HFrEF, CKD, DCM 2/2 HTN who presented 3/26 from home with loss of consciousness.  Marland Kitchen  He arrived in the ED with sats in the 60's, emergently intubated then PEA arrest x92mn then ROSC.  He was found to have a large right sided ICH with intraventricular hemorrhage likely 2/2 HTN.  SUBJECTIVE: Off sedation, no seizure and remains unresponsive.  Respiratory drive intact.  VITAL SIGNS: Temp:  [98.8 F (37.1 C)-100.2 F (37.9 C)] 99.4 F (37.4 C) (04/05 0800) Pulse Rate:  [64-86] 71 (04/05 0800) Resp:  [20-30] 20 (04/05 0800) BP: (101-143)/(51-79) 114/59 mmHg (04/05 0800) SpO2:  [95 %-100 %] 98 % (04/05 0800) Arterial Line BP: (105-174)/(48-68) 113/53 mmHg (04/05 0800) FiO2 (%):  [30 %] 30 % (04/05 0800) Weight:  [113 kg (249 lb 1.9 oz)] 113 kg (249 lb 1.9 oz) (04/05 0449) HEMODYNAMICS:   VENTILATOR SETTINGS: Vent Mode:  [-] PRVC FiO2 (%):  [30 %] 30 % Set Rate:  [20 bmp] 20 bmp Vt Set:  [550 mL] 550 mL PEEP:  [5 cmH20] 5 cmH20 Plateau Pressure:  [19 cmH20-22 cmH20] 19 cmH20 INTAKE / OUTPUT:  Intake/Output Summary (Last 24 hours) at 12/22/15 0939 Last data filed at 12/22/15 0900  Gross per 24 hour  Intake   1275 ml  Output   2139 ml  Net   -864 ml   PHYSICAL EXAMINATION: General:  Unresponsive. Ventilated, acutely ill.  Unresponsive off sedation. Integument:  Warm & dry. No rash on exposed skin.  HEENT:  No scleral injection or icterus. Endotracheal tube in place.  Cardiovascular:  Regular rate. Normal S1 & S2. No appreciable JVD.  Pulmonary:  Distant breath sounds bilaterally. Symmetric chest wall rise on ventilator. Abdomen: Soft. Normal bowel sounds. Nondistended.  Neurological:  Unresponsive. Pupils equal. No withdrawal to pain in  extremities.  LABS:  CBC  Recent Labs Lab 12/19/15 0440 12/21/15 0344 12/22/15 0438  WBC 8.4 8.6 7.4  HGB 10.4* 10.5* 9.8*  HCT 35.0* 33.4* 30.9*  PLT 177 175 173   Coag's No results for input(s): APTT, INR in the last 168 hours. BMET  Recent Labs Lab 12/19/15 0440  12/21/15 0344  12/21/15 1717 12/21/15 2212 12/22/15 0438  NA 155*  < > 155*  < > 152* 150* 155*  K 3.8  --  3.7  --   --   --  3.7  CL 118*  --  115*  --   --   --  114*  CO2 27  --  28  --   --   --  27  BUN 42*  --  36*  --   --   --  44*  CREATININE 1.59*  --  1.48*  --   --   --  1.58*  GLUCOSE 160*  --  141*  --   --   --  108*  < > = values in this interval not displayed. Electrolytes  Recent Labs Lab 12/19/15 0440 12/21/15 0344 12/22/15 0438  CALCIUM 8.8* 8.8* 8.6*  MG 2.5* 2.3 2.3  PHOS 4.1 3.6 4.1   Sepsis Markers No results for input(s): LATICACIDVEN, PROCALCITON, O2SATVEN in the last 168 hours. ABG  Recent Labs Lab 12/18/15 0340 12/19/15 0425 12/21/15 0420  PHART 7.448 7.462* 7.446  PCO2ART 43.3 41.8 42.1  PO2ART 78.5* 84.5 92.9   Liver Enzymes No results for input(s): AST, ALT, ALKPHOS, BILITOT, ALBUMIN in the last 168 hours. Cardiac Enzymes No results for input(s): TROPONINI, PROBNP in the last 168 hours. Glucose  Recent Labs Lab 12/21/15 1216 12/21/15 1532 12/21/15 1941 12/21/15 2323 12/22/15 0339 12/22/15 0816  GLUCAP 127* 125* 122* 126* 114* 113*   Imaging Dg Chest Port 1 View  12/22/2015  CLINICAL DATA:  History of endotracheal tube EXAM: PORTABLE CHEST 1 VIEW COMPARISON:  12/21/2015 FINDINGS: Endotracheal tube is barely visualized on this image and appears to be high in the trachea. Endotracheal tube approximately 11 cm above the carina. Recommend advancing endotracheal tube approximately 6 cm. Central venous catheter in the left neck. Catheter tip is to the left of midline. There is a persistent kink in the catheter at the level of the aortic arch. This  catheter may be within the aortic arch versus a branch of the internal mammary vein. This is unchanged from prior studies. Correlation with blood gas values from this catheter is suggested to exclude arterial placement. NG tube in the stomach. Mild bibasilar atelectasis unchanged from yesterday. IMPRESSION: Bibasilar atelectasis unchanged Endotracheal tube is high approximately 11 cm above the carina. Recommend advancing endotracheal tube approximately 6 cm. Left neck catheter may be arterial in location. This is unchanged from prior studies. Electronically Signed   By: Franchot Gallo M.D.   On: 12/22/2015 07:54   EVENTS: 3/26 - cardiac arrest 3/27 - high dose versed for seizures 3/27 - ventriculostomy  STUDIES: TTE (10/29/15):  EF 30-35% w/ diffuse hypokinesis. No AS or AR. Mild MR but no MS. RV moderately dilated w/ normal systolic function. LA severely dilated & RA mildly dilated. CT HEAD 2:19AM 3/26:  R Thalamic hemorrhage as well as IVH w/ mild mass effect & 3m right to left midline shift. Possible mild diffuse cerebral edema. Echo 3/27 EF 40% MRI 3/31>>>Diffuse brain damage.  MICROBIOLOGY: Blood Ctx x2 3/26>>> Urine Ctx 3/26>>> Tracheal Asp Ctx 3/26>>> Influenza PCR 3/26>>> Respiratory Viral Panel PCR 3/26>>> Urine Strep Ag 3/26>>> Urine Legionella Ag 3/26>>> MRSA PCR 3/26:  Negative   ANTIBIOTICS: Vancomycin 3/26>>>3/31 Zosyn 3/26>>>4/4  LINES/TUBES: OETT 7.5 3/26>>> OGT 3/26>>> FOLEY 3/26>>> 3/27 vstomy >>  ASSESSMENT / PLAN:  NEUROLOGIC A:   Right sided ICH - Sub-falcine herniation. Intraventricular Hemorrhage Status epilepticus UDS neg IVD in place.  Output of 10-12 cc/hr. P:   Neurology following. MRI diffuse brain injury, anoxic. D/Ced all sedation. Keppra & valproate IV. SBP goal 120-140. ?VP shunt, if no VP shunt and it is needed per neurosurg then will not trach.  PULMONARY A: Acute Hypoxic/ hypercarbic  Respiratory Failure - Secondary to pulmonary  edema. Pulmonary Edema - Cardiogenic vs Neurogenic  P:   Full Vent Support, failed weaning with high RR and low Tv. Titrate O2 for sat of 88-92%. Adjust vent for ABG. Will leave ABG as PRN at this point. No weaning given mental status.  CARDIOVASCULAR A:  Possible Acute Systolic CHF Exacerbation - EF 30-35% 10/2015. Hypertensive Emergency  P:  Cardene gtt to maintain SBP 120-140, now off. Hold anti-HTN for now.  RENAL A:   Acute on Chronic Renal Failure - likely hypervolemic/cardiorenal syndrome. Lactic Acidosis - Resolving. Hyponatremia on admit, now on 3% saline  P:   Monitoring UOP with Foley. Trending electrolytes & renal function daily. Replace electrolytes as indicated. Free water per neuro.  GASTROINTESTINAL A:   No acute issues.  P:  Protonix IV daily. Continue TFs. ?PEG need.  HEMATOLOGIC A:   Leukocytosis - Sepsis vs Stress Induced.  P:  Trending cell counts daily w/ CBC. SCDs.  INFECTIOUS A:   Possible CAP Possible UTI Fever overnight without evidence of infection.  P:   D/C Zosyn. Procalcitonin per algorithm noted, elevated but dropping.  ENDOCRINE A:   Hyperglycemia - No h/o DM.  P:   Accu-Checks q4hr. SSI per algorithm.  FAMILY UPDATES:  Met with ethics committee, due to religious reasons was going to proceed with trach/peg, however, output from IVD is too high, will speak with NS, if without a shunt patient will die then will not perform a trach.  To be discussed with NS after Dr. Arneta Cliche is out of surgery.  The patient is critically ill with multiple organ systems failure and requires high complexity decision making for assessment and support, frequent evaluation and titration of therapies, application of advanced monitoring technologies and extensive interpretation of multiple databases.   Critical Care Time devoted to patient care services described in this note is  45  Minutes. This time reflects time of care of this signee Dr  Jennet Maduro. This critical care time does not reflect procedure time, or teaching time or supervisory time of PA/NP/Med student/Med Resident etc but could involve care discussion time.  Rush Farmer, M.D. [facility] Pulmonary/Critical Care Medicine. Pager: [phone]. After hours pager: [phone].  12/22/2015, 9:39 AM

## 2015-12-22 NOTE — Progress Notes (Signed)
Patient ID: Maudry DiegoMohialdin H. Rock, male   DOB: 04/05/1966, 50 y.o.   MRN: [id number] BP 164/77 mmHg  Pulse 73  Temp(Src) 98.5 F (36.9 C) (Axillary)  Resp 20  Ht 6\' 1"  (1.854 m)  Wt 113 kg (249 lb 1.9 oz)  BMI 32.87 kg/m2  SpO2 98% Unresponsive. Opens eyes Coma vigil Head ct shows collapsed ventricles. Will clamp catheter, recheck ct tomorrow in anticipation of removing the catheter.

## 2015-12-23 ENCOUNTER — Inpatient Hospital Stay (HOSPITAL_COMMUNITY): Payer: Medicaid Other

## 2015-12-23 ENCOUNTER — Encounter (HOSPITAL_COMMUNITY): Admission: EM | Disposition: E | Payer: Self-pay | Source: Home / Self Care | Attending: Pulmonary Disease

## 2015-12-23 DIAGNOSIS — J9600 Acute respiratory failure, unspecified whether with hypoxia or hypercapnia: Secondary | ICD-10-CM | POA: Insufficient documentation

## 2015-12-23 DIAGNOSIS — J9602 Acute respiratory failure with hypercapnia: Secondary | ICD-10-CM

## 2015-12-23 DIAGNOSIS — J9622 Acute and chronic respiratory failure with hypercapnia: Secondary | ICD-10-CM

## 2015-12-23 DIAGNOSIS — J9621 Acute and chronic respiratory failure with hypoxia: Secondary | ICD-10-CM | POA: Insufficient documentation

## 2015-12-23 HISTORY — PX: PEG PLACEMENT: SHX5437

## 2015-12-23 HISTORY — PX: ESOPHAGOGASTRODUODENOSCOPY: SHX5428

## 2015-12-23 LAB — BASIC METABOLIC PANEL
ANION GAP: 14 (ref 5–15)
BUN: 33 mg/dL — ABNORMAL HIGH (ref 6–20)
CALCIUM: 9 mg/dL (ref 8.9–10.3)
CHLORIDE: 111 mmol/L (ref 101–111)
CO2: 28 mmol/L (ref 22–32)
CREATININE: 1.11 mg/dL (ref 0.61–1.24)
Glucose, Bld: 131 mg/dL — ABNORMAL HIGH (ref 65–99)
Potassium: 3.9 mmol/L (ref 3.5–5.1)
SODIUM: 153 mmol/L — AB (ref 135–145)

## 2015-12-23 LAB — CBC
HCT: 39.9 % (ref 39.0–52.0)
HEMOGLOBIN: 12.5 g/dL — AB (ref 13.0–17.0)
MCH: 22.8 pg — ABNORMAL LOW (ref 26.0–34.0)
MCHC: 31.3 g/dL (ref 30.0–36.0)
MCV: 72.7 fL — ABNORMAL LOW (ref 78.0–100.0)
Platelets: 225 10*3/uL (ref 150–400)
RBC: 5.49 MIL/uL (ref 4.22–5.81)
RDW: 20.4 % — ABNORMAL HIGH (ref 11.5–15.5)
WBC: 8.1 10*3/uL (ref 4.0–10.5)

## 2015-12-23 LAB — GLUCOSE, CAPILLARY
GLUCOSE-CAPILLARY: 110 mg/dL — AB (ref 65–99)
GLUCOSE-CAPILLARY: 102 mg/dL — AB (ref 65–99)
Glucose-Capillary: 122 mg/dL — ABNORMAL HIGH (ref 65–99)
Glucose-Capillary: 103 mg/dL — ABNORMAL HIGH (ref 65–99)

## 2015-12-23 LAB — MAGNESIUM: MAGNESIUM: 2.4 mg/dL (ref 1.7–2.4)

## 2015-12-23 LAB — PHOSPHORUS: Phosphorus: 4.4 mg/dL (ref 2.5–4.6)

## 2015-12-23 SURGERY — EGD (ESOPHAGOGASTRODUODENOSCOPY)
Anesthesia: Moderate Sedation

## 2015-12-23 MED ORDER — VECURONIUM BROMIDE 10 MG IV SOLR
INTRAVENOUS | Status: AC
  Filled 2015-12-23: qty 10

## 2015-12-23 MED ORDER — CLONIDINE HCL 0.3 MG/24HR TD PTWK
0.3000 mg | MEDICATED_PATCH | TRANSDERMAL | Status: DC
  Administered 2015-12-23: 0.3 mg via TRANSDERMAL
  Filled 2015-12-23: qty 1

## 2015-12-23 MED ORDER — MIDAZOLAM HCL 2 MG/2ML IJ SOLN
INTRAMUSCULAR | Status: AC
  Filled 2015-12-23: qty 4

## 2015-12-23 MED ORDER — MIDAZOLAM HCL 2 MG/2ML IJ SOLN
2.0000 mg | Freq: Once | INTRAMUSCULAR | Status: AC
  Administered 2015-12-23: 2 mg via INTRAVENOUS

## 2015-12-23 MED ORDER — VALPROATE SODIUM 250 MG/5ML PO SYRP
1250.0000 mg | ORAL_SOLUTION | Freq: Three times a day (TID) | ORAL | Status: DC
Start: 2015-12-23 — End: 2015-12-27
  Administered 2015-12-23 – 2015-12-27 (×12): 1250 mg
  Filled 2015-12-23 (×15): qty 30

## 2015-12-23 MED ORDER — VECURONIUM BROMIDE 10 MG IV SOLR
10.0000 mg | Freq: Once | INTRAVENOUS | Status: AC
  Administered 2015-12-23: 10 mg via INTRAVENOUS

## 2015-12-23 MED ORDER — FREE WATER
300.0000 mL | Status: DC
  Administered 2015-12-23 – 2015-12-26 (×13): 300 mL

## 2015-12-23 MED ORDER — LEVETIRACETAM 100 MG/ML PO SOLN
1500.0000 mg | Freq: Two times a day (BID) | ORAL | Status: DC
Start: 2015-12-23 — End: 2015-12-27
  Administered 2015-12-23 – 2015-12-26 (×7): 1500 mg
  Filled 2015-12-23 (×8): qty 15

## 2015-12-23 NOTE — Op Note (Signed)
[facility] Patient Name: Austin Mccarty Procedure Date : 12/21/2015 MRN: [id number] Attending MD: Violeta Gelinas , MD Date of Birth: Mar 03, 1966 CSN: [id number] Age: 50 Admit Type: Inpatient Procedure:                Upper GI endoscopy Indications:              Place PEG because patient is unable to eat due to                            stroke (CVA) Providers:                Violeta Gelinas, MD, Darletta Moll, Technician, Clearnce Sorrel, Technician Referring MD:              Medicines:                Midazolam 2 mg IV, Fentanyl 100 micrograms IV,                            rocuronium , on scheduled antibiotic protocol Complications:            No immediate complications. Estimated Blood Loss:     Estimated blood loss was minimal. Procedure:                After obtaining informed consent, the endoscope was                            passed under direct vision. Scope was inserted via                            the mouth upecond portion of the duodenum.                            Throughout the procedure, the patient's blood                            pressure, pulse, and oxygen saturations were                            monitored continuously. The EG-[id number] ([id number])                            scope was introduced through the mouth, and                            advanced to the antrum of the stomach. The upper GI                            endoscopy was accomplished without difficulty. The                            patient tolerated the procedure well. Scope In: Scope Out: Findings:      The esophagus was normal.      The  entire examined stomach was normal. Placement of an externally       removable PEG with no T-fasteners was successfully completed. The       external bumper was at the 4.0 cm marking on the tube. Estimated blood       loss was minimal.      The examined duodenum was normal.      The cardia and gastric fundus were normal  on retroflexion. Impression:               - Normal esophagus.                           - Normal stomach.                           - Normal examined duodenum.                           - An externally removable PEG placement was                            successfully completed.                           - No specimens collected. Moderate Sedation:      Moderate (conscious) sedation was personally administered by the       endoscopist. The following parameters were monitored: oxygen saturation,       heart rate, blood pressure, respiratory rate, EKG, adequacy of pulmonary       ventilation, and response to care. Recommendation:           - Please follow the post-PEG recommendations                            including: meds OK via PEG today, will start TF                            tomorrow. Procedure Code(s):        --- Professional ---                           [id number], 52, Esophagogastroduodenoscopy, flexible,                            transoral; with directed placement of percutaneous                            gastrostomy tube Diagnosis Code(s):        --- Professional ---                           [id number], Other sequelae of cerebral infarction                           R63.3, Feeding difficulties                           Z43.1, Encounter for attention to gastrostomy CPT copyright 2016 [employer]. All rights  reserved. The codes documented in this report are preliminary and upon coder review may  be revised to meet current compliance requirements. Violeta Gelinas, MD 01/12/16 11:02:03 AM This report has been signed electronically. Number of Addenda: 0

## 2015-12-23 NOTE — Procedures (Signed)
History: Austin Mccarty is an 50 y.o. male patient with anoxic encephalopathy. Routine inpatient EEG was performed for further evaluation.   Patient Active Problem List   Diagnosis Date Noted  . Anoxic brain damage (HCC)   . Acute and chronic respiratory failure, unspecified whether with hypoxia or hypercapnia (HCC) 12/15/2015  . Intracranial hemorrhage (HCC)   . Intraparenchymal hematoma of brain (HCC) 11/19/2015  . Intracerebral hemorrhage 11/26/2015  . Cardiac arrest (HCC)   . Malignant hypertension   . Diabetes mellitus (HCC) 11/15/2015  . Cough   . DOE (dyspnea on exertion)   . Peripheral edema   . Hypokalemia 10/28/2015  . HCAP (healthcare-associated pneumonia) 10/28/2015  . Systolic CHF, acute (HCC) 10/27/2015  . Essential hypertension 10/27/2015  . Hyponatremia 10/27/2015  . Anemia 10/27/2015  . Chronic kidney disease 10/08/2015  . Acute respiratory failure with hypoxia (HCC) 10/04/2015  . Systolic CHF, acute on chronic (HCC) 10/04/2015  . Acute bronchitis 10/04/2015  . Hypertension, uncontrolled 10/04/2015  . CHF (congestive heart failure), NYHA class I (HCC)   . CHF (congestive heart failure) (HCC) 10/03/2015  . Hypertensive heart disease 01/27/2015  . Chronic combined systolic and diastolic congestive heart failure (HCC) 01/12/2015  . Accelerated hypertension 01/12/2015  . Mediastinal lymphadenopathy 01/12/2015  . CAP (community acquired pneumonia) 01/08/2015     Current facility-administered medications:  .  0.9 %  sodium chloride infusion, 250 mL, Intravenous, PRN, Cleone Slim, MD .  Place/Maintain arterial line, , , Until Discontinued **AND** 0.9 %  sodium chloride infusion, , Intra-arterial, PRN, Roslynn Amble, MD .  acetaminophen (TYLENOL) solution 650 mg, 650 mg, Per Tube, Q6H PRN, Roslynn Amble, MD, 650 mg at 12/15/15 1224 .  antiseptic oral rinse solution (CORINZ), 7 mL, Mouth Rinse, 10 times per day, Roslynn Amble, MD, 7 mL at 01/01/2016  0605 .  carvedilol (COREG) tablet 25 mg, 25 mg, Oral, BID WC, Marvel Plan, MD, 25 mg at 12/22/15 0818 .  chlorhexidine gluconate (SAGE KIT) (PERIDEX) 0.12 % solution 15 mL, 15 mL, Mouth Rinse, BID, Roslynn Amble, MD, 15 mL at 12/22/15 2010 .  cloNIDine (CATAPRES) tablet 0.3 mg, 0.3 mg, Oral, 3 times per day, Marvel Plan, MD, 0.3 mg at 12/22/15 0616 .  docusate (COLACE) 50 MG/5ML liquid 200 mg, 200 mg, Per Tube, BID, Alyson Reedy, MD, Stopped at 12/22/15 1026 .  feeding supplement (PRO-STAT SUGAR FREE 64) liquid 30 mL, 30 mL, Per Tube, 5 X Daily, Marvel Plan, MD, Stopped at 12/22/15 1026 .  feeding supplement (VITAL HIGH PROTEIN) liquid 1,000 mL, 1,000 mL, Per Tube, Continuous, Marvel Plan, MD, Stopped at 12/22/15 0000 .  fentaNYL (SUBLIMAZE) injection 25-100 mcg, 25-100 mcg, Intravenous, Q1H PRN, Nelda Bucks, MD, 100 mcg at 01/08/2016 0177 .  free water 200 mL, 200 mL, Per Tube, Q4H, Marvel Plan, MD, Stopped at 12/22/15 1026 .  hydrALAZINE (APRESOLINE) injection 10-40 mg, 10-40 mg, Intravenous, Q4H PRN, Cyril Mourning V, MD, 40 mg at 12/26/2015 0505 .  hydrALAZINE (APRESOLINE) tablet 100 mg, 100 mg, Oral, TID, Marvel Plan, MD, 100 mg at 12/22/15 1023 .  labetalol (NORMODYNE,TRANDATE) injection 10-20 mg, 10-20 mg, Intravenous, Q10 min PRN, Rejeana Brock, MD, 20 mg at 12/22/15 2105 .  levETIRAcetam (KEPPRA) 1,500 mg in sodium chloride 0.9 % 100 mL IVPB, 1,500 mg, Intravenous, Q12H, Herby Abraham, RPH, 1,500 mg at 12/22/15 2106 .  LORazepam (ATIVAN) injection 1-4 mg, 1-4 mg, Intravenous, Q1H PRN, Roslynn Amble, MD, 2  mg at 12/16/15 0039 .  losartan (COZAAR) tablet 100 mg, 100 mg, Oral, Daily, Rosalin Hawking, MD, 100 mg at 12/22/15 1023 .  pantoprazole sodium (PROTONIX) 40 mg/20 mL oral suspension 40 mg, 40 mg, Per Tube, Q1200, Kara Mead V, MD, 40 mg at 12/21/15 1310 .  valproate (DEPACON) 1,250 mg in dextrose 5 % 50 mL IVPB, 1,250 mg, Intravenous, 3 times per day, Eudelia Bunch, RPH, 1,250  mg at 01/06/2016 4627   Introduction:  This is a 19 channel routine scalp EEG performed at the bedside with bipolar and monopolar montages arranged in accordance to the international 10/20 system of electrode placement. One channel was dedicated to EKG recording.   Findings:  Severe generalized suppression of the background amplitude is noted, with some subtle poorly defined low amplitude polymorphic delta activity noted with high sensitivity setting, with no clear background rhythm.  Blink artifacts are noted  No definite evidence of abnormal epileptiform discharges or electrographic seizures were noted during this recording.   Impression:   Abnormal routine inpatient EEG suggestive of severe anoxic ischemic encephalopathy based on severe background suppression as described. Clinical correlation is recommended .

## 2015-12-23 NOTE — Progress Notes (Signed)
Patient ID: Austin Mccarty, male   DOB: 11/25/1965, 50 y.o.   MRN: [id number] For bedside PEG today. Consent obtained from his older brother. Violeta GelinasBurke Magda Muise, MD, MPH, FACS Trauma: [phone] General Surgery: [phone]

## 2015-12-23 NOTE — Progress Notes (Signed)
PULMONARY / CRITICAL CARE MEDICINE   Name: Matthewjames Petrasek MRN: [id number] DOB: 22-Nov-1965    ADMISSION DATE:  2016-01-04  CHIEF COMPLAINT:  PEA arrest  HISTORY OF PRESENT ILLNESS:   50 yo male with h/o HTN, HFrEF, CKD, DCM 2/2 HTN who presented 3/26 from home with loss of consciousness.  Marland Kitchen  He arrived in the ED with sats in the 60's, emergently intubated then PEA arrest x83min then ROSC.  He was found to have a large right sided ICH with intraventricular hemorrhage likely 2/2 HTN.  SUBJECTIVE: trach , on vent, drain in brain remains, neg 1.4 liters  VITAL SIGNS: Temp:  [97.1 F (36.2 C)-99.1 F (37.3 C)] 97.1 F (36.2 C) (04/06 0800) Pulse Rate:  [63-83] 80 (04/06 0735) Resp:  [6-24] 24 (04/06 0735) BP: (101-210)/(55-98) 167/84 mmHg (04/06 0735) SpO2:  [97 %-100 %] 100 % (04/06 0735) Arterial Line BP: (99-155)/(50-67) 155/67 mmHg (04/05 1500) FiO2 (%):  [30 %-60 %] 30 % (04/06 0735) Weight:  [109.6 kg (241 lb 10 oz)] 109.6 kg (241 lb 10 oz) (04/06 0500) HEMODYNAMICS:   VENTILATOR SETTINGS: Vent Mode:  [-] PSV;CPAP FiO2 (%):  [30 %-60 %] 30 % Set Rate:  [20 bmp] 20 bmp Vt Set:  [550 mL] 550 mL PEEP:  [5 cmH20] 5 cmH20 Pressure Support:  [10 cmH20] 10 cmH20 Plateau Pressure:  [19 cmH20-22 cmH20] 20 cmH20 INTAKE / OUTPUT:  Intake/Output Summary (Last 24 hours) at 01/02/2016 0848 Last data filed at 01/11/2016 0700  Gross per 24 hour  Intake  497.5 ml  Output   2572 ml  Net -2074.5 ml   PHYSICAL EXAMINATION: General:  Unresponsive, spont eye opening Integument:  Warm & dry. No rash on exposed skin.  HEENT:  Trach clean Cardiovascular:  s1 s2 RRR Pulmonary:  Anterior clear, secretions present Abdomen: Soft. Normal bowel sounds. Nondistended.  Neurological:  Unresponsive. Pupils equal. No withdrawal to pain in extremities.  LABS:  CBC  Recent Labs Lab 12/19/15 0440 12/21/15 0344 12/22/15 0438  WBC 8.4 8.6 7.4  HGB 10.4* 10.5* 9.8*  HCT 35.0* 33.4* 30.9*  PLT  177 175 173   Coag's No results for input(s): APTT, INR in the last 168 hours. BMET  Recent Labs Lab 12/19/15 0440  12/21/15 0344  12/21/15 1717 12/21/15 2212 12/22/15 0438  NA 155*  < > 155*  < > 152* 150* 155*  K 3.8  --  3.7  --   --   --  3.7  CL 118*  --  115*  --   --   --  114*  CO2 27  --  28  --   --   --  27  BUN 42*  --  36*  --   --   --  44*  CREATININE 1.59*  --  1.48*  --   --   --  1.58*  GLUCOSE 160*  --  141*  --   --   --  108*  < > = values in this interval not displayed. Electrolytes  Recent Labs Lab 12/19/15 0440 12/21/15 0344 12/22/15 0438  CALCIUM 8.8* 8.8* 8.6*  MG 2.5* 2.3 2.3  PHOS 4.1 3.6 4.1   Sepsis Markers No results for input(s): LATICACIDVEN, PROCALCITON, O2SATVEN in the last 168 hours. ABG  Recent Labs Lab 12/18/15 0340 12/19/15 0425 12/21/15 0420  PHART 7.448 7.462* 7.446  PCO2ART 43.3 41.8 42.1  PO2ART 78.5* 84.5 92.9   Liver Enzymes No results for input(s): AST, ALT,  ALKPHOS, BILITOT, ALBUMIN in the last 168 hours. Cardiac Enzymes No results for input(s): TROPONINI, PROBNP in the last 168 hours. Glucose  Recent Labs Lab 12/22/15 1204 12/22/15 1545 12/22/15 1945 12/22/15 2310 12/26/2015 0508 12/29/2015 0740  GLUCAP 105* 109* 126* 151* 110* 122*   Imaging Ct Head Wo Contrast  12/22/2015  CLINICAL DATA:  Intra cerebral hemorrhage.  A knock 6 brain injury. EXAM: CT HEAD WITHOUT CONTRAST TECHNIQUE: Contiguous axial images were obtained from the base of the skull through the vertex without intravenous contrast. COMPARISON:  Head MRI 12/17/2015 and CT 12/15/2015 FINDINGS: A right frontal approach ventriculostomy catheter remains in place and terminates in the midline just below the inferior horns, unchanged. Right thalamic hematoma measures 4.3 x 2.7 cm in greatest dimensions. Direct comparison with the prior CT is limited due to differences in scan plane, however the hematoma is subjectively unchanged in size. Surrounding edema  also appears unchanged. Midline shift at the level of the thalami is 7 mm, not significantly changed. The lateral ventricles have slightly decreased in size. Hemorrhage in the lateral ventricles is similar in volume to the prior CT. Third ventricular blood products are less conspicuous. Diffuse sulcal effacement in partial effacement of the basilar cisterns are unchanged. There is slightly better (though still abnormal) differentiation of the basal ganglia compared to the prior CT. There is no evidence of new intracranial hemorrhage or extra-axial fluid collection. Orbits are unremarkable. There is near complete opacification throughout the paranasal sinuses. Moderate bilateral mastoid effusions are noted, and there is fluid in the pharynx. IMPRESSION: 1. Minimally decreased size of the ventricles. 2. Right thalamic hemorrhage and surrounding edema without significant interval change. Stable to slightly decreased intraventricular hemorrhage. 3. Brain edema consistent with anoxic injury as previously described. Electronically Signed   By: Sebastian AcheAllen  Grady M.D.   On: 12/22/2015 14:57   Dg Chest Port 1 View  12/22/2015  CLINICAL DATA:  Respiratory failure.  Tracheostomy placement EXAM: PORTABLE CHEST 1 VIEW COMPARISON:  Study obtained earlier in the day FINDINGS: There is no a tracheostomy catheter present with the tracheostomy catheter tip 6.6 cm above the carina. The previously noted left jugular catheter tip remains to the left of midline overlying the descending thoracic aorta. The exact location of this catheter is uncertain. It probably resides in a left internal mammary vein. A kink in the catheter at the aortic arch level remains. Nasogastric tube no longer present. There is trace interstitial edema with small pleural effusions bilaterally. There is cardiomegaly with pulmonary venous hypertension. No adenopathy. IMPRESSION: Evidence of a degree of congestive heart failure. No airspace consolidation. Tracheostomy  catheter now present. No pneumothorax. No change in left sided central catheter placement. Kink in the catheter at the level of the aortic arch is again noted. Exact location of the tip of this catheter is uncertain. Please see above and discussion from prior studies. Electronically Signed   By: Bretta BangWilliam  Woodruff III M.D.   On: 12/22/2015 12:28   EVENTS: 3/26 - cardiac arrest 3/27 - high dose versed for seizures 3/27 - ventriculostomy 4/5- trach placed, drain clamped  STUDIES: TTE (10/29/15):  EF 30-35% w/ diffuse hypokinesis. No AS or AR. Mild MR but no MS. RV moderately dilated w/ normal systolic function. LA severely dilated & RA mildly dilated. CT HEAD 2:19AM 3/26:  R Thalamic hemorrhage as well as IVH w/ mild mass effect & 2mm right to left midline shift. Possible mild diffuse cerebral edema. Echo 3/27 EF 40% MRI 3/31>>>Diffuse brain damage.  eeg 4/5>>>Abnormal routine inpatient EEG suggestive of severe anoxic ischemic encephalopathy based on severe background suppression as described CT head 4/6>>>  MICROBIOLOGY: Blood Ctx x2 3/26>>> Urine Ctx 3/26>>> Tracheal Asp Ctx 3/26>>> Influenza PCR 3/26>>> Respiratory Viral Panel PCR 3/26>>> Urine Strep Ag 3/26>>> Urine Legionella Ag 3/26>>> MRSA PCR 3/26:  Negative   ANTIBIOTICS: Vancomycin 3/26>>>3/31 Zosyn 3/26>>>4/4  LINES/TUBES: OETT 7.5 3/26>>>4/6 Trach (jy) 4/6>>> OGT 3/26>>> FOLEY 3/26>>> 3/27 vstomy >>  ASSESSMENT / PLAN:  NEUROLOGIC A:   Right sided ICH - Sub-falcine herniation. Intraventricular Hemorrhage Status epilepticus UDS neg IVD in place.  Output of 10-12 cc/hr. P:   For clamp drain then ct Keppra & valproate IV. eeg reviewed  PULMONARY A: Acute Hypoxic/ hypercarbic  Respiratory Failure - Secondary to pulmonary edema. Pulmonary Edema - Cardiogenic vs Neurogenic  P:   Increase at x after trach<?, repeat pcxr in am  Weaning ps 12, goal to 8 , cpap 5 if able Neg balance prefer  CARDIOVASCULAR A:   Possible Acute Systolic CHF Exacerbation - EF 30-35% 10/2015. Hypertensive Emergency  P:  We are weeks out from ICH, normalize BP tele  RENAL A:   Acute on Chronic Renal Failure  hypenatremia  P:   No lasix today Na in am  Free water consider further correction NA given weeks from event  GASTROINTESTINAL A:   No acute issues.  P: Protonix IV daily. Continue TFs after peg cleared by IR use PEg today  HEMATOLOGIC A:   Leukocytosis - Sepsis vs Stress Induced.  P:  Trending cell counts daily w/ CBC. SCDs.  INFECTIOUS A:   Possible CAP Possible UTI Treated abx course  P:   Follow temp trend  ENDOCRINE A:   Hyperglycemia - No h/o DM.  P:   Accu-Checks q4hr. SSI per algorithm.  Ccm time 30 min   Mcarthur Rossetti. Tyson Alias, MD, FACP Pgr: [phone] [facility]

## 2015-12-23 NOTE — Progress Notes (Signed)
STROKE TEAM PROGRESS NOTE   SUBJECTIVE (INTERVAL HISTORY) His family is  not at the bedside this morning.  . Remains unresponsive and comatose. has been off sedation for 7 days now.  not following commands or respond to pain stimulation.He intermittently opens his eyes but does not blink to threat.  OBJECTIVE Temp:  [97.1 F (36.2 C)-98.5 F (36.9 C)] 97.6 F (36.4 C) (04/06 1200) Pulse Rate:  [63-92] 92 (04/06 1300) Cardiac Rhythm:  [-] Normal sinus rhythm (04/06 0800) Resp:  [20-26] 20 (04/06 1300) BP: (129-210)/(69-98) 193/87 mmHg (04/06 1300) SpO2:  [94 %-100 %] 97 % (04/06 1322) Arterial Line BP: (116-155)/(57-67) 155/67 mmHg (04/05 1500) FiO2 (%):  [30 %] 30 % (04/06 1322) Weight:  [241 lb 10 oz (109.6 kg)] 241 lb 10 oz (109.6 kg) (04/06 0500)  CBC:   Recent Labs Lab 12/22/15 0438 12/22/2015 0848  WBC 7.4 8.1  HGB 9.8* 12.5*  HCT 30.9* 39.9  MCV 72.5* 72.7*  PLT 173 225    Basic Metabolic Panel:   Recent Labs Lab 12/22/15 0438 01/07/2016 0848  NA 155* 153*  K 3.7 3.9  CL 114* 111  CO2 27 28  GLUCOSE 108* 131*  BUN 44* 33*  CREATININE 1.58* 1.11  CALCIUM 8.6* 9.0  MG 2.3 2.4  PHOS 4.1 4.4    Lipid Panel:     Component Value Date/Time   CHOL 109 10/29/2015 0420   TRIG 101 12/16/2015 0415   HDL 34* 10/29/2015 0420   CHOLHDL 3.2 10/29/2015 0420   VLDL 12 10/29/2015 0420   LDLCALC 63 10/29/2015 0420   HgbA1c:  Lab Results  Component Value Date   HGBA1C 5.9* 12/11/2015   Urine Drug Screen:     Component Value Date/Time   LABOPIA NONE DETECTED 12/13/2015 0959   COCAINSCRNUR NONE DETECTED 11/23/2015 0959   LABBENZ NONE DETECTED 12/11/2015 0959   AMPHETMU NONE DETECTED 11/17/2015 0959   THCU NONE DETECTED 11/17/2015 0959   LABBARB NONE DETECTED 12/04/2015 0959      IMAGING I have personally reviewed the radiological images below and agree with the radiology interpretations.  Ct Head Wo Contrast 12/14/2015   Right thalamic hemorrhage as well as  intraventricular hemorrhages with mild mass effect and a 2 mm right-to-left midline shift. Diffuse effacement of the sulci with possible mild diffuse cerebral edema.    12/13/2015  1. Stable size of right basal ganglia hemorrhage.  2. Slight increase in midline shift, now measuring 7 mm.  3. Increased prominence of the temporal horns of the lateral ventricles compatible with developing hydrocephalus.    12/15/2015   1. Unchanged size of right thalamic hemorrhage. Decreased size of the ventricles following interval ventriculostomy catheter placement. Slightly decreased volume of intraventricular hemorrhage.  2. Decreased gray-white differentiation and diffuse sulcal effacement concerning for diffuse edema.    Dg Chest Portable 1 View 11/25/2015   Appliances appear to be in satisfactory location. Diffuse airspace disease throughout both lungs.   12/15/2015   1. Left IJ line tip noted in unchanged position to the left of midline. Reference is made to prior chest x-ray report of 12/13/2015. Again intra-arterial location of the left IJ line cannot be excluded. This finding was reported verbally to the patient's caregiver on 12/13/2015. Endotracheal tube and NG tube in stable position.  2. Cardiomegaly with persistent bilateral pulmonary infiltrates/pulmonary edema. Similar findings noted on prior exam.   12/19/2015 Little significant change with bibasilar atelectasis/ airspace disease and cardiomegaly again noted. Left-sided vascular catheter as  described-correlate clinically.   EEG 01/03/16 Abnormal EEG in the comatose state demonstrating: 1. Burst suppression pattern (1-10 seconds of burst activity with up to 10-30 seconds of suppression). 2. During the burst activity, there are frequent right frontal-temporal poly-spike and spike-and-wave discharges. These are associated with myoclonic jerks on video recording.   LTM EEG  12/13/15 This EEG is consistent with diffuse non-specific cerebral  dysfunction, with epileptiform discharges in the right hemisphere that improve though the duration of the recording. No electrographic seizures were seen. 12/14/15 This EEG is consistent with diffuse non-specific cerebral dysfunction, with suggestion of cortical irritability in the right hemisphere. No electrographic seizures were seen.   TTE - - Left ventricle: The cavity size was moderately dilated. Wall  thickness was normal. The estimated ejection fraction was 40%.  Diffuse hypokinesis.   Renal artery ultrasound - - Bilateral abnormal intrarenal resistive indices. - No evidence of right renal artery stenosis. - No evidence of left renal artery stenosis.   Mri and Mra Head Wo Contrast 12/17/2015   1. Bilateral cortical edema/infarction and small caudate infarcts compatible with global anoxic injury.  2. Size stable right thalamic and intraventricular hemorrhage. No MRA finding to explain the hematoma.  3. Right frontal shunt with stable ventricular volume and preferential right lateral ventricle drainage.  4. Progressive sinusitis and mastoiditis.     PHYSICAL EXAMINATION  Temp:  [97.1 F (36.2 C)-98.5 F (36.9 C)] 97.6 F (36.4 C) (04/06 1200) Pulse Rate:  [63-92] 92 (04/06 1300) Resp:  [20-26] 20 (04/06 1300) BP: (129-210)/(69-98) 193/87 mmHg (04/06 1300) SpO2:  [94 %-100 %] 97 % (04/06 1322) Arterial Line BP: (116-155)/(57-67) 155/67 mmHg (04/05 1500) FiO2 (%):  [30 %] 30 % (04/06 1322) Weight:  [241 lb 10 oz (109.6 kg)] 241 lb 10 oz (109.6 kg) (04/06 0500)  General - Well nourished, well developed, intubated and off sedation.  Ophthalmologic - Fundi not visualized due to ET positioning.  Cardiovascular - Regular rhythm and rate.  Neuro - intubated and off sedation, does not respond to voice or sternal rub. No pupillary reflexes, no dolls eyes, weak corneal bilaterally, with very weak gag. Not withdraw to pain in all extremities. DTR diminished and no babinski.  Sensation, coordination and gait not tested. Not breathing over the vent.   ASSESSMENT/PLAN Mr. Keijuan Schellhase is a 50 y.o. male with history of chronic kidney disease, community-acquired pneumonia, and hypertension, congestive heart failure, presenting with confusion, dysarthria, and subsequent loss of consciousness. Patient required intubation and went into PEA arrest for 15 min with resuscitation. Found to have a right BG ICH with IVH.  He did not receive IV t-PA due to hemorrhage.  Comatose state due to Global anoxic brain injury due to prolonged cardiac arrest  Cardiac arrest for  MRI consistent with global anoxic brain injury  EEG myoclonic status -> flat lines  Neuro exam showed weak corneal and gag, no papillary or dolls eyes, not breathing over the vent  Poor prognosis  Patient had a ethics consult and clearly family in [place] unwilling  for cultural reasons  to withdraw ventilatory support and patient likely to survive in the persistent vegetative state   ICH:  right BG ICH with ventricular extension.  CT showed right BG ICH with IVH  Repeat CT head showed stable hematoma but obstructive hydrocephalus  Repeat CT again showed stable hematoma and decreased hydrocephalus after EVD  2D Echo EF 40%, improved from prior.  LDL 60 on 10/29/2015  HgbA1c 5.9  VTE prophylaxis - SCDs and heparin subq Diet NPO time specified  No antithrombotic prior to admission, now on No antithrombotic secondary to hemorrhage.  Ongoing aggressive stroke risk factor management  Disposition - pending  Obstructive hydrocephalus  CT repeat showed above findings  EVD placed by NSG  Repeat CT showed decreased hydrocephalus  MRI showed stable ventricle size  Seizure  Initial stat EEG burst suppression with myoclonic status  Put on versed drip high dose - weaning off  Put on keppra and depakote  LTM EEG so far showed no seizure but slowing -> flat lines  Maximize keppra  to 1500mg  bid  depakote load again and increase to 1250mg  Q8  depakote level 33->48->69->61->51  PRN Ativan  Continue LTM EEG flat lines with versed weaning off - currently off  Cerebral edema  CT repeat showed increase midline shift  D/c mannitol  3% saline on hold  Na Q6h 149->154->157->160->157->159->155 (Sunday)  Na goal 150-155  On free water 200 ml Q4 hrs  Cardiac arrest  Likely related to IVH down to IV ventricle  Resuscitated but with global hypoxic injury  Burst suppression on EEG, resolved on versed -> slowing -> flat lines  NSR now  respiratory failure and aspiration pneumonia  Intubated   CCM on board  On vanco and zosyn   Leukocytosis 17.0->21.2->14.1->11.9->9.1->7.8-> 8.4 (Sunday)  Hypertension  Managed with Cardene PRN  Developed with hypotension and on Neo, now off  BP goal < 160  Resumed home meds coreg, clonidine, hydralazine, and cozaar  Increased hydralazine to 100mg  tid  Increase clonidine to 0.3 mg tid  PRN Labetalol  Fever, resolved  UA negative  BCx NGTD  Urine Cx - no growth  Discuss with NSG about CSF sampling, but unlikely ventriculitis  CXR stable 12/19/2015  On Zosyn (Vanco D/C'd)  Central fever possible  Other Stroke Risk Factors  Cigarette smoker, quit smoking 8 years ago.  Obesity, Body mass index is 31.89 kg/(m^2).   Other Active Problems  Cardiomyopathy EF 40%   Chronic kidney disease, Cre 2.41->2.26->1.78 ->1.36-> 1.59 (Sunday)  Comatose state s/p hypoxic injury from PEA cardiac arrest  Anemia    (Hb - 10.4)    (Hct - 35) (Sunday)  Calcium - 8.8      Mg - 2.5  Hospital day # 11  This patient is critically ill due to right ICH and IVH, hydrocephalus, seizure, cardiac arrest, cerebral edema and at significant risk of neurological worsening, death form recurrent bleeding, brain herniation, heart failure, cardiac arrest, hydrocephalus. This patient's care requires constant monitoring of vital  signs, hemodynamics, respiratory and cardiac monitoring, review of multiple databases, neurological assessment, discussion with family, other specialists and medical decision making of high complexity. I spent 30 minutes of neurocritical care time in the care of this patient discussed with Dr. Molli KnockYacoub. Patient's family insist on prolonged ventilatory support, tracheostomy and PEG tube even though the patient is likely going to be in persistent vegetative state..  Ventriculostomy catheter is a catheter is clamped and follow-up CT scan is pending later today Personally examined patient and images, and have participated in and made any corrections needed to history, physical, neuro exam,assessment and plan as stated above.  I have personally obtained the history, evaluated lab date, reviewed imaging studies and agree with radiology interpretations.     Delia HeadyPramod Sethi MD Stroke Neurology     To contact Stroke Continuity provider, please refer to [email or web address]. After hours, contact General Neurology

## 2015-12-23 NOTE — Progress Notes (Signed)
2118 called Elink to notify SBP 170's-190's after PRN labetalol given. Orders given for 10-40 mg hydralazine  q 4.  2338 called Elink again due to BP remaining elevated after hydralazine and fentanyl. Orders given to administer another 20 mg hydralazine now. Will continue to monitor.

## 2015-12-23 NOTE — Progress Notes (Signed)
Patient placed on CPAP 5/ PS 5 with only 150 volume return. PS increased to 12 to maintain adequate volumes. RN aware.

## 2015-12-23 NOTE — Progress Notes (Signed)
Patient seen for trach team follow up.  No education given at this time.  All equipment needed at bedside.  Will continue to follow.

## 2015-12-24 DIAGNOSIS — G911 Obstructive hydrocephalus: Secondary | ICD-10-CM

## 2015-12-24 DIAGNOSIS — I619 Nontraumatic intracerebral hemorrhage, unspecified: Secondary | ICD-10-CM | POA: Insufficient documentation

## 2015-12-24 LAB — GLUCOSE, CAPILLARY
GLUCOSE-CAPILLARY: 105 mg/dL — AB (ref 65–99)
GLUCOSE-CAPILLARY: 145 mg/dL — AB (ref 65–99)
Glucose-Capillary: 113 mg/dL — ABNORMAL HIGH (ref 65–99)
Glucose-Capillary: 118 mg/dL — ABNORMAL HIGH (ref 65–99)

## 2015-12-24 LAB — BASIC METABOLIC PANEL
Anion gap: 11 (ref 5–15)
BUN: 40 mg/dL — AB (ref 6–20)
CHLORIDE: 115 mmol/L — AB (ref 101–111)
CO2: 27 mmol/L (ref 22–32)
Calcium: 8.8 mg/dL — ABNORMAL LOW (ref 8.9–10.3)
Creatinine, Ser: 1.27 mg/dL — ABNORMAL HIGH (ref 0.61–1.24)
GFR calc Af Amer: >60 mL/min (ref >60)
GFR calc non Af Amer: >60 mL/min (ref >60)
GLUCOSE: 138 mg/dL — AB (ref 65–99)
POTASSIUM: 3.7 mmol/L (ref 3.5–5.1)
Sodium: 153 mmol/L — ABNORMAL HIGH (ref 135–145)

## 2015-12-24 NOTE — Progress Notes (Signed)
Patient ID: Austin Mccarty, male   DOB: 12/02/1965, 50 y.o.   MRN: [id number] BP 123/70 mmHg  Pulse 73  Temp(Src) 100.9 F (38.3 C) (Axillary)  Resp 20  Ht 6\' 1"  (1.854 m)  Wt 108.5 kg (239 lb 3.2 oz)  BMI 31.57 kg/m2  SpO2 98% Comatose.  I removed the ventricular catheter today. Most of the intraventricular blood now gone.  No change in exam since drain clamped on Wednesday. Will sign off.

## 2015-12-24 NOTE — Progress Notes (Signed)
PULMONARY / CRITICAL CARE MEDICINE   Name: Austin Mccarty MRN: [id number] DOB: 01/21/1966    ADMISSION DATE:  Apr 26, 2016  CHIEF COMPLAINT:  PEA arrest  HISTORY OF PRESENT ILLNESS:   50 yo male with h/o HTN, HFrEF, CKD, DCM 2/2 HTN who presented 3/26 from home with loss of consciousness.  Marland Kitchen.  He arrived in the ED with sats in the 60's, emergently intubated then PEA arrest x7315min then ROSC.  He was found to have a large right sided ICH with intraventricular hemorrhage likely 2/2 HTN.  SUBJECTIVE: hydro with clamp drain  VITAL SIGNS: Temp:  [97.4 F (36.3 C)-98.5 F (36.9 C)] 98.5 F (36.9 C) (04/07 0800) Pulse Rate:  [62-94] 90 (04/07 0951) Resp:  [20-33] 33 (04/07 0951) BP: (120-193)/(55-118) 138/69 mmHg (04/07 0951) SpO2:  [94 %-100 %] 94 % (04/07 0951) FiO2 (%):  [30 %] 30 % (04/07 0800) Weight:  [108.5 kg (239 lb 3.2 oz)] 108.5 kg (239 lb 3.2 oz) (04/07 0112) HEMODYNAMICS:   VENTILATOR SETTINGS: Vent Mode:  [-] PSV FiO2 (%):  [30 %] 30 % Set Rate:  [20 bmp] 20 bmp Vt Set:  [550 mL] 550 mL PEEP:  [5 cmH20] 5 cmH20 Pressure Support:  [12 cmH20] 12 cmH20 Plateau Pressure:  [20 cmH20-22 cmH20] 21 cmH20 INTAKE / OUTPUT:  Intake/Output Summary (Last 24 hours) at 12/24/15 0953 Last data filed at 12/24/15 0400  Gross per 24 hour  Intake    380 ml  Output   1740 ml  Net  -1360 ml   PHYSICAL EXAMINATION: General:  Unresponsive, coughs blinks, opene eyes Integument:  Warm & dry. No rash on exposed skin.  HEENT:  Trach clean Cardiovascular:  s1 s2 RRR Pulmonary:  CTA Abdomen: Soft. Normal bowel sounds. Nondistended.  Neurological:  Unresponsive. Cough , opens eyes, not following commands of course  LABS:  CBC  Recent Labs Lab 12/21/15 0344 12/22/15 0438 01/07/2016 0848  WBC 8.6 7.4 8.1  HGB 10.5* 9.8* 12.5*  HCT 33.4* 30.9* 39.9  PLT 175 173 225   Coag's No results for input(s): APTT, INR in the last 168 hours. BMET  Recent Labs Lab 12/22/15 0438  01/08/2016 0848 12/24/15 0400  NA 155* 153* 153*  K 3.7 3.9 3.7  CL 114* 111 115*  CO2 27 28 27   BUN 44* 33* 40*  CREATININE 1.58* 1.11 1.27*  GLUCOSE 108* 131* 138*   Electrolytes  Recent Labs Lab 12/21/15 0344 12/22/15 0438 12/26/2015 0848 12/24/15 0400  CALCIUM 8.8* 8.6* 9.0 8.8*  MG 2.3 2.3 2.4  --   PHOS 3.6 4.1 4.4  --    Sepsis Markers No results for input(s): LATICACIDVEN, PROCALCITON, O2SATVEN in the last 168 hours. ABG  Recent Labs Lab 12/18/15 0340 12/19/15 0425 12/21/15 0420  PHART 7.448 7.462* 7.446  PCO2ART 43.3 41.8 42.1  PO2ART 78.5* 84.5 92.9   Liver Enzymes No results for input(s): AST, ALT, ALKPHOS, BILITOT, ALBUMIN in the last 168 hours. Cardiac Enzymes No results for input(s): TROPONINI, PROBNP in the last 168 hours. Glucose  Recent Labs Lab 12/22/15 2310 01/14/2016 0508 01/01/2016 0740 12/29/2015 1136 12/21/2015 1552 12/24/15 0832  GLUCAP 151* 110* 122* 103* 102* 113*   Imaging Ct Head Wo Contrast  12/22/2015  ADDENDUM REPORT: 12/18/2015 16:15 ADDENDUM: Critical Value/emergent results were called by telephone at the time of interpretation on 01/01/2016 at 1553 hours to Dr. Coletta MemosKYLE CABBELL , who verbally acknowledged these results. Electronically Signed   By: Odessa FlemingH  Hall M.D.   On:  12/29/2015 16:15  01/08/2016  CLINICAL DATA:  50 year old male status post external ventricular drain following right thalamic hemorrhage. Initial encounter. EXAM: CT HEAD WITHOUT CONTRAST TECHNIQUE: Contiguous axial images were obtained from the base of the skull through the vertex without intravenous contrast. COMPARISON:  12/22/2015 head CT and earlier. FINDINGS: Stable visualized osseous structures. Subtotal paranasal sinus and nasopharynx opacification with fluid levels. Mastoid effusions. Dysconjugate gaze.  Stable orbit and scalp soft tissues. Stable right vertex approach external ventricular drain, tracking through the anterior right lateral ventricle terminating pattern near  the anterior third ventricle. Interval increased ventricular size throughout. Regressed volume of intraventricular hemorrhage, now small. Diffuse cerebral edema with progressive bilateral loss of gray-white matter differentiation since January 08, 2016. Stable basilar cisterns. No midline shift. Hyperdense intra-axial hemorrhage centered at the right thalamus is evolving as expected. Surrounding edema and mild regional mass effect have not significantly changed. No new areas of intracranial hemorrhage. IMPRESSION: 1. Diffuse cerebral edema with progression since the end of March. No herniation, basilar cisterns remain patent. 2. New ventriculomegaly since yesterday. EVD configuration appears stable. 3. Regressed intraventricular hemorrhage. Expected evolution of the right thalamic hemorrhage. Electronically Signed: By: Odessa Fleming M.D. On: 01/07/2016 15:42   EVENTS: 3/26 - cardiac arrest 3/27 - high dose versed for seizures 3/27 - ventriculostomy 4/6- trach placed, drain clamped 4/6- hydro with clamp  STUDIES: TTE (10/29/15):  EF 30-35% w/ diffuse hypokinesis. No AS or AR. Mild MR but no MS. RV moderately dilated w/ normal systolic function. LA severely dilated & RA mildly dilated. CT HEAD 2:19AM 3/26:  R Thalamic hemorrhage as well as IVH w/ mild mass effect & 2mm right to left midline shift. Possible mild diffuse cerebral edema. Echo 3/27 EF 40% MRI 3/31>>>Diffuse brain damage. eeg 4/5>>>Abnormal routine inpatient EEG suggestive of severe anoxic ischemic encephalopathy based on severe background suppression as described CT head 4/6>>>new enlarged ventricles, diffuse edema  MICROBIOLOGY: Blood Ctx x2 3/26>>> Urine Ctx 3/26>>> Tracheal Asp Ctx 3/26>>> Influenza PCR 3/26>>> Respiratory Viral Panel PCR 3/26>>> Urine Strep Ag 3/26>>> Urine Legionella Ag 3/26>>> MRSA PCR 3/26:  Negative   ANTIBIOTICS: Vancomycin 3/26>>>3/31 Zosyn 3/26>>>4/4  LINES/TUBES: OETT 7.5 3/26>>>4/6 Trach (jy) 4/6>>> OGT  3/26>>>4/6 FOLEY 3/26>>> 3/27 vstomy >>4/7 planned  ASSESSMENT / PLAN:  NEUROLOGIC A:   Horrible prognosis Right sided ICH - Sub-falcine herniation. Intraventricular Hemorrhage Status epilepticus UDS neg IVD in place clamp results in hydro P:   NS to remove drain as NOT a candidate for shunt, will not change outcome Keppra & valproate IV. If declines to brain death exam will apnea him  PULMONARY A: Acute Hypoxic/ hypercarbic  Respiratory Failure - Secondary to pulmonary edema. Pulmonary Edema - Cardiogenic vs Neurogenic  P:   Cautious weaning, PS 12 With new hydro, have TV , apnea alarms No pcxr needed  CARDIOVASCULAR A:  Possible Acute Systolic CHF Exacerbation - EF 30-35% 10/2015. Hypertensive Emergency - resolved  P:  sys goals should normalize tele sys 130 acceptable  RENAL A:   Acute on Chronic Renal Failure  hypenatremia  P:   No lasix planned  GASTROINTESTINAL A:   S/p peg  P: Protonix IV daily. PEg fed when okay by surgery  HEMATOLOGIC A:   Leukocytosis - resolved P:  Limit phlebotomy SCDs.  INFECTIOUS A:   Possible CAP Possible UTI Treated abx course  P:   Follow temp trend Drain to dc by NS  ENDOCRINE A:   Hyperglycemia - No h/o DM.  P:   SSI  per algorithm.  Ccm time 30 min   Prognosis is horrible for any meaningfully recovery Heroics, ACLS, any escalation would be futile and medically ineffective  Mcarthur Rossetti. Tyson Alias, MD, FACP Pgr: [phone] [facility]

## 2015-12-24 NOTE — Progress Notes (Signed)
STROKE TEAM PROGRESS NOTE   SUBJECTIVE (INTERVAL HISTORY) His brother is  not at the bedside this morning.  . Remains unresponsive and comatose. has been off sedation for 8 days now.  not following commands or respond to pain stimulation.He intermittently opens his eyes but does not blink to threat. He had repeat CT scan of the head done yesterday which shows increase in ventricular size after the ventriculostomy catheter was clamped OBJECTIVE Temp:  [97.4 F (36.3 C)-100.4 F (38 C)] 100.4 F (38 C) (04/07 1200) Pulse Rate:  [62-94] 80 (04/07 1200) Cardiac Rhythm:  [-] Normal sinus rhythm (04/07 0500) Resp:  [20-41] 21 (04/07 1200) BP: (120-187)/(55-118) 152/79 mmHg (04/07 1200) SpO2:  [94 %-100 %] 99 % (04/07 1200) FiO2 (%):  [30 %] 30 % (04/07 1149) Weight:  [239 lb 3.2 oz (108.5 kg)] 239 lb 3.2 oz (108.5 kg) (04/07 0112)  CBC:   Recent Labs Lab 12/22/15 0438 January 12, 2016 0848  WBC 7.4 8.1  HGB 9.8* 12.5*  HCT 30.9* 39.9  MCV 72.5* 72.7*  PLT 173 225    Basic Metabolic Panel:   Recent Labs Lab 12/22/15 0438 2016-01-12 0848 12/24/15 0400  NA 155* 153* 153*  K 3.7 3.9 3.7  CL 114* 111 115*  CO2 27 28 27   GLUCOSE 108* 131* 138*  BUN 44* 33* 40*  CREATININE 1.58* 1.11 1.27*  CALCIUM 8.6* 9.0 8.8*  MG 2.3 2.4  --   PHOS 4.1 4.4  --     Lipid Panel:     Component Value Date/Time   CHOL 109 10/29/2015 0420   TRIG 101 12/16/2015 0415   HDL 34* 10/29/2015 0420   CHOLHDL 3.2 10/29/2015 0420   VLDL 12 10/29/2015 0420   LDLCALC 63 10/29/2015 0420   HgbA1c:  Lab Results  Component Value Date   HGBA1C 5.9* 11/28/2015   Urine Drug Screen:     Component Value Date/Time   LABOPIA NONE DETECTED 11/26/2015 0959   COCAINSCRNUR NONE DETECTED 11/23/2015 0959   LABBENZ NONE DETECTED 12/07/2015 0959   AMPHETMU NONE DETECTED 11/23/2015 0959   THCU NONE DETECTED 12/16/2015 0959   LABBARB NONE DETECTED 11/28/2015 0959      IMAGING I have personally reviewed the  radiological images below and agree with the radiology interpretations.  Ct Head Wo Contrast 12/14/2015   Right thalamic hemorrhage as well as intraventricular hemorrhages with mild mass effect and a 2 mm right-to-left midline shift. Diffuse effacement of the sulci with possible mild diffuse cerebral edema.    12/13/2015  1. Stable size of right basal ganglia hemorrhage.  2. Slight increase in midline shift, now measuring 7 mm.  3. Increased prominence of the temporal horns of the lateral ventricles compatible with developing hydrocephalus.    12/15/2015   1. Unchanged size of right thalamic hemorrhage. Decreased size of the ventricles following interval ventriculostomy catheter placement. Slightly decreased volume of intraventricular hemorrhage.  2. Decreased gray-white differentiation and diffuse sulcal effacement concerning for diffuse edema.    Dg Chest Portable 1 View 11/20/2015   Appliances appear to be in satisfactory location. Diffuse airspace disease throughout both lungs.   12/15/2015   1. Left IJ line tip noted in unchanged position to the left of midline. Reference is made to prior chest x-ray report of 12/13/2015. Again intra-arterial location of the left IJ line cannot be excluded. This finding was reported verbally to the patient's caregiver on 12/13/2015. Endotracheal tube and NG tube in stable position.  2. Cardiomegaly with persistent  bilateral pulmonary infiltrates/pulmonary edema. Similar findings noted on prior exam.   12/19/2015 Little significant change with bibasilar atelectasis/ airspace disease and cardiomegaly again noted. Left-sided vascular catheter as described-correlate clinically.   EEG 01/01/16 Abnormal EEG in the comatose state demonstrating: 1. Burst suppression pattern (1-10 seconds of burst activity with up to 10-30 seconds of suppression). 2. During the burst activity, there are frequent right frontal-temporal poly-spike and spike-and-wave  discharges. These are associated with myoclonic jerks on video recording.   LTM EEG  12/13/15 This EEG is consistent with diffuse non-specific cerebral dysfunction, with epileptiform discharges in the right hemisphere that improve though the duration of the recording. No electrographic seizures were seen. 12/14/15 This EEG is consistent with diffuse non-specific cerebral dysfunction, with suggestion of cortical irritability in the right hemisphere. No electrographic seizures were seen.   TTE - - Left ventricle: The cavity size was moderately dilated. Wall  thickness was normal. The estimated ejection fraction was 40%.  Diffuse hypokinesis.   Renal artery ultrasound - - Bilateral abnormal intrarenal resistive indices. - No evidence of right renal artery stenosis. - No evidence of left renal artery stenosis.   Mri and Mra Head Wo Contrast 12/17/2015   1. Bilateral cortical edema/infarction and small caudate infarcts compatible with global anoxic injury.  2. Size stable right thalamic and intraventricular hemorrhage. No MRA finding to explain the hematoma.  3. Right frontal shunt with stable ventricular volume and preferential right lateral ventricle drainage.  4. Progressive sinusitis and mastoiditis.     PHYSICAL EXAMINATION  Temp:  [97.4 F (36.3 C)-100.4 F (38 C)] 100.4 F (38 C) (04/07 1200) Pulse Rate:  [62-94] 80 (04/07 1200) Resp:  [20-41] 21 (04/07 1200) BP: (120-187)/(55-118) 152/79 mmHg (04/07 1200) SpO2:  [94 %-100 %] 99 % (04/07 1200) FiO2 (%):  [30 %] 30 % (04/07 1149) Weight:  [239 lb 3.2 oz (108.5 kg)] 239 lb 3.2 oz (108.5 kg) (04/07 0112)  General - Well nourished, well developed, intubated and off sedation.  Ophthalmologic - Fundi not visualized due to ET positioning.  Cardiovascular - Regular rhythm and rate.  Neuro - intubated and off sedation, does not respond to voice or sternal rub. No pupillary reflexes, no dolls eyes, weak corneal bilaterally,  with very weak gag. Not withdraw to pain in all extremities. DTR diminished and no babinski. Sensation, coordination and gait not tested. Not breathing over the vent.   ASSESSMENT/PLAN Mr. Gasper Hopes is a 50 y.o. male with history of chronic kidney disease, community-acquired pneumonia, and hypertension, congestive heart failure, presenting with confusion, dysarthria, and subsequent loss of consciousness. Patient required intubation and went into PEA arrest for 15 min with resuscitation. Found to have a right BG ICH with IVH.  He did not receive IV t-PA due to hemorrhage.  Comatose state due to Global anoxic brain injury due to prolonged cardiac arrest  Cardiac arrest for  MRI consistent with global anoxic brain injury  EEG myoclonic status -> flat lines  Neuro exam showed weak corneal and gag, no papillary or dolls eyes, not breathing over the vent  Poor prognosis  Patient had a ethics consult and clearly family in [place] unwilling  for cultural reasons  to withdraw ventilatory support and patient likely to survive in the persistent vegetative state   ICH:  right BG ICH with ventricular extension.  CT showed right BG ICH with IVH  Repeat CT head showed stable hematoma but obstructive hydrocephalus  Repeat CT again showed stable hematoma and  decreased hydrocephalus after EVD  2D Echo EF 40%, improved from prior.  LDL 60 on 10/29/2015  HgbA1c 5.9  VTE prophylaxis - SCDs and heparin subq Diet NPO time specified  No antithrombotic prior to admission, now on No antithrombotic secondary to hemorrhage.  Ongoing aggressive stroke risk factor management  Disposition - pending  Obstructive hydrocephalus  CT repeat showed above findings  EVD placed by NSG  Repeat CT showed decreased hydrocephalus  MRI showed stable ventricle size  Seizure  Initial stat EEG burst suppression with myoclonic status  Put on versed drip high dose - weaning off  Put on keppra  and depakote  LTM EEG so far showed no seizure but slowing -> flat lines  Maximize keppra to 1500mg  bid  depakote load again and increase to 1250mg  Q8  depakote level 33->48->69->61->51  PRN Ativan  Continue LTM EEG flat lines with versed weaning off - currently off  Cerebral edema  CT repeat showed increase midline shift  D/c mannitol  3% saline on hold  Na Q6h 149->154->157->160->157->159->155 (Sunday)  Na goal 150-155  On free water 200 ml Q4 hrs  Cardiac arrest  Likely related to IVH down to IV ventricle  Resuscitated but with global hypoxic injury  Burst suppression on EEG, resolved on versed -> slowing -> flat lines  NSR now  respiratory failure and aspiration pneumonia  Intubated   CCM on board  On vanco and zosyn   Leukocytosis 17.0->21.2->14.1->11.9->9.1->7.8-> 8.4 (Sunday)  Hypertension  Managed with Cardene PRN  Developed with hypotension and on Neo, now off  BP goal < 160  Resumed home meds coreg, clonidine, hydralazine, and cozaar  Increased hydralazine to 100mg  tid  Increase clonidine to 0.3 mg tid  PRN Labetalol  Fever, resolved  UA negative  BCx NGTD  Urine Cx - no growth  Discuss with NSG about CSF sampling, but unlikely ventriculitis  CXR stable 12/19/2015  On Zosyn (Vanco D/C'd)  Central fever possible  Other Stroke Risk Factors  Cigarette smoker, quit smoking 8 years ago.  Obesity, Body mass index is 31.57 kg/(m^2).   Other Active Problems  Cardiomyopathy EF 40%   Chronic kidney disease, Cre 2.41->2.26->1.78 ->1.36-> 1.59 (Sunday)  Comatose state s/p hypoxic injury from PEA cardiac arrest  Anemia    (Hb - 10.4)    (Hct - 35) (Sunday)  Calcium - 8.8      Mg - 2.5  Hospital day # 12  This patient is critically ill due to right ICH and IVH, hydrocephalus, seizure, cardiac arrest, cerebral edema and at significant risk of neurological worsening, death form recurrent bleeding, brain herniation, heart  failure, cardiac arrest, hydrocephalus. This patient's care requires constant monitoring of vital signs, hemodynamics, respiratory and cardiac monitoring, review of multiple databases, neurological assessment, discussion with family, other specialists and medical decision making of high complexity. I spent 35 minutes of neurocritical care time in the care of this patient discussed with Dr. Molli KnockYacoub. Patient's family insist on prolonged ventilatory support, tracheostomy and PEG tube even though the patient is likely going to be in persistent vegetative state..  Repeat CT scan of the head after ventriculostomy catheter was clamped shows hydrocephalus. Given patient's extremely poor prognosis for meaningful recovery doing the VP shunt would be a futile and medical ineffective step. Family not available at the bedside for discussion. Stroke team will sign off. Kindly call for questions. Discussed with Dr. Tyson AliasFeinstein and Mikal Planeabell  Personally examined patient and images, and have participated in and made any corrections  needed to history, physical, neuro exam,assessment and plan as stated above.  I have personally obtained the history, evaluated lab date, reviewed imaging studies and agree with radiology interpretations.     Delia Heady MD Stroke Neurology     To contact Stroke Continuity provider, please refer to [email or web address]. After hours, contact General Neurology

## 2015-12-24 NOTE — Progress Notes (Signed)
Patient ID: Maudry DiegoMohialdin H. Vernon, male   DOB: 10/24/1965, 50 y.o.   MRN: [id number]   LOS: 12 days   Subjective: Nonverbal   Objective: Vital signs in last 24 hours: Temp:  [97.4 F (36.3 C)-98.4 F (36.9 C)] 97.8 F (36.6 C) (04/07 0400) Pulse Rate:  [62-92] 90 (04/07 0759) Resp:  [20-30] 30 (04/07 0759) BP: (120-197)/(55-118) 152/79 mmHg (04/07 0759) SpO2:  [94 %-100 %] 98 % (04/07 0500) FiO2 (%):  [30 %] 30 % (04/07 0800) Weight:  [108.5 kg (239 lb 3.2 oz)] 108.5 kg (239 lb 3.2 oz) (04/07 0112) Last BM Date: 12/20/15   Laboratory  BMET  Recent Labs  01/12/2016 0848 12/24/15 0400  NA 153* 153*  K 3.9 3.7  CL 111 115*  CO2 28 27  GLUCOSE 131* 138*  BUN 33* 40*  CREATININE 1.11 1.27*  CALCIUM 9.0 8.8*    Physical Exam General appearance: no distress Resp: clear to auscultation bilaterally GI: Soft, PEG site WNL, tolerating TF this am   Assessment/Plan: Dysphagia -- PEG functional, no complications. Surgery will sign off.    Freeman CaldronMichael J. Guynell Kleiber, PA-C Pager: [phone] General Trauma PA Pager: [phone]  12/24/2015

## 2015-12-25 DIAGNOSIS — R58 Hemorrhage, not elsewhere classified: Secondary | ICD-10-CM

## 2015-12-25 LAB — URINALYSIS, ROUTINE W REFLEX MICROSCOPIC
BILIRUBIN URINE: NEGATIVE
Glucose, UA: NEGATIVE mg/dL
KETONES UR: NEGATIVE mg/dL
Nitrite: NEGATIVE
PROTEIN: 30 mg/dL — AB
Specific Gravity, Urine: 1.028 (ref 1.005–1.030)
pH: 5.5 (ref 5.0–8.0)

## 2015-12-25 LAB — GLUCOSE, CAPILLARY
GLUCOSE-CAPILLARY: 135 mg/dL — AB (ref 65–99)
GLUCOSE-CAPILLARY: 97 mg/dL (ref 65–99)
GLUCOSE-CAPILLARY: 120 mg/dL — AB (ref 65–99)
Glucose-Capillary: 123 mg/dL — ABNORMAL HIGH (ref 65–99)
Glucose-Capillary: 98 mg/dL (ref 65–99)

## 2015-12-25 LAB — URINE MICROSCOPIC-ADD ON

## 2015-12-25 MED ORDER — PIPERACILLIN-TAZOBACTAM 3.375 G IVPB
3.3750 g | Freq: Three times a day (TID) | INTRAVENOUS | Status: DC
  Administered 2015-12-25 – 2015-12-27 (×5): 3.375 g via INTRAVENOUS
  Filled 2015-12-25 (×9): qty 50

## 2015-12-25 MED ORDER — VANCOMYCIN HCL 10 G IV SOLR
1250.0000 mg | Freq: Two times a day (BID) | INTRAVENOUS | Status: DC
  Administered 2015-12-26 – 2015-12-27 (×3): 1250 mg via INTRAVENOUS
  Filled 2015-12-25 (×5): qty 1250

## 2015-12-25 MED ORDER — VANCOMYCIN HCL 10 G IV SOLR
1750.0000 mg | Freq: Once | INTRAVENOUS | Status: AC
  Administered 2015-12-25: 1750 mg via INTRAVENOUS
  Filled 2015-12-25: qty 1750

## 2015-12-25 NOTE — Progress Notes (Signed)
eLink Physician-Brief Progress Note Patient Name: Austin Mccarty DOB: 11/14/1965 MRN: [id number]   Date of Service  12/25/2015  HPI/Events of Note  Fever to 103.1 F. Infectious vs central? No antibiotics. Already had Tylenol. Allergic to Ibuprofen.  eICU Interventions  Will order: 1. Blood Cultures X 2.  2. Tracheal Aspirate Culture. 3. UA. 4. Cooling blanket.  5. Vancomycin and Zosyn per pharmacy consultation.       Intervention Category Major Interventions: Other:  Lenell AntuSommer,Leanne Sisler Eugene 12/25/2015, 4:39 PM

## 2015-12-25 NOTE — Progress Notes (Signed)
PULMONARY / CRITICAL CARE MEDICINE   Name: Austin Mccarty MRN: [id number] DOB: 09/11/1966    ADMISSION DATE:  11/21/2015  CHIEF COMPLAINT:  PEA arrest  HISTORY OF PRESENT ILLNESS:   50 yo male with h/o HTN, HFrEF, CKD, DCM 2/2 HTN who presented 3/26 from home with loss of consciousness.   He arrived in the ED with sats in the 60's, emergently intubated then PEA arrest x7015min then ROSC.  He was found to have a large right sided ICH with intraventricular hemorrhage likely 2/2 HTN.  SUBJECTIVE:    ventric drain removed yesterday. Nsgy signed off.  No change.  On ATC trial this am but increasing RR.   VITAL SIGNS: Temp:  [99.1 F (37.3 C)-100.9 F (38.3 C)] 99.1 F (37.3 C) (04/08 0400) Pulse Rate:  [73-98] 98 (04/08 0803) Resp:  [20-41] 39 (04/08 0803) BP: (111-170)/(55-104) 156/76 mmHg (04/08 0803) SpO2:  [94 %-100 %] 99 % (04/08 0800) FiO2 (%):  [30 %-40 %] 40 % (04/08 0803) Weight:  [108 kg (238 lb 1.6 oz)] 108 kg (238 lb 1.6 oz) (04/08 0315) HEMODYNAMICS:   VENTILATOR SETTINGS: Vent Mode:  [-] PRVC FiO2 (%):  [30 %-40 %] 40 % Set Rate:  [20 bmp] 20 bmp Vt Set:  [550 mL] 550 mL PEEP:  [5 cmH20] 5 cmH20 Plateau Pressure:  [17 cmH20-20 cmH20] 20 cmH20 INTAKE / OUTPUT:  Intake/Output Summary (Last 24 hours) at 12/25/15 0858 Last data filed at 12/25/15 0800  Gross per 24 hour  Intake   1808 ml  Output   1500 ml  Net    308 ml   PHYSICAL EXAMINATION: General:  Unresponsive, coughs blinks Integument:  Warm & dry. No rash on exposed skin.  HEENT:  Trach clean Cardiovascular:  s1 s2 RRR Pulmonary:  resps even, mild tachypnea on ATC, scattered rhonchi  Abdomen: Soft. Normal bowel sounds. Nondistended.  Neurological:  Unresponsive. Cough , opens eyes, not following commands  LABS:  CBC  Recent Labs Lab 12/21/15 0344 12/22/15 0438 12/19/2015 0848  WBC 8.6 7.4 8.1  HGB 10.5* 9.8* 12.5*  HCT 33.4* 30.9* 39.9  PLT 175 173 225   Coag's No results for input(s):  APTT, INR in the last 168 hours. BMET  Recent Labs Lab 12/22/15 0438 01/16/2016 0848 12/24/15 0400  NA 155* 153* 153*  K 3.7 3.9 3.7  CL 114* 111 115*  CO2 27 28 27   BUN 44* 33* 40*  CREATININE 1.58* 1.11 1.27*  GLUCOSE 108* 131* 138*   Electrolytes  Recent Labs Lab 12/21/15 0344 12/22/15 0438 12/18/2015 0848 12/24/15 0400  CALCIUM 8.8* 8.6* 9.0 8.8*  MG 2.3 2.3 2.4  --   PHOS 3.6 4.1 4.4  --    Sepsis Markers No results for input(s): LATICACIDVEN, PROCALCITON, O2SATVEN in the last 168 hours. ABG  Recent Labs Lab 12/19/15 0425 12/21/15 0420  PHART 7.462* 7.446  PCO2ART 41.8 42.1  PO2ART 84.5 92.9   Liver Enzymes No results for input(s): AST, ALT, ALKPHOS, BILITOT, ALBUMIN in the last 168 hours. Cardiac Enzymes No results for input(s): TROPONINI, PROBNP in the last 168 hours. Glucose  Recent Labs Lab 12/26/2015 1552 12/24/15 0832 12/24/15 1233 12/24/15 1540 12/24/15 2041 12/24/15 2321  GLUCAP 102* 113* 105* 145* 118* 123*   Imaging No results found. EVENTS: 3/26 - cardiac arrest 3/27 - high dose versed for seizures 3/27 - ventriculostomy 4/6- trach placed, drain clamped 4/6- hydro with clamp  STUDIES: TTE (10/29/15):  EF 30-35% w/ diffuse hypokinesis. No  AS or AR. Mild MR but no MS. RV moderately dilated w/ normal systolic function. LA severely dilated & RA mildly dilated. CT HEAD 2:19AM 3/26:  R Thalamic hemorrhage as well as IVH w/ mild mass effect & 2mm right to left midline shift. Possible mild diffuse cerebral edema. Echo 3/27 EF 40% MRI 3/31>>>Diffuse brain damage. eeg 4/5>>>Abnormal routine inpatient EEG suggestive of severe anoxic ischemic encephalopathy based on severe background suppression as described CT head 4/6>>>new enlarged ventricles, diffuse edema  MICROBIOLOGY: Blood Ctx x2 3/26>>>NEG Urine Ctx 3/26>>>neg Tracheal Asp Ctx 3/26>>>neg Influenza PCR 3/26>>>NEG Respiratory Viral Panel PCR 3/26>>>NOT SENT Urine Strep Ag  3/26>>>NEG Urine Legionella Ag 3/26>>>NEG MRSA PCR 3/26:  Negative   ANTIBIOTICS: Vancomycin 3/26>>>3/31 Zosyn 3/26>>>4/4  LINES/TUBES: OETT 7.5 3/26>>>4/6 Trach (jy) 4/6>>> OGT 3/26>>>4/6 FOLEY 3/26>>> 3/27 vstomy >>4/7 planned  ASSESSMENT / PLAN:  NEUROLOGIC A:   Horrible prognosis Comatose s/p Right sided ICH - Sub-falcine herniation. Intraventricular Hemorrhage Status epilepticus P:   Drain removed 4/7, NS signed off  NOT a candidate for shunt, will not change outcome Keppra & valproate IV Supportive care  If declines to brain death exam would proceed with apnea testing    PULMONARY A: Acute Hypoxic/ hypercarbic  Respiratory Failure - Secondary to pulmonary edema and comatose state. Pulmonary Edema - Cardiogenic vs Neurogenic P:   Wean daily -- goal ATX but highly doubt will get there reliably  Intermittent f/u CXR    CARDIOVASCULAR A:  Possible Acute Systolic CHF Exacerbation - EF 30-35% 10/2015. Hypertensive Emergency - resolved P:  Continue coreg, clonidine patch, hydralazine, losartan  PRN labetalol    RENAL A:   Acute on Chronic Renal Failure  hypenatremia P:   F/u chem  Continue free water    GASTROINTESTINAL A:   S/p peg P: Protonix IV daily. TF    HEMATOLOGIC A:   Leukocytosis - resolved P:  Limit phlebotomy SCDs.  INFECTIOUS A:   Possible CAP Possible UTI P:   S/p abx course  Follow temp trend Drain to dc by NS  ENDOCRINE A:   Hyperglycemia - No h/o DM.  P:   SSI per algorithm.   Ccm time 30 min   Prognosis is horrible for any meaningfully recovery Heroics, ACLS, any escalation would be futile and medically ineffective.  Continue family discussions.      Dirk Dress, NP 12/25/2015  8:58 AM Pager: [phone] or [phone]  ATTENDING NOTE: I have personally reviewed patient's available data, including medical history, events of note, physical examination and test results as part of my evaluation.  I have discussed with resident/NP Dirk Dress and other care team providers such as pharmacist, RN and RRT & co-ordinated with consultants.  50 y.o. year old male, admitted for AMS. Found to have a large R ICH with herniation. S/P shunt > removed. Pt intubated for airway protection.   On exam, sedated. 160/80, 80, 20. 99. Rhonchi mid-bases. Good s1/s2. Gr 2 edema.    Labs reviewed.   Imaging reviewed personally.    Impression/Plan:  Acute Hypoxemic Hypercapneic Respiratory Failure secondary to unable to protect airway, pulm edema SAH, with herniation. Status epilepticus  Plan : cont vent support. Cont PST. Cont TF. Cont bp meds. Cont other meds. Poor prognosis. On going family meetings.   Code status : full.   No family around.    The patient is critically ill with multiple organ systems failure and requires high complexity decision making for assessment and support, frequent evaluation and  titration of therapies, application of advanced monitoring technologies and extensive interpretation of multiple databases.   Critical Care Time devoted to patient care services described in this note independent of APP time is 35  minutes.   Pollie Meyer, MD 12/25/2015,   3:52 PM [facility] Pager [phone] After 3 pm or if no answer, call [phone]

## 2015-12-25 NOTE — Progress Notes (Signed)
Pharmacy Antibiotic Note  Austin Mccarty is a 50 y.o. male admitted on 12/05/2015 after becoming unresponsive and had a PEA arrest.  He was placed on vancomycin and Zosyn for PNA.  Now to resume antibiotics as empiric therapy for fever; all cultures have been negative thus far.  He was previously sub-therapeutic on vancomycin 1250mg  IV Q24H and his renal function has improved since.  Plan: - Vanc 1750mg  IV x 1, then 1250mg  IV Q12H - Zosyn 3.375gm IV Q8H, 4 hr infusion - Monitor renal fxn, fever curve, micro data, vanc trough prior 4th dose  Height: 6\' 1"  (185.4 cm) Weight: 238 lb 1.6 oz (108 kg) IBW/kg (Calculated) : 79.9  Temp (24hrs), Avg:100 F (37.8 C), Min:99.1 F (37.3 C), Max:102.9 F (39.4 C)   Recent Labs Lab 12/19/15 0440 12/21/15 0344 12/22/15 0438 01-17-2016 0848 12/24/15 0400  WBC 8.4 8.6 7.4 8.1  --   CREATININE 1.59* 1.48* 1.58* 1.11 1.27*    Estimated Creatinine Clearance: 92.7 mL/min (by C-G formula based on Cr of 1.27).    Allergies  Allergen Reactions  . Ibuprofen Itching and Swelling    Antimicrobials this admission: Vanc 3/26 >> 3/31, resume 4/8 >> Zosyn 3/26 >> 4/5, resumed 4/8 >>  Dose adjustments this admission: 3/29 VT = 5 mcg/mL on 1250mg  q24 >> 1g q12 (SCr 1.78)  Microbiology results: 3/26 UCx - NGTD 3/26 TA - negative 3/26 BCx x2 - negative 3/28 BCx x2 - negative 4/8 BCx x2 -   Austin Mccarty D. Laney Potashang, PharmD, BCPS Pager:  [phone] 12/25/2015, 4:54 PM

## 2015-12-26 ENCOUNTER — Encounter (HOSPITAL_COMMUNITY): Payer: Self-pay | Admitting: *Deleted

## 2015-12-26 DIAGNOSIS — A419 Sepsis, unspecified organism: Secondary | ICD-10-CM | POA: Insufficient documentation

## 2015-12-26 LAB — GLUCOSE, CAPILLARY
GLUCOSE-CAPILLARY: 187 mg/dL — AB (ref 65–99)
GLUCOSE-CAPILLARY: 207 mg/dL — AB (ref 65–99)
GLUCOSE-CAPILLARY: 171 mg/dL — AB (ref 65–99)
GLUCOSE-CAPILLARY: 151 mg/dL — AB (ref 65–99)
Glucose-Capillary: 163 mg/dL — ABNORMAL HIGH (ref 65–99)
Glucose-Capillary: 172 mg/dL — ABNORMAL HIGH (ref 65–99)

## 2015-12-26 LAB — BASIC METABOLIC PANEL
ANION GAP: 13 (ref 5–15)
BUN: 42 mg/dL — ABNORMAL HIGH (ref 6–20)
CHLORIDE: 113 mmol/L — AB (ref 101–111)
CO2: 27 mmol/L (ref 22–32)
Calcium: 8.7 mg/dL — ABNORMAL LOW (ref 8.9–10.3)
Creatinine, Ser: 1.4 mg/dL — ABNORMAL HIGH (ref 0.61–1.24)
GFR calc Af Amer: >60 mL/min (ref >60)
GFR, EST NON AFRICAN AMERICAN: 58 mL/min — AB (ref >60)
GLUCOSE: 189 mg/dL — AB (ref 65–99)
POTASSIUM: 3.9 mmol/L (ref 3.5–5.1)
Sodium: 153 mmol/L — ABNORMAL HIGH (ref 135–145)

## 2015-12-26 LAB — CBC
HEMATOCRIT: 38.3 % — AB (ref 39.0–52.0)
HEMOGLOBIN: 11.7 g/dL — AB (ref 13.0–17.0)
MCH: 22 pg — ABNORMAL LOW (ref 26.0–34.0)
MCHC: 30.5 g/dL (ref 30.0–36.0)
MCV: 72.1 fL — AB (ref 78.0–100.0)
Platelets: 231 10*3/uL (ref 150–400)
RBC: 5.31 MIL/uL (ref 4.22–5.81)
RDW: 21 % — ABNORMAL HIGH (ref 11.5–15.5)
WBC: 16.3 10*3/uL — AB (ref 4.0–10.5)

## 2015-12-26 MED ORDER — LABETALOL HCL 100 MG PO TABS
100.0000 mg | ORAL_TABLET | Freq: Two times a day (BID) | ORAL | Status: DC
  Administered 2015-12-26: 100 mg via ORAL
  Filled 2015-12-26 (×2): qty 1

## 2015-12-26 MED ORDER — FREE WATER
400.0000 mL | Status: DC
  Administered 2015-12-26 – 2015-12-27 (×5): 400 mL

## 2015-12-26 NOTE — Progress Notes (Addendum)
PULMONARY / CRITICAL CARE MEDICINE   Name: Austin Mccarty MRN: [id number] DOB: 03/04/1966    ADMISSION DATE:  12/07/2015  CHIEF COMPLAINT:  PEA arrest  HISTORY OF PRESENT ILLNESS:   50 yo male with h/o HTN, HFrEF, CKD, DCM 2/2 HTN who presented 3/26 from home with loss of consciousness.   He arrived in the ED with sats in the 60's, emergently intubated then PEA arrest x6715min then ROSC.  He was found to have a large right sided ICH with intraventricular hemorrhage likely 2/2 HTN.  SUBJECTIVE:    Fever overnight to 103.  Abx added.  Remains hypertensive.    VITAL SIGNS: Temp:  [99.4 F (37.4 C)-103.1 F (39.5 C)] 99.4 F (37.4 C) (04/09 0300) Pulse Rate:  [69-107] 74 (04/09 0807) Resp:  [11-37] 22 (04/09 0807) BP: (126-213)/(64-96) 193/81 mmHg (04/09 0807) SpO2:  [91 %-100 %] 98 % (04/09 0807) FiO2 (%):  [30 %] 30 % (04/09 0807) Weight:  [110.7 kg (244 lb 0.8 oz)] 110.7 kg (244 lb 0.8 oz) (04/09 0300) HEMODYNAMICS:   VENTILATOR SETTINGS: Vent Mode:  [-] PSV FiO2 (%):  [30 %] 30 % Set Rate:  [20 bmp] 20 bmp Vt Set:  [550 mL] 550 mL PEEP:  [5 cmH20] 5 cmH20 Pressure Support:  [15 cmH20] 15 cmH20 Plateau Pressure:  [19 cmH20-21 cmH20] 20 cmH20 INTAKE / OUTPUT:  Intake/Output Summary (Last 24 hours) at 12/26/15 0900 Last data filed at 12/26/15 0600  Gross per 24 hour  Intake   2755 ml  Output   1250 ml  Net   1505 ml   PHYSICAL EXAMINATION: General:  unresponsive, coughs, blinks Integument:  Warm & dry. No rash on exposed skin.  HEENT:  Trach clean, pupils asymmetric - R 5mm and oblong, L 4mm, both NR Cardiovascular:  s1 s2 RRR Pulmonary:  resps even, non labored on PS 15/5, coarse  Abdomen: Soft. Normal bowel sounds. Nondistended.  Neurological:  Unresponsive. Cough , opens eyes, not following commands  LABS:  CBC  Recent Labs Lab 12/21/15 0344 12/22/15 0438 2015/12/21 0848  WBC 8.6 7.4 8.1  HGB 10.5* 9.8* 12.5*  HCT 33.4* 30.9* 39.9  PLT 175 173 225    Coag's No results for input(s): APTT, INR in the last 168 hours. BMET  Recent Labs Lab 12/22/15 0438 2015/12/21 0848 12/24/15 0400  NA 155* 153* 153*  K 3.7 3.9 3.7  CL 114* 111 115*  CO2 27 28 27   BUN 44* 33* 40*  CREATININE 1.58* 1.11 1.27*  GLUCOSE 108* 131* 138*   Electrolytes  Recent Labs Lab 12/21/15 0344 12/22/15 0438 2015/12/21 0848 12/24/15 0400  CALCIUM 8.8* 8.6* 9.0 8.8*  MG 2.3 2.3 2.4  --   PHOS 3.6 4.1 4.4  --    Sepsis Markers No results for input(s): LATICACIDVEN, PROCALCITON, O2SATVEN in the last 168 hours. ABG  Recent Labs Lab 12/21/15 0420  PHART 7.446  PCO2ART 42.1  PO2ART 92.9   Liver Enzymes No results for input(s): AST, ALT, ALKPHOS, BILITOT, ALBUMIN in the last 168 hours. Cardiac Enzymes No results for input(s): TROPONINI, PROBNP in the last 168 hours. Glucose  Recent Labs Lab 12/25/15 0759 12/25/15 1300 12/25/15 1613 12/25/15 1952 12/26/15 0325 12/26/15 0745  GLUCAP 135* 97 120* 98 163* 172*   Imaging No results found. EVENTS: 3/26 - cardiac arrest 3/27 - high dose versed for seizures 3/27 - ventriculostomy 4/6- trach placed, drain clamped 4/6- hydro with clamp  STUDIES: TTE (10/29/15):  EF 30-35% w/ diffuse  hypokinesis. No AS or AR. Mild MR but no MS. RV moderately dilated w/ normal systolic function. LA severely dilated & RA mildly dilated. CT HEAD 2:19AM 3/26:  R Thalamic hemorrhage as well as IVH w/ mild mass effect & 2mm right to left midline shift. Possible mild diffuse cerebral edema. Echo 3/27 EF 40% MRI 3/31>>>Diffuse brain damage. eeg 4/5>>>Abnormal routine inpatient EEG suggestive of severe anoxic ischemic encephalopathy based on severe background suppression as described CT head 4/6>>>new enlarged ventricles, diffuse edema  MICROBIOLOGY: Blood Ctx x2 3/26>>>NEG Urine Ctx 3/26>>>neg Tracheal Asp Ctx 3/26>>>neg Influenza PCR 3/26>>>NEG Respiratory Viral Panel PCR 3/26>>>NOT SENT Urine Strep Ag  3/26>>>NEG Urine Legionella Ag 3/26>>>NEG MRSA PCR 3/26:  Negative  Tracheal aspirate 4/8>>>  ANTIBIOTICS: Vancomycin 3/26>>>3/31, 4/8>>> Zosyn 3/26>>>4/4, 4/8>>>  LINES/TUBES: OETT 7.5 3/26>>>4/6 Trach (jy) 4/6>>> OGT 3/26>>>4/6 FOLEY 3/26>>> 3/27 vstomy >>4/7 planned  ASSESSMENT / PLAN:  NEUROLOGIC A:   Horrible prognosis Comatose s/p Right sided ICH - Sub-falcine herniation. Intraventricular Hemorrhage Status epilepticus P:   Drain removed 4/7, NS signed off  NOT a candidate for shunt, will not change outcome Continue Keppra & valproate IV Supportive care  If declines to brain death exam would proceed with apnea testing    PULMONARY A: Acute Hypoxic/ hypercarbic  Respiratory Failure - Secondary to pulmonary edema and comatose state. Pulmonary Edema - Cardiogenic vs Neurogenic P:   Daily attempts at wean  Intermittent f/u CXR    CARDIOVASCULAR A:  Possible Acute Systolic CHF Exacerbation - EF 30-35% 10/2015. Hypertensive Emergency - resolved P:  Continue coreg, clonidine patch, hydralazine, losartan  PRN labetalol  Add po labetalol  per tube BID   RENAL A:   Acute on Chronic Renal Failure  hypenatremia P:   F/u chem  Increase free water 4/9   GASTROINTESTINAL A:   S/p peg P: Protonix IV daily. TF    HEMATOLOGIC A:   Leukocytosis - resolved P:  Limit phlebotomy SCDs.  INFECTIOUS A:   Possible CAP Possible UTI Fever -- ?central  P:   abx restarted 4/9 for fever 103, suspect central  Follow cultures   ENDOCRINE A:   Hyperglycemia - No h/o DM. P:   SSI per algorithm.   Ccm time 30 min   Prognosis is horrible for any meaningfully recovery Heroics, ACLS, any escalation would be futile and medically ineffective.  Continue family discussions.      Dirk Dress, NP 12/26/2015  9:00 AM Pager: [phone] or [phone]   We formulated plan together. A and P as above. Recent fever with G(+) cic. Started vanc and  zosyn. Failed TC trial. Doing fair on PST. LTAC candidate. To SDU when available bed.   Critical care time with thi pt today : 31 minutes.   Pollie Meyer, MD 12/26/2015, 4:59 PM [place] [facility] Pager [phone] After 3 pm or if no answer, call [phone]

## 2015-12-26 NOTE — Progress Notes (Signed)
CRITICAL VALUE ALERT  Critical value received:  Aerobic gram + cocci in both blood culture bottles  Date of notification:  12/26/2015  Time of notification:  1444  Critical value read back:Yes.    Nurse who received alert:  Emelia Salisburyarrie Maxi Rodas, RN  MD notified (1st page):  Danford BadKathryn Whiteheart, NP  Time of first page:  1447  MD notified (2nd page): Danford BadKathryn Whiteheart, NP  Time of second page: 1453  Responding MD:  Pricilla HolmKathyrn Whiteheart, NP  Time MD responded:  [phone]

## 2015-12-27 ENCOUNTER — Encounter (HOSPITAL_COMMUNITY): Payer: Self-pay | Admitting: General Surgery

## 2015-12-27 DIAGNOSIS — Z515 Encounter for palliative care: Secondary | ICD-10-CM | POA: Insufficient documentation

## 2015-12-27 DIAGNOSIS — I469 Cardiac arrest, cause unspecified: Secondary | ICD-10-CM

## 2015-12-27 DIAGNOSIS — G931 Anoxic brain damage, not elsewhere classified: Secondary | ICD-10-CM

## 2015-12-27 DIAGNOSIS — G9382 Brain death: Secondary | ICD-10-CM | POA: Insufficient documentation

## 2015-12-27 LAB — CBC
HEMATOCRIT: 35.3 % — AB (ref 39.0–52.0)
HEMOGLOBIN: 10.8 g/dL — AB (ref 13.0–17.0)
MCH: 22.4 pg — AB (ref 26.0–34.0)
MCHC: 30.6 g/dL (ref 30.0–36.0)
MCV: 73.1 fL — AB (ref 78.0–100.0)
Platelets: 231 10*3/uL (ref 150–400)
RBC: 4.83 MIL/uL (ref 4.22–5.81)
RDW: 20.6 % — AB (ref 11.5–15.5)
WBC: 13.8 10*3/uL — ABNORMAL HIGH (ref 4.0–10.5)

## 2015-12-27 LAB — POCT I-STAT 3, ART BLOOD GAS (G3+)
ACID-BASE EXCESS: 8 mmol/L — AB (ref 0.0–2.0)
Acid-Base Excess: 6 mmol/L — ABNORMAL HIGH (ref 0.0–2.0)
Bicarbonate: 34 mEq/L — ABNORMAL HIGH (ref 20.0–24.0)
Bicarbonate: 35.4 mEq/L — ABNORMAL HIGH (ref 20.0–24.0)
O2 SAT: 100 %
O2 Saturation: 100 %
PH ART: 7.313 — AB (ref 7.350–7.450)
PO2 ART: 332 mmHg — AB (ref 80.0–100.0)
Patient temperature: 96
TCO2: 36 mmol/L (ref 0–100)
TCO2: 37 mmol/L (ref 0–100)
pCO2 arterial: 64.7 mmHg (ref 35.0–45.0)
pCO2 arterial: 66 mmHg (ref 35.0–45.0)
pH, Arterial: 7.339 — ABNORMAL LOW (ref 7.350–7.450)
pO2, Arterial: 297 mmHg — ABNORMAL HIGH (ref 80.0–100.0)

## 2015-12-27 LAB — BLOOD GAS, ARTERIAL
Acid-Base Excess: 1.6 mmol/L (ref 0.0–2.0)
Acid-Base Excess: 4.7 mmol/L — ABNORMAL HIGH (ref 0.0–2.0)
BICARBONATE: 25.4 meq/L — AB (ref 20.0–24.0)
Bicarbonate: 28.9 mEq/L — ABNORMAL HIGH (ref 20.0–24.0)
Drawn by: 398661
Drawn by: 313941
FIO2: 0.5
FIO2: 1
LHR: 20 {breaths}/min
MECHVT: 550 mL
MECHVT: 550 mL
O2 SAT: 99.4 %
O2 Saturation: 99.8 %
PATIENT TEMPERATURE: 98.6
PATIENT TEMPERATURE: 96.5
PCO2 ART: 38 mmHg (ref 35.0–45.0)
PCO2 ART: 42.6 mmHg (ref 35.0–45.0)
PEEP/CPAP: 5 cmH2O
PEEP: 5 cmH2O
PO2 ART: 192 mmHg — AB (ref 80.0–100.0)
PO2 ART: 370 mmHg — AB (ref 80.0–100.0)
RATE: 18 resp/min
TCO2: 26.6 mmol/L (ref 0–100)
TCO2: 30.3 mmol/L (ref 0–100)
pH, Arterial: 7.441 (ref 7.350–7.450)
pH, Arterial: 7.44 (ref 7.350–7.450)

## 2015-12-27 LAB — BASIC METABOLIC PANEL
Anion gap: 10 (ref 5–15)
BUN: 54 mg/dL — ABNORMAL HIGH (ref 6–20)
CALCIUM: 8.5 mg/dL — AB (ref 8.9–10.3)
CHLORIDE: 121 mmol/L — AB (ref 101–111)
CO2: 29 mmol/L (ref 22–32)
CREATININE: 1.96 mg/dL — AB (ref 0.61–1.24)
GFR calc non Af Amer: 39 mL/min — ABNORMAL LOW (ref >60)
GFR, EST AFRICAN AMERICAN: 45 mL/min — AB (ref >60)
GLUCOSE: 176 mg/dL — AB (ref 65–99)
Potassium: 2.9 mmol/L — ABNORMAL LOW (ref 3.5–5.1)
Sodium: 160 mmol/L — ABNORMAL HIGH (ref 135–145)

## 2015-12-27 LAB — GLUCOSE, CAPILLARY
GLUCOSE-CAPILLARY: 152 mg/dL — AB (ref 65–99)
GLUCOSE-CAPILLARY: 159 mg/dL — AB (ref 65–99)
Glucose-Capillary: 116 mg/dL — ABNORMAL HIGH (ref 65–99)
Glucose-Capillary: 153 mg/dL — ABNORMAL HIGH (ref 65–99)

## 2015-12-27 MED ORDER — NOREPINEPHRINE BITARTRATE 1 MG/ML IV SOLN
2.0000 ug/min | INTRAVENOUS | Status: DC
  Administered 2015-12-27: 10 ug/min via INTRAVENOUS
  Filled 2015-12-27 (×3): qty 4

## 2015-12-28 LAB — POCT I-STAT 3, ART BLOOD GAS (G3+)
ACID-BASE EXCESS: 6 mmol/L — AB (ref 0.0–2.0)
Bicarbonate: 33.6 mEq/L — ABNORMAL HIGH (ref 20.0–24.0)
O2 SAT: 100 %
PCO2 ART: 67.5 mmHg — AB (ref 35.0–45.0)
PH ART: 7.298 — AB (ref 7.350–7.450)
Patient temperature: 96
TCO2: 36 mmol/L (ref 0–100)
pO2, Arterial: 297 mmHg — ABNORMAL HIGH (ref 80.0–100.0)

## 2015-12-28 LAB — CULTURE, BLOOD (ROUTINE X 2)

## 2015-12-28 LAB — METANEPHRINES, PLASMA

## 2015-12-30 LAB — CULTURE, RESPIRATORY W GRAM STAIN: Special Requests: NORMAL

## 2015-12-30 LAB — CULTURE, RESPIRATORY

## 2016-01-17 NOTE — Progress Notes (Signed)
PCCM INTERVAL PROGRESS NOTE  Long discussion with family and friends of patient including wife, brother, physician friend, and cultural leader. Updated them about events of the last 24 hours including cardiac arrest and subsequent apnea testing performed by Dr. Tyson AliasFeinstein this morning which were consistent with brain death. I made it as clear as I could, understanding that this is a delicate situation, that he is no longer alive. Despite his heart beat, he is clinically dead, and is to be removed from the ventilator sometime this afternoon once family has had time to visit. They ask about options and I inform them that they really have no options at this point. They ask about nursing home, for which he would not be a candidate on the basis of brain death. They reject this and do not believe he is dead based on cultural influences. They state that they will never make the decision to remove him from machines, but if this decision is made and carried out by doctors and is out of their hands then they will have no choice to accept it. I made it clear that my goal is to talk with them as much as necessary for them to understand our reasoning, but it is clear that this goal is not achievable. Plan is still for withdrawal of ventilator this afternoon pending CDS evaluation.  Additional Critical Care time described in this note 30 mins.  Joneen RoachPaul Keishon Chavarin, AGACNP-BC [facility] Pager [phone] or [phone]  01/11/2016 12:11 PM

## 2016-01-17 NOTE — Progress Notes (Signed)
Neuro status changes since after code reviewed with bedside RN.  Gag, pupils, and corneal responses relayed to Dr Craige CottaSood in [place].  No new orders.  Will continue to monitor.

## 2016-01-17 NOTE — Progress Notes (Signed)
Spoke with family on multiple occassions throughout the day.  After discussion, decision was made to proceed with withdrawal.  I spoke with Dr. Fabiola Backerabel and Dr. Roda ShuttersXu from neurosurgery and neurology independently about the apnea test.  After our last meeting, the family requested to withdraw now as the patient traditionally had to be buried the same day before sun down.  Disconnected patient from vent, allowed for cardiac death and funeral home called, transfer directly to funeral home for burial.  Additional CC time of 90 min.  Alyson ReedyWesam G. Melizza Kanode, M.D. [facility] Medicine. Pager: [phone]. After hours pager: [phone].

## 2016-01-17 NOTE — Progress Notes (Signed)
PULMONARY / CRITICAL CARE MEDICINE   Name: Austin Mccarty MRN: [id number] DOB: 04/28/1966    ADMISSION DATE:  11/18/2015  CHIEF COMPLAINT:  PEA arrest  HISTORY OF PRESENT ILLNESS:   50 yo male with h/o HTN, HFrEF, CKD, DCM 2/2 HTN who presented 3/26 from home with loss of consciousness.   He arrived in the ED with sats in the 60's, emergently intubated then PEA arrest x4215min then ROSC.  He was found to have a large right sided ICH with intraventricular hemorrhage likely 2/2 HTN.  SUBJECTIVE:    Fever thought to be central, coded rosc 1-2 min Loss of prior neuro relfexes  VITAL SIGNS: Temp:  [96.5 F (35.8 C)-99.4 F (37.4 C)] 96.5 F (35.8 C) (04/10 0300) Pulse Rate:  [35-146] 73 (04/10 0730) Resp:  [13-183] 183 (04/10 0730) BP: (33-240)/(19-109) 172/90 mmHg (04/10 0730) SpO2:  [88 %-100 %] 100 % (04/10 0730) Arterial Line BP: (98-145)/(51-67) 105/53 mmHg (04/10 0600) FiO2 (%):  [30 %-100 %] 100 % (04/10 0730) Weight:  [114.3 kg (251 lb 15.8 oz)] 114.3 kg (251 lb 15.8 oz) (04/10 0500) HEMODYNAMICS:   VENTILATOR SETTINGS: Vent Mode:  [-] PRVC FiO2 (%):  [30 %-100 %] 100 % Set Rate:  [18 bmp-20 bmp] 18 bmp Vt Set:  [550 mL] 550 mL PEEP:  [5 cmH20] 5 cmH20 Pressure Support:  [15 cmH20] 15 cmH20 Plateau Pressure:  [21 cmH20-24 cmH20] 21 cmH20 INTAKE / OUTPUT:  Intake/Output Summary (Last 24 hours) at 01/04/2016 0802 Last data filed at 01/02/2016 0700  Gross per 24 hour  Intake 2369.7 ml  Output   2075 ml  Net  294.7 ml   PHYSICAL EXAMINATION: General:  Unresponsive Integument:  Warm & dry. No rash on exposed skin.  HEENT:  Trach clean, pupils asymmetric rt 56 mm, left 4, blown Cardiovascular:  s1 s2 RRR Pulmonary: CTA, trach wnl Abdomen: Soft. Normal bowel sounds. Nondistended.  Neurological:  Unresponsive, pupils unreactives, no corneal's, no gag, no cough, no dolls eyes, no movement to pain, NOT breathing over rate - exam c/w brain death  LABS:  CBC  Recent  Labs Lab 12/22/15 0438 01/17/2016 0848 12/26/15 1216  WBC 7.4 8.1 16.3*  HGB 9.8* 12.5* 11.7*  HCT 30.9* 39.9 38.3*  PLT 173 225 231   Coag's No results for input(s): APTT, INR in the last 168 hours. BMET  Recent Labs Lab 01/17/2016 0848 12/24/15 0400 12/26/15 1216  NA 153* 153* 153*  K 3.9 3.7 3.9  CL 111 115* 113*  CO2 28 27 27   BUN 33* 40* 42*  CREATININE 1.11 1.27* 1.40*  GLUCOSE 131* 138* 189*   Electrolytes  Recent Labs Lab 12/21/15 0344 12/22/15 0438 01/17/2016 0848 12/24/15 0400 12/26/15 1216  CALCIUM 8.8* 8.6* 9.0 8.8* 8.7*  MG 2.3 2.3 2.4  --   --   PHOS 3.6 4.1 4.4  --   --    Sepsis Markers No results for input(s): LATICACIDVEN, PROCALCITON, O2SATVEN in the last 168 hours. ABG  Recent Labs Lab 12/21/15 0420 01/09/2016 0232 01/04/2016 0730  PHART 7.446 7.441 7.440  PCO2ART 42.1 38.0 42.6  PO2ART 92.9 192* 370*   Liver Enzymes No results for input(s): AST, ALT, ALKPHOS, BILITOT, ALBUMIN in the last 168 hours. Cardiac Enzymes No results for input(s): TROPONINI, PROBNP in the last 168 hours. Glucose  Recent Labs Lab 12/26/15 0745 12/26/15 1226 12/26/15 1539 12/26/15 2019 12/26/15 2139 12/21/2015 0338  GLUCAP 172* 187* 207* 171* 151* 152*   Imaging No results  found. EVENTS: 3/26 - cardiac arrest 3/27 - high dose versed for seizures 3/27 - ventriculostomy 4/6- trach placed, drain clamped 4/6- hydro with clamp 4/10- re arrest  STUDIES: TTE (10/29/15):  EF 30-35% w/ diffuse hypokinesis. No AS or AR. Mild MR but no MS. RV moderately dilated w/ normal systolic function. LA severely dilated & RA mildly dilated. CT HEAD 2:19AM 3/26:  R Thalamic hemorrhage as well as IVH w/ mild mass effect & 2mm right to left midline shift. Possible mild diffuse cerebral edema. Echo 3/27 EF 40% MRI 3/31>>>Diffuse brain damage. eeg 4/5>>>Abnormal routine inpatient EEG suggestive of severe anoxic ischemic encephalopathy based on severe background suppression as  described CT head 4/6>>>new enlarged ventricles, diffuse edema  MICROBIOLOGY: Blood Ctx x2 3/26>>>NEG Urine Ctx 3/26>>>neg Tracheal Asp Ctx 3/26>>>neg Influenza PCR 3/26>>>NEG Respiratory Viral Panel PCR 3/26>>>NOT SENT Urine Strep Ag 3/26>>>NEG Urine Legionella Ag 3/26>>>NEG MRSA PCR 3/26:  Negative  Tracheal aspirate 4/8>>>  ANTIBIOTICS: Vancomycin 3/26>>>3/31, 4/8>>> Zosyn 3/26>>>4/4, 4/8>>>  LINES/TUBES: OETT 7.5 3/26>>>4/6 Trach (jy) 4/6>>> OGT 3/26>>>4/6 FOLEY 3/26>>> 3/27 vstomy >>4/7 planned  ASSESSMENT / PLAN:  NEUROLOGIC A:   Horrible prognosis Comatose s/p Right sided ICH - Sub-falcine herniation. Intraventricular Hemorrhage Status epilepticus 4/10 brain death on examination P:   Drain removed 4/7, NS signed off  NOT a candidate for shunt, will not change outcome Continue Keppra & valproate IV Supportive care  Exam c/w brain death - will apnea test this am, if unable will tcd vs eeg vs nuc  PULMONARY A: Acute Hypoxic/ hypercarbic  Respiratory Failure - Secondary to pulmonary edema and comatose state. Pulmonary Edema - Cardiogenic vs Neurogenic P:   abg reviewed, rate to 18, to 100%, get abg for apnea No sbt of course  CARDIOVASCULAR A:  Possible Acute Systolic CHF Exacerbation - EF 30-35% 10/2015. Hypertensive Emergency - resolved Shock post arrest - herniation likely P:  Dc coreg, clonidine patch, hydralazine, losartan Hold labetolol Levophed to map 60, NO INCREASE WILL NOT CHANGE OUTCOME, will be futile No addition of pressors cpr would be futile  RENAL A:   Acute on Chronic Renal Failure  hypenatremia P:   Allowing hypernatremia will not chang eoutcome  GASTROINTESTINAL A:   S/p peg P: Protonix IV daily. TF hold  HEMATOLOGIC A:   Leukocytosis - resolved P:  Limit phlebotomy SCDs.  INFECTIOUS A:   Possible CAP Possible UTI Fever -- ?central  P:   abx restarted 4/9 for fever 103, suspect central  Follow cultures    ENDOCRINE A:   Hyperglycemia - No h/o DM. P:   SSI per algorithm.   Prognosis is horrible for any meaningfully recovery Heroics, ACLS, any escalation would be futile and medically ineffective. Exam c/w brain death - will apnea and call family  Ccm time 45 min  Mcarthur Rossetti. Tyson Alias, MD, FACP Pgr: [phone] [place] [facility]

## 2016-01-17 NOTE — Progress Notes (Signed)
Asystole noted on the monitor. Two nurses, Thomes CakeMari Lahari Suttles and ParisKimberly Meyran auscultated and heard no heart rhythm. Time of brain death was 0856. Time of cardiac death 1400. Family support given.

## 2016-01-17 NOTE — Progress Notes (Signed)
Patient had presented bradycardic and after entering room was hypotensive.  Rapid response and [facility]link were called.  As well family with brother and wife were contacted to return to the hospital with his condition worsening.  A code blue @ 2328 on 12/26/15 (see code sheet). After several discussions with the family the patient was transferred back to ICU.

## 2016-01-17 NOTE — Progress Notes (Signed)
[facility] notified of apnea test results and pronounced death by Dr. Tyson AliasFeinstein at [phone]. Will continue to monitor closely.

## 2016-01-17 NOTE — Progress Notes (Signed)
Called to bedside at 2325 for bradycardia and hypotension. Patient with history of large R ICH s/p drain placement and subsequent removal with terrible prognosis with regards to neurologic recovery - comatose with minimal brainstem reflexes present. Upon my arrival to bedside, atropine was being given for bradycardia with minimal response. Shortly thereafter, pulses were no longer palpable and CPR was initiated. Epinephrine was given x 1 with ROSC. Family was notified and, despite their voicing understanding of his prognosis, he was to remain a full code. He continued to have a pulse, but blood pressures remained quite low with MAPs in the 40s. No further interventions were undertaken while awaiting decisions from the family. After arrival of his wife, brother, several other family members and one of their spiritual leaders and after much discussion regarding his prognosis, the family stated that they desired continued aggressive care. A left femoral arterial line was placed and he was started on pressors for blood pressure support.   Exam: comatose, synchronous with vent, trach, PEG, no spontaneous eye opening, no response to noxious stimuli, breath sounds coarse bilaterally, heart tones s1, s2, regular, abdomen soft, extremities edematous.    Plan: Pressors Move to ICU Continued antibiosis  Continue ventilatory support  The patient is critically ill with multiple organ system failure and requires high complexity decision making for assessment and support, frequent evaluation and titration of therapies, advanced monitoring, review of radiographic studies and interpretation of complex data.   Critical Care Time devoted to patient care services, exclusive of separately billable procedures, described in this note is 120 minutes.    Nita SickleSarah Ellen E. Alexas Basulto, MD Pulmonary and Critical Care 12/15/2015 2:21 AM

## 2016-01-17 NOTE — Progress Notes (Signed)
Patient ID: Austin Mccarty, male   DOB: 08/04/1966, 50 y.o.   MRN: [id number]  BP 128/73 mmHg  Pulse 63  Temp(Src) 96.5 F (35.8 C) (Core (Comment))  Resp 18  Ht 6\' 1"  (1.854 m)  Wt 114.3 kg (251 lb 15.8 oz)  BMI 33.25 kg/m2  SpO2 100% Comatose No corneal reflexes bilaterally No oculocephalics No pupillary response to light, pupils fixed and dilated 6-617mm No cough, no gag No response to cold caloric testing No response to noxious peripheral stimuli Apnea test done already Mr. Austin Mccarty is clinically brain dead, core temp is greater than 32 degrees, no sedation is present. Time is 1327

## 2016-01-17 NOTE — Procedures (Addendum)
Apnea testing D/w brother Exam prior c/w brain death  ABg prior 7.44/42/370 Vent off, 10 liters applied to carina NO spont breathing for 8 min  ABG follow up 7.31/66/297 at 6 min   There is an 8 min also 7.29/67.297 All meet criteria brain death  Time of death 8:56 am Will notify wife and brother Will also order dnr in meantime Organ donor to [facility]  tolerated  Mcarthur Rossettianiel J. Tyson AliasFeinstein, MD, FACP Pgr: [phone] [place] [facility]

## 2016-01-17 NOTE — Procedures (Signed)
Arterial Catheter Insertion Procedure Note Maudry DiegoMohialdin H. Ohanesian [id number] 05/02/1966  Procedure: Insertion of Arterial Catheter  Indications: Blood pressure monitoring  Procedure Details Consent: Risks of procedure as well as the alternatives and risks of each were explained to the (patient/caregiver).  Consent for procedure obtained. Time Out: Verified patient identification, verified procedure, site/side was marked, verified correct patient position, special equipment/implants available, medications/allergies/relevent history reviewed, required imaging and test results available.  Performed  Maximum sterile technique was used including antiseptics, cap, gloves, gown, hand hygiene, mask and sheet. Skin prep: Chlorhexidine; local anesthetic administered 20 gauge catheter was inserted into right femoral artery using the Seldinger technique.  Evaluation Blood flow good; BP tracing good. Complications: No apparent complications.  R femoral site selected 2/2 profound hypotension, but palpable femoral pulse and need for emergent placement without ultrasound. First attempt venous return through needle. 2nd attempt arterial return - wire threaded, needle removed, catheter threaded, good wave form noted. Line sutured and dressed. No complications.   Nita SickleSarah Ellen E. Stephens, MD Pulmonary and Critical Care 12/22/2015 2:20 AM

## 2016-01-17 NOTE — Clinical Social Work Note (Signed)
Clinical Social Worker continuing to follow patient and family for support.  Patient has been pronounced brain dead and family has been made aware.  CSW spoke with RN who states that family is appropriately coping at this time.  Patient family with a lot of support at bedside.  CSW remains available for support to patient family and staff as needed.  Macario GoldsJesse Cortasia Screws, Kentucky[place]CS[place] [phone]

## 2016-01-17 NOTE — Progress Notes (Signed)
Patient removed from ventilator per MD request. Family and MD at bedside.

## 2016-01-17 NOTE — Progress Notes (Signed)
Pt had gag reflex with suctioning at the start of the shift. After the pt coded, no gag reflex has been present.

## 2016-01-17 DEATH — deceased

## 2016-02-17 NOTE — Discharge Summary (Signed)
Austin Mccarty:  Austin Mccarty, Austin Mccarty             ACCOUNT NO.:  [id number]  MEDICAL RECORD NO.:  [id number]  LOCATION:  [id number]                        FACILITY:  [facility]  PHYSICIAN:  Nelda Bucksaniel J Dorathy Stallone, MD DATE OF BIRTH:  Jan 23, 1966  DATE OF ADMISSION:  11/25/2015 DATE OF DISCHARGE:  10-12-15                              DISCHARGE SUMMARY   DEATH SUMMARY  This is a 50 year old, unfortunate male, history of hypertension, heart failure, CKD, diabetes, hypertension who presented on December 12, 2015, with loss of consciousness.  He arrived to the ED.  Sats in the 60s, emergently intubated and PEA arrest x15 minutes with return of spontaneous circulation.  He was found to have enlarged massive right- sided angiogram as well as intraventricular hemorrhage likely secondary to hypertension.  The patient had a prolonged hospitalization with continued worsening neurologic progress.  He had a drain placed for hydro by Neurosurgery.  He had __________ throughout hospitalization and tracheostomy was placed on December 23, 2015.  The drain was clamped and he continued to have hydro with clamping based on his horrible neurologic examination and prognosis for functional recovery with assistance of Neurology and Neurosurgery.  The patient was deemed not to be a candidate for shunt placement.  To note, he had an echocardiogram on __________ which showed an ejection fraction of 30-35%.  Again, he had __________ echocardiogram repeat on December 13, 2015, EF was 40%.  MRI of the brain at 3:31 with diffuse brain damage.  EEG was suggestive on December 22, 2015 of severe anoxic brain injury.  CT scan confirmed __________ clamping of the tracheostomy with decreased edema and enlargement of the ventricles and again __________ not to be a candidate for shunt placement.  The patient was transferred out of intensive care unit __________ tracheostomy had not been brain dead at that time and suffered a recurrent arrest and likely  __________ given my brain death and the patient had an apnea testing done which confirmed brain death diagnosis and the patient was declared dead.  Family dynamics were difficult with some cultural discrepancies and how to handle patients brain death.  They have worked through with Dr. Molli KnockYacoub and the ventilator machine of course, was withdrawn because the patient had been declared brain dead.  FINAL DIAGNOSIS UPON DEATH: 1. Massive intracranial hemorrhage. 2. Hydrocephalus. 3. Acute respiratory failure requiring tracheostomy. 4. Hypertension. 5. Severe anoxic brain injury status post cardiac arrest.     Nelda Bucksaniel J Terri Malerba, MD     DJF/MEDQ  D:  01/21/2016  T:  01/22/2016  Job:  [id number]

## 2017-05-31 IMAGING — CT CT HEAD W/O CM
1 series · 15 of 30 positions shown, 19 images · non-contrast
Comparison: CT head without contrast [REDACTED].

CLINICAL DATA: Hemorrhagic stroke.  Hypertension.

EXAM:
CT HEAD WITHOUT CONTRAST
TECHNIQUE: Contiguous axial images were obtained from the base of the skull
through the vertex without intravenous contrast.

[Series 2: head 5.0 h30s · axial · 0.46mm/px · z∈[-141,-6]mm · 15 of 31 slices shown, 19 images]
[im 2/31  brain]
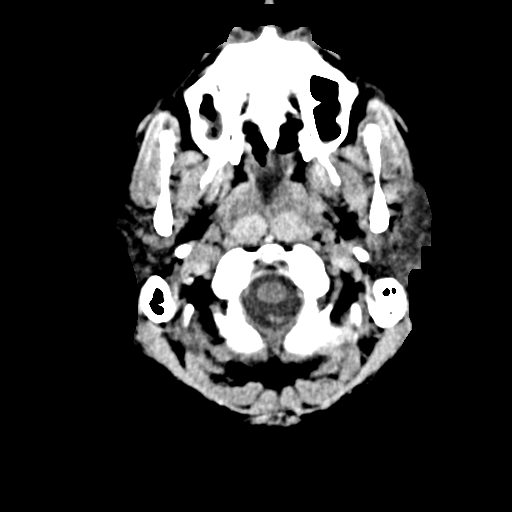
[im 2/31  bone]
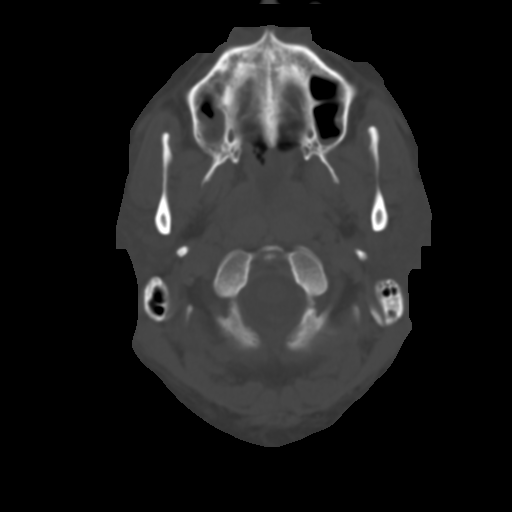
[im 4/31  brain]
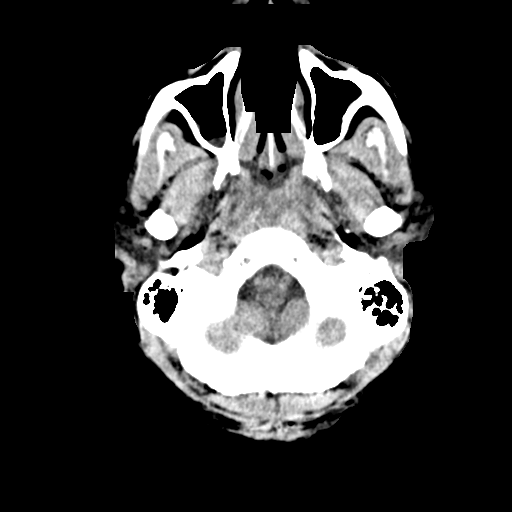
[im 6/31  brain]
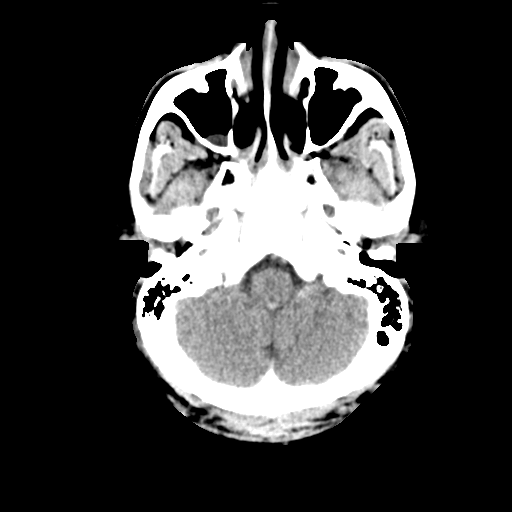
[im 8/31  brain]
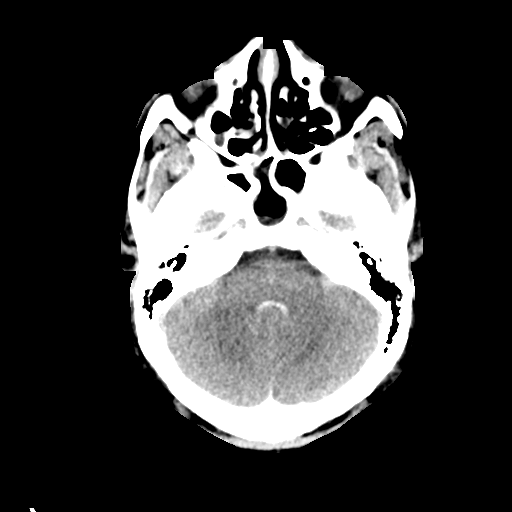
[im 10/31  brain]
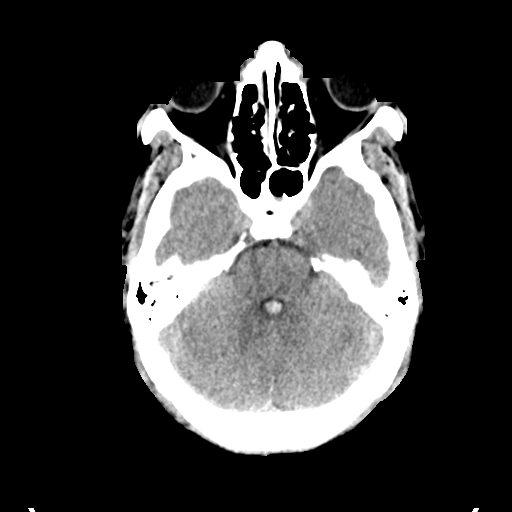
[im 10/31  bone]
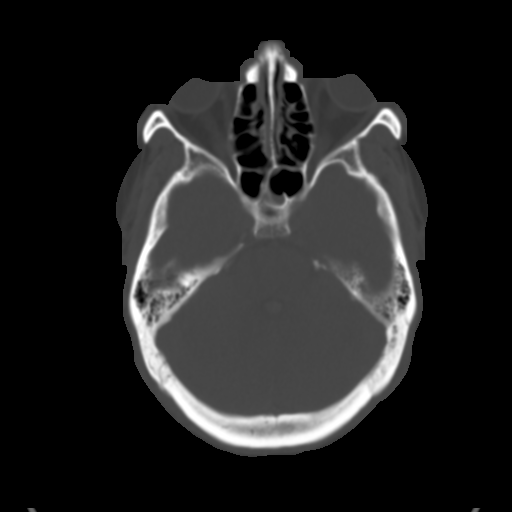
[im 12/31  brain]
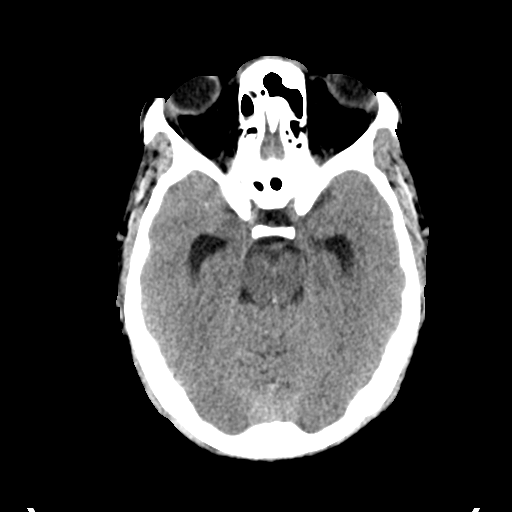
[im 14/31  brain]
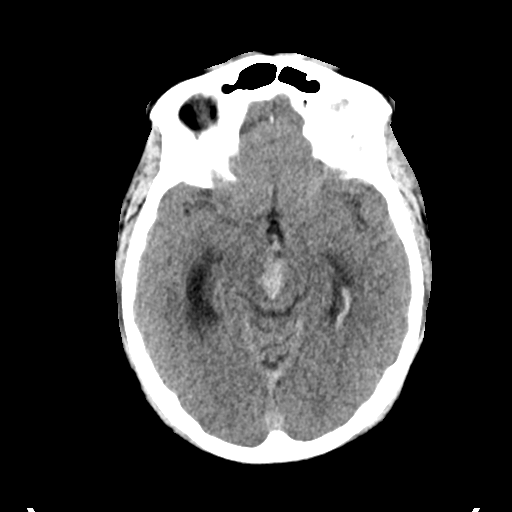
[im 16/31  brain]
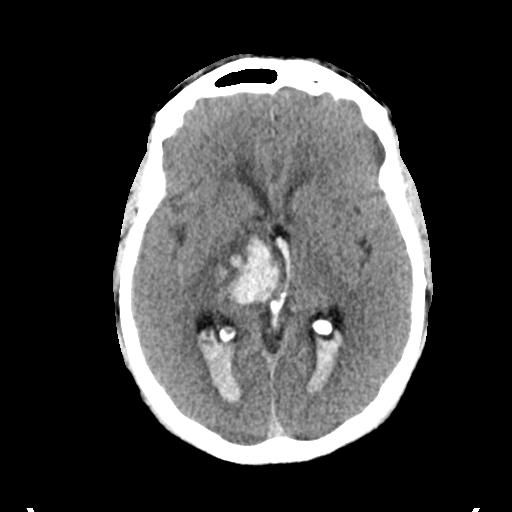
[im 17/31  brain]
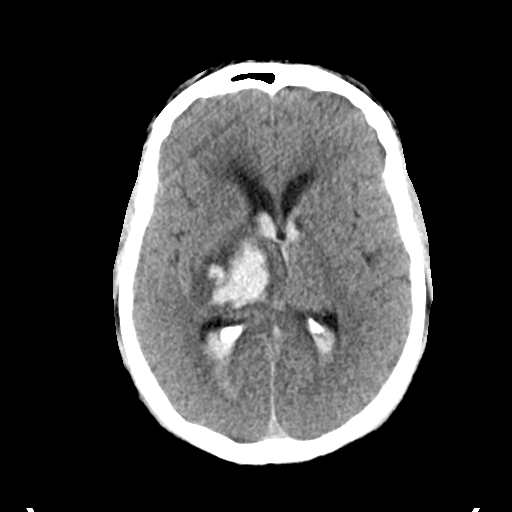
[im 17/31  bone]
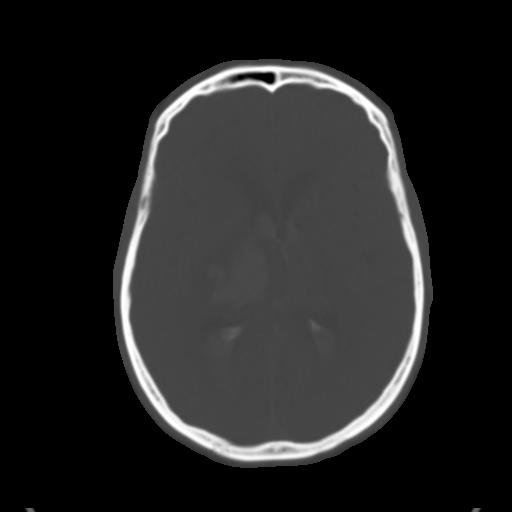
[im 19/31  brain]
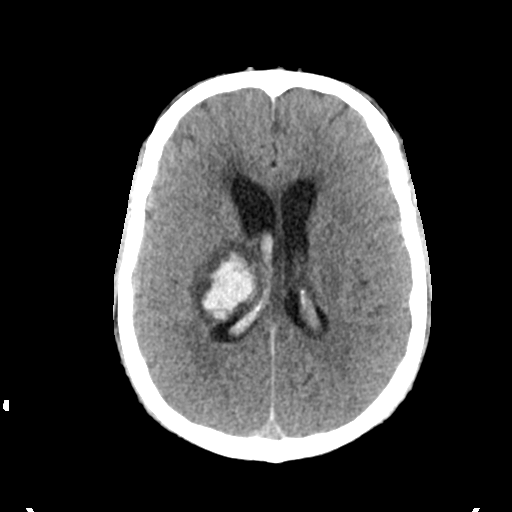
[im 21/31  brain]
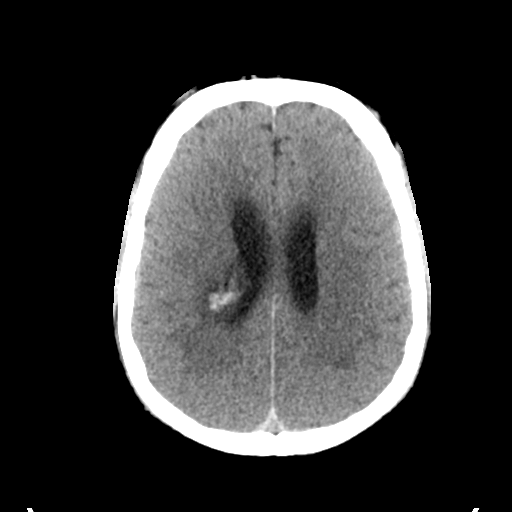
[im 23/31  brain]
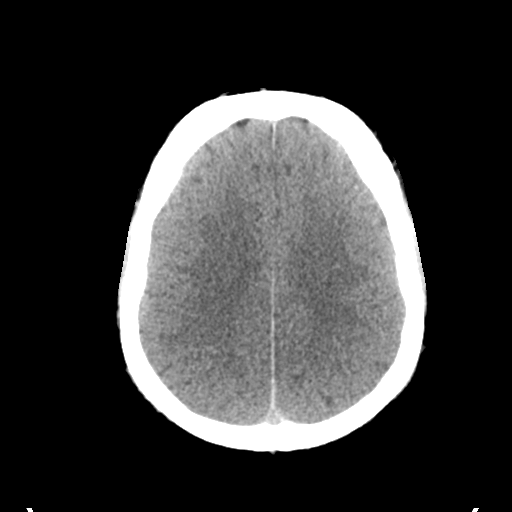
[im 25/31  brain]
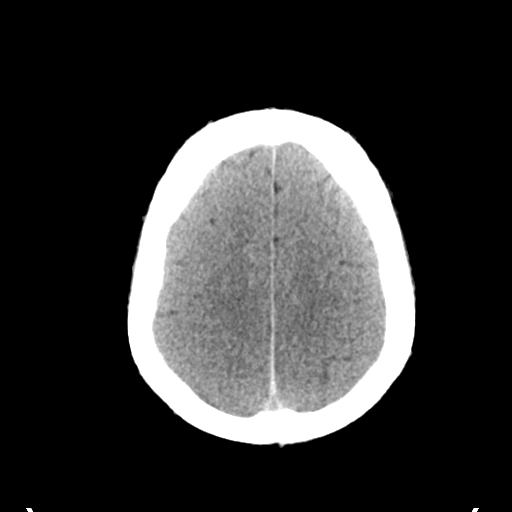
[im 25/31  bone]
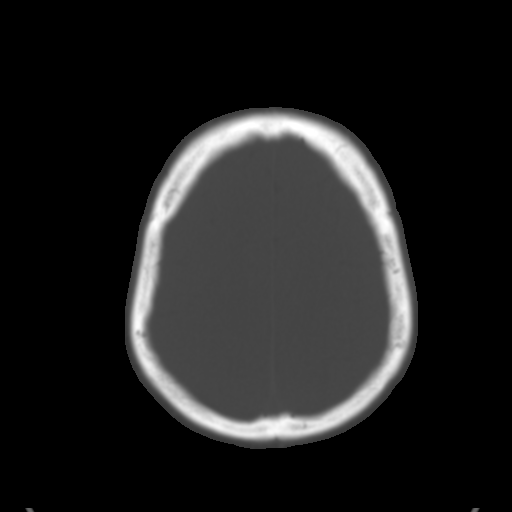
[im 27/31  brain]
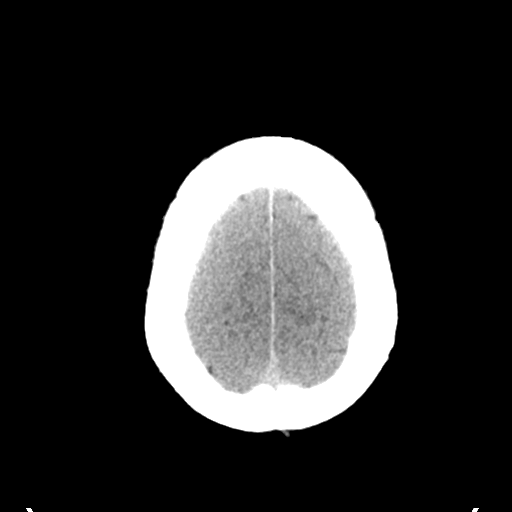
[im 29/31  brain]
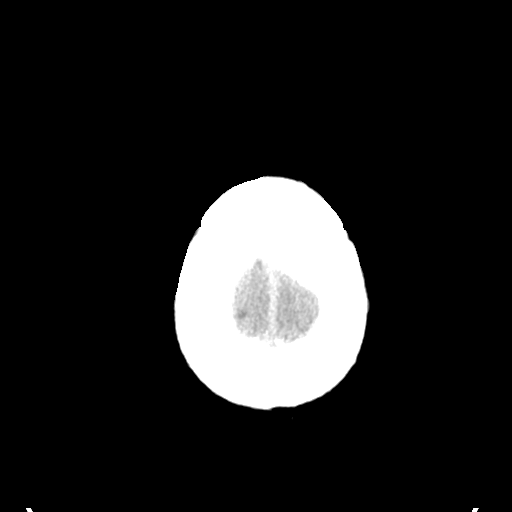

[15 of 30 positions shown; findings below may reference images not displayed]

FINDINGS: The right basal ganglia hemorrhage is similar in size, measuring
x 2.5 cm. It extends into both ventricles. There is layering blood
within the posterior horns of the lateral ventricles but laterally.
Hemorrhage is present in the third and fourth ventricles. The
temporal tips are more prominent on today's study suggesting
developing hydrocephalus. Midline shift is stable to slightly
increased, now measuring 7 mm. Partial effacement of the basal
cisterns is stable.

No new hemorrhage is present. A fluid level is present in the right
maxillary sinus and sphenoid sinuses. Mild diffuse mucosal
thickening is present in the ethmoid air cells and frontal sinuses.
The mastoid air cells are clear. The globes and orbits are intact.
The calvarium is unremarkable.
IMPRESSION: 1. Stable size of right basal ganglia hemorrhage.
2. Slight increase in midline shift, now measuring 7 mm.
3. Increased prominence of the temporal horns of the lateral
ventricles compatible with developing hydrocephalus.
These results were called by telephone at the time of interpretation
on 12/13/2015 at [DATE] to Dr. Mini, who verbally acknowledged these
results.

## 2017-06-04 IMAGING — MR MR MRA HEAD W/O CM
9 of 11 series · 32 of 48 positions shown · non-contrast
Comparison: Head CT from 2 days ago

CLINICAL DATA: Anoxic brain injury. Intra cerebral hemorrhage.
Cardiac arrest.

EXAM:
MRI HEAD WITHOUT CONTRAST
MRA HEAD WITHOUT CONTRAST
TECHNIQUE: Multiplanar, multiecho pulse sequences of the brain and surrounding
structures were obtained without intravenous contrast. Angiographic
images of the head were obtained using MRA technique without
contrast.

[Series 3: DWI · axial · 3.0mm · 1.09mm/px · z∈[-34,+102]mm · 7 of 94 slices shown (1 of 4)]
[im 1/94]
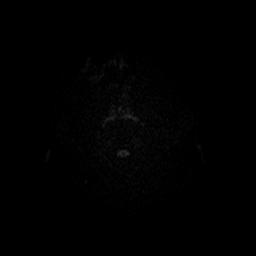
[im 16/94]
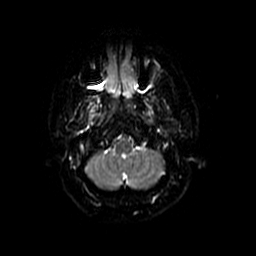
[im 32/94]
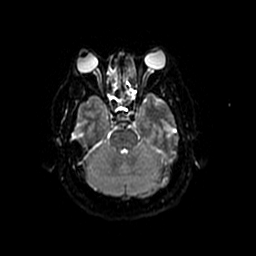
[im 47/94]
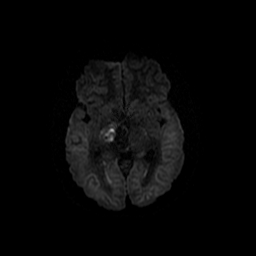
[im 63/94]
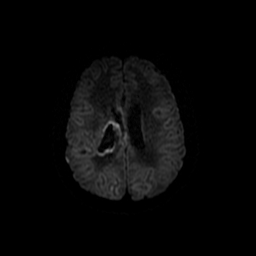
[im 78/94]
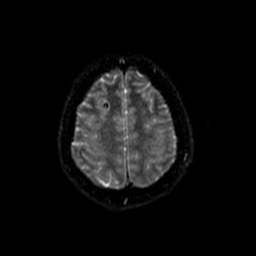
[im 94/94]
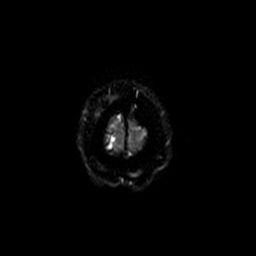

[Series 4: DWI · coronal · 5.0mm · 1.09mm/px · 5 of 66 slices shown (2 of 4)]
[im 1/66]
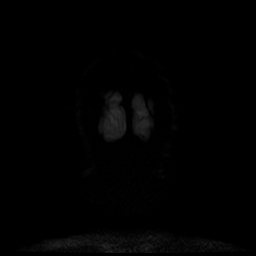
[im 17/66]
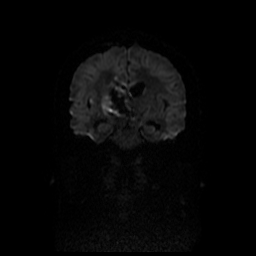
[im 33/66]
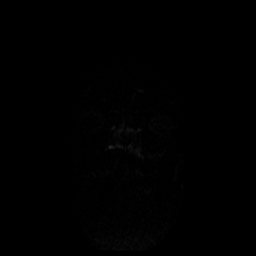
[im 49/66]
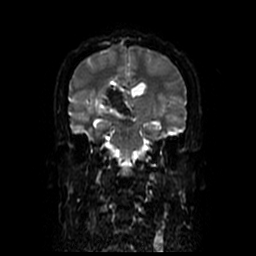
[im 66/66]
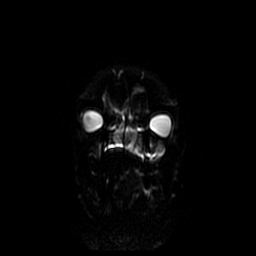

[Series 5: (id) mt fs · axial · 1.4mm · 0.43mm/px · z∈[-41,+15]mm · 5 of 136 slices shown]
[im 1/136]
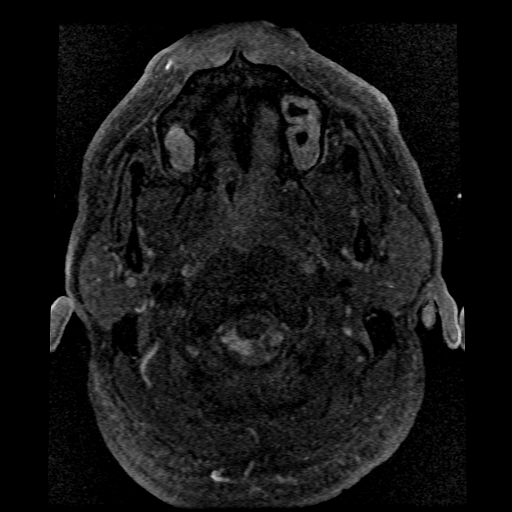
[im 28/136]
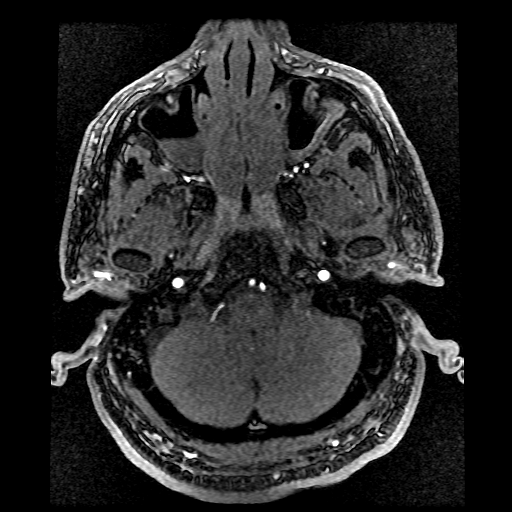
[im 41/136]
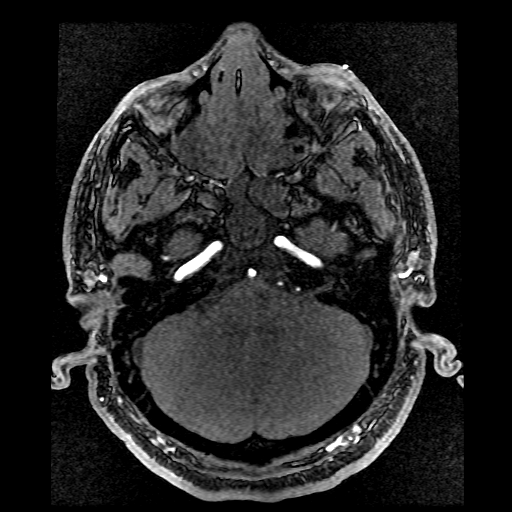
[im 55/136]
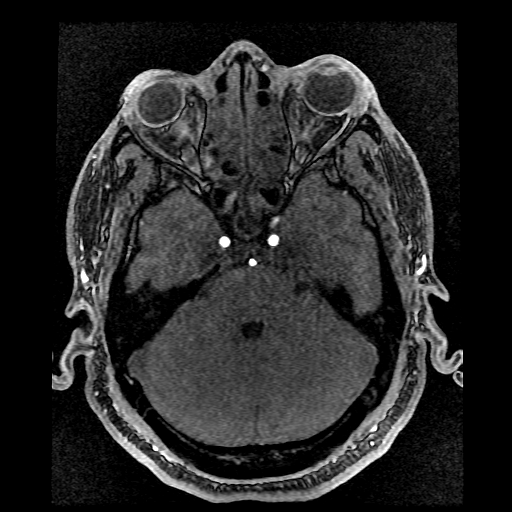
[im 82/136]
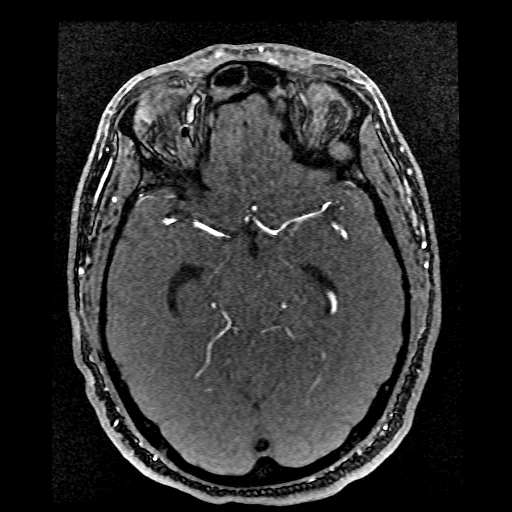

[Series 6: T1 · sagittal · 5.0mm · 0.47mm/px · 2 of 23 slices shown]
[im 1/23]
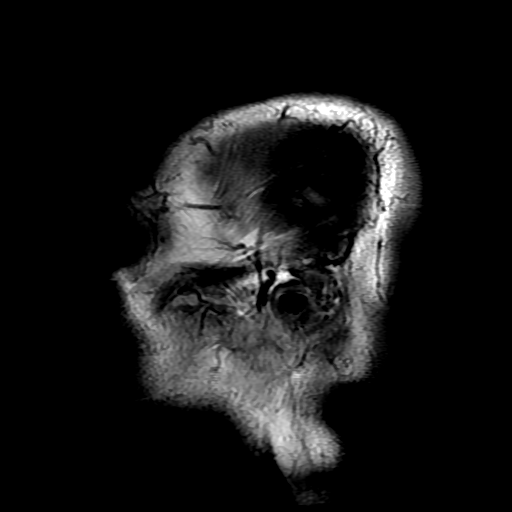
[im 23/23]
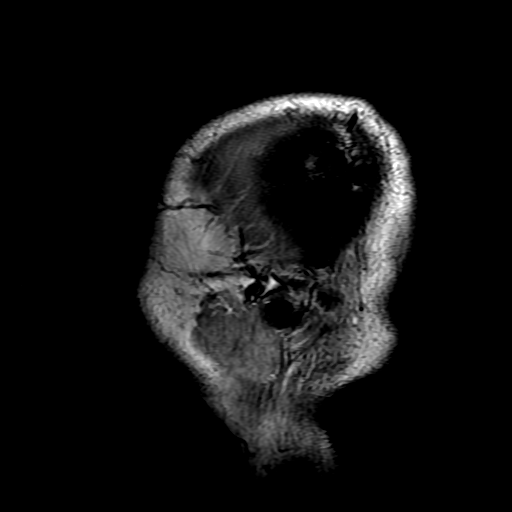

[Series 7: T2 · axial · 5.0mm · 0.47mm/px · z∈[-49,+99]mm · 2 of 26 slices shown (1 of 2)]
[im 1/26]
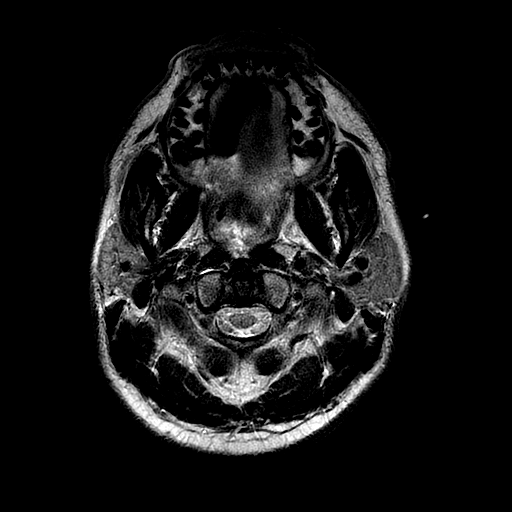
[im 26/26]
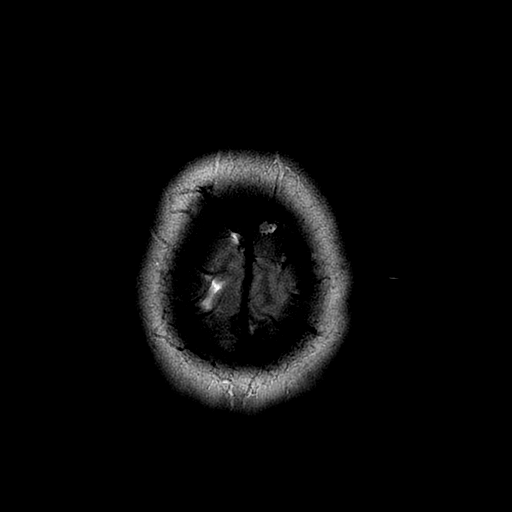

[Series 8: FLAIR · axial · 5.0mm · 0.47mm/px · z∈[-49,+99]mm · 2 of 26 slices shown]
[im 1/26]
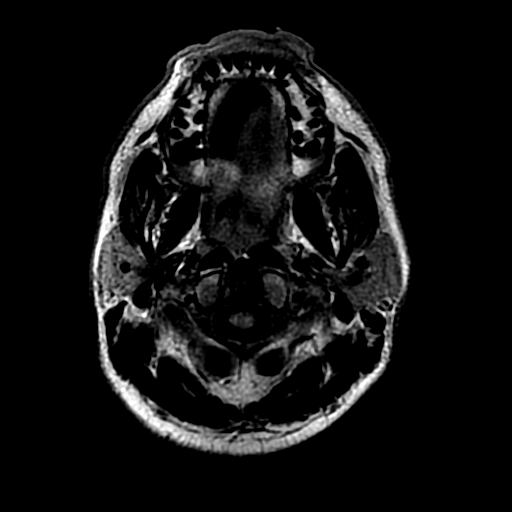
[im 26/26]
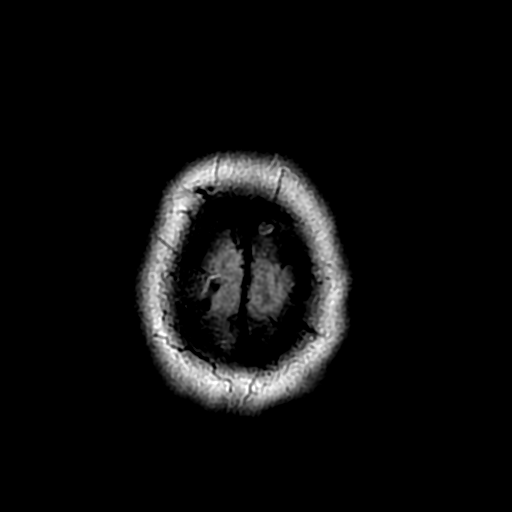

[Series 11: T2 · coronal · 5.0mm · 0.43mm/px · 2 of 28 slices shown (2 of 2)]
[im 1/28]
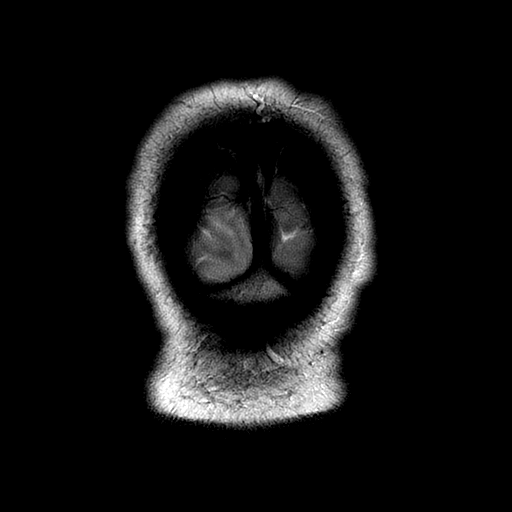
[im 28/28]
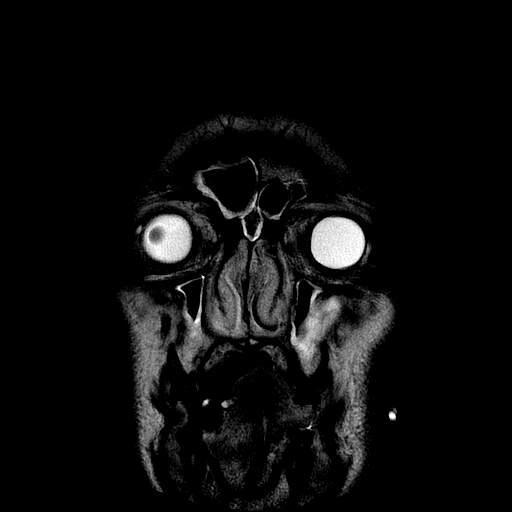

[Series 300: DWI · axial · 3.0mm · 1.09mm/px · z∈[-34,+102]mm · 4 of 47 slices shown (3 of 4)]
[im 1/47]
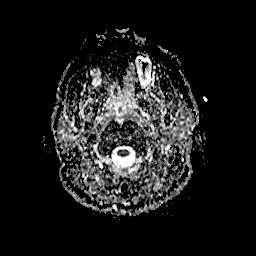
[im 16/47]
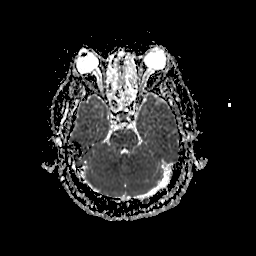
[im 31/47]
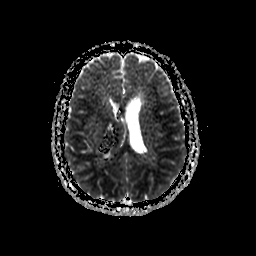
[im 47/47]
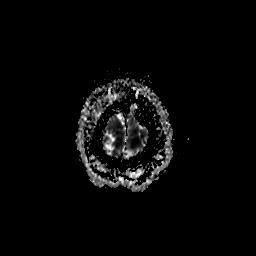

[Series 400: DWI · coronal · 5.0mm · 1.09mm/px · 3 of 33 slices shown (4 of 4)]
[im 1/33]
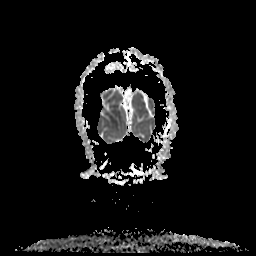
[im 17/33]
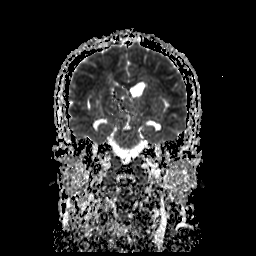
[im 33/33]
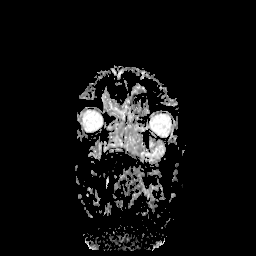

[32 of 48 positions shown; findings below may reference images not displayed]

FINDINGS: MRI HEAD FINDINGS

Calvarium and upper cervical spine: Unremarkable right frontal burr
hole for ventriculostomy.

Orbits: Negative.

Sinuses and Mastoids: Diffuse sinusitis with marked mucosal
thickening and scattered fluid levels. Bilateral mastoid effusion.

Brain: Right thalamic hematoma has similar dimensions to prior head
CT, measuring up to 36 mm. Peripheral T1 appearance is compatible
with methemoglobin. Stable casting of the lateral ventricles with
clot. Ventriculostomy from the right with tip in the low right
frontal horn of the lateral ventricle. Stable dimensions of the
lateral ventricles, with preferential drainage on the right.
Vasogenic edema surrounds the hematoma.

Symmetric diffusion and T2 hyperintense cortex, especially along the
posterior frontal cortex and occipital lobes. There is speckled
restricted diffusion in the caudate nuclei. Pattern compatible with
history of global anoxic injury. No discrete vascular territory
infarct. No shift or herniation. Swelling is symmetric.

MRA HEAD FINDINGS

Symmetric carotid and vertebral arteries. Intracranial flow is
present. No indication of vascular malformation or aneurysm as cause
of the parenchymal hemorrhage. No major branch occlusion or flow
limiting stenosis.
IMPRESSION: 1. Bilateral cortical edema/infarction and small caudate infarcts
compatible with global anoxic injury.
2. Size stable right thalamic and intraventricular hemorrhage. No
MRA finding to explain the hematoma.
3. Right frontal shunt with stable ventricular volume and
preferential right lateral ventricle drainage.
4. Progressive sinusitis and mastoiditis.
